# Patient Record
Sex: Female | Born: 1950 | Race: White | Hispanic: No | Marital: Married | Smoking: Former smoker
Health system: Southern US, Community
[De-identification: ages and names within clinical notes are randomized; demographics above are authoritative.]

## PROBLEM LIST (undated history)

## (undated) DIAGNOSIS — D649 Anemia, unspecified: Secondary | ICD-10-CM

## (undated) DIAGNOSIS — M869 Osteomyelitis, unspecified: Secondary | ICD-10-CM

## (undated) DIAGNOSIS — R011 Cardiac murmur, unspecified: Secondary | ICD-10-CM

## (undated) DIAGNOSIS — M1990 Unspecified osteoarthritis, unspecified site: Secondary | ICD-10-CM

## (undated) DIAGNOSIS — Z8489 Family history of other specified conditions: Secondary | ICD-10-CM

## (undated) DIAGNOSIS — I10 Essential (primary) hypertension: Secondary | ICD-10-CM

## (undated) DIAGNOSIS — G43909 Migraine, unspecified, not intractable, without status migrainosus: Secondary | ICD-10-CM

## (undated) DIAGNOSIS — M797 Fibromyalgia: Secondary | ICD-10-CM

## (undated) DIAGNOSIS — M51369 Other intervertebral disc degeneration, lumbar region without mention of lumbar back pain or lower extremity pain: Secondary | ICD-10-CM

## (undated) DIAGNOSIS — R7303 Prediabetes: Secondary | ICD-10-CM

## (undated) DIAGNOSIS — F429 Obsessive-compulsive disorder, unspecified: Secondary | ICD-10-CM

## (undated) DIAGNOSIS — K219 Gastro-esophageal reflux disease without esophagitis: Secondary | ICD-10-CM

## (undated) DIAGNOSIS — M5136 Other intervertebral disc degeneration, lumbar region: Secondary | ICD-10-CM

## (undated) HISTORY — DX: Fibromyalgia: M79.7

## (undated) HISTORY — PX: CATARACT EXTRACTION: SUR2

## (undated) HISTORY — PX: KNEE ARTHROSCOPY W/ MENISCAL REPAIR: SHX1877

## (undated) HISTORY — DX: Anemia, unspecified: D64.9

## (undated) HISTORY — PX: RADIOFREQUENCY ABLATION NERVES: SUR1070

## (undated) HISTORY — DX: Other intervertebral disc degeneration, lumbar region: M51.36

## (undated) HISTORY — DX: Migraine, unspecified, not intractable, without status migrainosus: G43.909

## (undated) HISTORY — DX: Unspecified osteoarthritis, unspecified site: M19.90

## (undated) HISTORY — DX: Other intervertebral disc degeneration, lumbar region without mention of lumbar back pain or lower extremity pain: M51.369

---

## 1972-07-20 HISTORY — PX: TONSILLECTOMY: SUR1361

## 1993-07-20 HISTORY — PX: BREAST BIOPSY: SHX20

## 1998-02-12 ENCOUNTER — Other Ambulatory Visit: Admission: RE | Admit: 1998-02-12 | Discharge: 1998-02-12 | Payer: Self-pay | Admitting: Gynecology

## 1999-02-25 ENCOUNTER — Other Ambulatory Visit: Admission: RE | Admit: 1999-02-25 | Discharge: 1999-02-25 | Payer: Self-pay | Admitting: Gynecology

## 2000-02-26 ENCOUNTER — Other Ambulatory Visit: Admission: RE | Admit: 2000-02-26 | Discharge: 2000-02-26 | Payer: Self-pay | Admitting: Gynecology

## 2001-02-08 ENCOUNTER — Other Ambulatory Visit: Admission: RE | Admit: 2001-02-08 | Discharge: 2001-02-08 | Payer: Self-pay | Admitting: Gynecology

## 2001-06-10 ENCOUNTER — Ambulatory Visit (HOSPITAL_COMMUNITY): Admission: RE | Admit: 2001-06-10 | Discharge: 2001-06-10 | Payer: Self-pay | Admitting: Pulmonary Disease

## 2001-11-25 ENCOUNTER — Emergency Department (HOSPITAL_COMMUNITY): Admission: EM | Admit: 2001-11-25 | Discharge: 2001-11-26 | Payer: Self-pay

## 2002-02-23 ENCOUNTER — Other Ambulatory Visit: Admission: RE | Admit: 2002-02-23 | Discharge: 2002-02-23 | Payer: Self-pay | Admitting: Obstetrics and Gynecology

## 2003-03-01 ENCOUNTER — Other Ambulatory Visit: Admission: RE | Admit: 2003-03-01 | Discharge: 2003-03-01 | Payer: Self-pay | Admitting: Gynecology

## 2004-03-13 ENCOUNTER — Other Ambulatory Visit: Admission: RE | Admit: 2004-03-13 | Discharge: 2004-03-13 | Payer: Self-pay | Admitting: Gynecology

## 2004-04-11 ENCOUNTER — Ambulatory Visit (HOSPITAL_COMMUNITY): Admission: RE | Admit: 2004-04-11 | Discharge: 2004-04-11 | Payer: Self-pay | Admitting: Pulmonary Disease

## 2004-04-21 ENCOUNTER — Ambulatory Visit (HOSPITAL_COMMUNITY): Admission: RE | Admit: 2004-04-21 | Discharge: 2004-04-21 | Payer: Self-pay | Admitting: Pulmonary Disease

## 2004-06-26 ENCOUNTER — Ambulatory Visit (HOSPITAL_COMMUNITY): Admission: RE | Admit: 2004-06-26 | Discharge: 2004-06-26 | Payer: Self-pay | Admitting: General Surgery

## 2004-07-08 ENCOUNTER — Encounter (HOSPITAL_COMMUNITY): Admission: RE | Admit: 2004-07-08 | Discharge: 2004-08-07 | Payer: Self-pay | Admitting: Neurosurgery

## 2008-04-23 ENCOUNTER — Ambulatory Visit (HOSPITAL_COMMUNITY): Admission: RE | Admit: 2008-04-23 | Discharge: 2008-04-23 | Payer: Self-pay | Admitting: Gynecology

## 2010-08-11 ENCOUNTER — Encounter: Payer: Self-pay | Admitting: Gynecology

## 2011-02-09 ENCOUNTER — Other Ambulatory Visit (HOSPITAL_COMMUNITY): Payer: Self-pay | Admitting: Gynecology

## 2011-02-09 DIAGNOSIS — Z139 Encounter for screening, unspecified: Secondary | ICD-10-CM

## 2011-02-16 ENCOUNTER — Ambulatory Visit (HOSPITAL_COMMUNITY)
Admission: RE | Admit: 2011-02-16 | Discharge: 2011-02-16 | Disposition: A | Discharge status: Home / Self Care | Payer: BC Managed Care – PPO | Source: Ambulatory Visit | Attending: Gynecology | Admitting: Gynecology

## 2011-02-16 DIAGNOSIS — Z139 Encounter for screening, unspecified: Secondary | ICD-10-CM

## 2011-02-16 DIAGNOSIS — Z1231 Encounter for screening mammogram for malignant neoplasm of breast: Secondary | ICD-10-CM | POA: Insufficient documentation

## 2015-04-22 ENCOUNTER — Other Ambulatory Visit (HOSPITAL_COMMUNITY): Payer: Self-pay | Admitting: Pulmonary Disease

## 2015-04-22 DIAGNOSIS — Z1231 Encounter for screening mammogram for malignant neoplasm of breast: Secondary | ICD-10-CM

## 2015-05-06 ENCOUNTER — Ambulatory Visit (HOSPITAL_COMMUNITY): Payer: Self-pay

## 2015-05-13 ENCOUNTER — Ambulatory Visit (HOSPITAL_COMMUNITY): Payer: Self-pay

## 2015-05-20 ENCOUNTER — Ambulatory Visit (HOSPITAL_COMMUNITY)
Admission: RE | Admit: 2015-05-20 | Discharge: 2015-05-20 | Disposition: A | Discharge status: Home / Self Care | Payer: BC Managed Care – PPO | Source: Ambulatory Visit | Attending: Pulmonary Disease | Admitting: Pulmonary Disease

## 2015-05-20 DIAGNOSIS — Z1231 Encounter for screening mammogram for malignant neoplasm of breast: Secondary | ICD-10-CM | POA: Diagnosis present

## 2016-07-08 ENCOUNTER — Other Ambulatory Visit (HOSPITAL_COMMUNITY): Payer: Self-pay | Admitting: Pulmonary Disease

## 2016-07-08 DIAGNOSIS — M5440 Lumbago with sciatica, unspecified side: Secondary | ICD-10-CM

## 2016-07-10 ENCOUNTER — Ambulatory Visit (HOSPITAL_COMMUNITY)
Admission: RE | Admit: 2016-07-10 | Discharge: 2016-07-10 | Disposition: A | Discharge status: Home / Self Care | Payer: BC Managed Care – PPO | Source: Ambulatory Visit | Attending: Pulmonary Disease | Admitting: Pulmonary Disease

## 2016-07-10 DIAGNOSIS — M545 Low back pain: Secondary | ICD-10-CM | POA: Diagnosis present

## 2016-07-10 DIAGNOSIS — M7138 Other bursal cyst, other site: Secondary | ICD-10-CM | POA: Insufficient documentation

## 2016-07-10 DIAGNOSIS — M5127 Other intervertebral disc displacement, lumbosacral region: Secondary | ICD-10-CM | POA: Insufficient documentation

## 2016-07-10 DIAGNOSIS — M4317 Spondylolisthesis, lumbosacral region: Secondary | ICD-10-CM | POA: Diagnosis not present

## 2016-07-10 DIAGNOSIS — M5440 Lumbago with sciatica, unspecified side: Secondary | ICD-10-CM

## 2016-07-10 DIAGNOSIS — M4807 Spinal stenosis, lumbosacral region: Secondary | ICD-10-CM | POA: Diagnosis not present

## 2016-07-20 HISTORY — PX: ANKLE FRACTURE SURGERY: SHX122

## 2016-07-31 ENCOUNTER — Other Ambulatory Visit (HOSPITAL_COMMUNITY): Payer: Self-pay | Admitting: Neurosurgery

## 2016-07-31 DIAGNOSIS — M5412 Radiculopathy, cervical region: Secondary | ICD-10-CM

## 2016-07-31 DIAGNOSIS — M4714 Other spondylosis with myelopathy, thoracic region: Secondary | ICD-10-CM

## 2016-08-04 ENCOUNTER — Ambulatory Visit (HOSPITAL_COMMUNITY)
Admission: RE | Admit: 2016-08-04 | Discharge: 2016-08-04 | Disposition: A | Discharge status: Home / Self Care | Payer: BC Managed Care – PPO | Source: Ambulatory Visit | Attending: Neurosurgery | Admitting: Neurosurgery

## 2016-08-04 ENCOUNTER — Ambulatory Visit (HOSPITAL_COMMUNITY): Payer: BC Managed Care – PPO

## 2016-08-04 DIAGNOSIS — M4312 Spondylolisthesis, cervical region: Secondary | ICD-10-CM | POA: Insufficient documentation

## 2016-08-04 DIAGNOSIS — M4802 Spinal stenosis, cervical region: Secondary | ICD-10-CM | POA: Diagnosis not present

## 2016-08-04 DIAGNOSIS — M4714 Other spondylosis with myelopathy, thoracic region: Secondary | ICD-10-CM | POA: Diagnosis not present

## 2016-08-04 DIAGNOSIS — M4804 Spinal stenosis, thoracic region: Secondary | ICD-10-CM | POA: Insufficient documentation

## 2016-08-04 DIAGNOSIS — M5412 Radiculopathy, cervical region: Secondary | ICD-10-CM | POA: Insufficient documentation

## 2016-09-10 ENCOUNTER — Other Ambulatory Visit (HOSPITAL_COMMUNITY): Payer: Self-pay | Admitting: *Deleted

## 2016-09-10 ENCOUNTER — Other Ambulatory Visit (HOSPITAL_COMMUNITY): Payer: Self-pay | Admitting: Neurosurgery

## 2016-09-10 DIAGNOSIS — R29898 Other symptoms and signs involving the musculoskeletal system: Secondary | ICD-10-CM

## 2016-09-15 ENCOUNTER — Ambulatory Visit (HOSPITAL_COMMUNITY)
Admission: RE | Admit: 2016-09-15 | Discharge: 2016-09-15 | Disposition: A | Discharge status: Home / Self Care | Payer: BC Managed Care – PPO | Source: Ambulatory Visit | Attending: Neurosurgery | Admitting: Neurosurgery

## 2016-09-15 DIAGNOSIS — R29898 Other symptoms and signs involving the musculoskeletal system: Secondary | ICD-10-CM

## 2016-09-17 ENCOUNTER — Encounter (HOSPITAL_COMMUNITY): Payer: Self-pay

## 2016-09-17 ENCOUNTER — Emergency Department (HOSPITAL_COMMUNITY): Payer: BC Managed Care – PPO

## 2016-09-17 ENCOUNTER — Emergency Department (HOSPITAL_COMMUNITY)
Admission: EM | Admit: 2016-09-17 | Discharge: 2016-09-17 | Disposition: A | Discharge status: Home / Self Care | Payer: BC Managed Care – PPO | Attending: Emergency Medicine | Admitting: Emergency Medicine

## 2016-09-17 DIAGNOSIS — S82301A Unspecified fracture of lower end of right tibia, initial encounter for closed fracture: Secondary | ICD-10-CM | POA: Diagnosis not present

## 2016-09-17 DIAGNOSIS — Y999 Unspecified external cause status: Secondary | ICD-10-CM | POA: Diagnosis not present

## 2016-09-17 DIAGNOSIS — W19XXXA Unspecified fall, initial encounter: Secondary | ICD-10-CM | POA: Diagnosis not present

## 2016-09-17 DIAGNOSIS — Y939 Activity, unspecified: Secondary | ICD-10-CM | POA: Diagnosis not present

## 2016-09-17 DIAGNOSIS — Y929 Unspecified place or not applicable: Secondary | ICD-10-CM | POA: Diagnosis not present

## 2016-09-17 DIAGNOSIS — M436 Torticollis: Secondary | ICD-10-CM | POA: Insufficient documentation

## 2016-09-17 DIAGNOSIS — I10 Essential (primary) hypertension: Secondary | ICD-10-CM | POA: Insufficient documentation

## 2016-09-17 DIAGNOSIS — S8991XA Unspecified injury of right lower leg, initial encounter: Secondary | ICD-10-CM | POA: Diagnosis present

## 2016-09-17 HISTORY — DX: Essential (primary) hypertension: I10

## 2016-09-17 NOTE — ED Notes (Signed)
Pt informed Ivery QualeHobson Bryant, PA that her oxycodone that is prescribed to her is due at 1200. Ivery QualeHobson Bryant, PA instructed pt to take home dose

## 2016-09-17 NOTE — ED Provider Notes (Signed)
[facility] Provider Note   CSN: [id number] Arrival date & time: 09/17/16  1126     History   Chief Complaint Chief Complaint  Patient presents with  . Knee Pain    HPI Shawna Lewis is a 66 y.o. female.  Patient is a 66 year old female who presents to the emergency department with a complaint of right knee pain, right ankle pain, and lower back pain following a fall.  The patient states that she has had problems with her lower back for several years. She has been diagnosed in the past with degenerative disc disease issues. She has numbness and tingling and difficulty with sensation in her lower extremities. She walks with a walker. She has been evaluated by neurosurgeon since January of this year. The patient reports that she fell on last night and injured her lower back. She had another fall today and injured her knee and her ankle on the right side. She noted swelling and tenderness especially when attempting to apply weight she came to the emergency department for additional evaluation. She states that the numbness and tingling of her lower extremity seems to be worse since the fall. Was no loss of bowel or bladder function during the fall. The patient denies hitting her head. She denies any difficulty with breathing or chest discomfort.   The history is provided by the patient.    Past Medical History:  Diagnosis Date  . Hypertension     There are no active problems to display for this patient.   History reviewed. No pertinent surgical history.  OB History    No data available       Home Medications    Prior to Admission medications   Not on File    Family History No family history on file.  Social History Social History  Substance Use Topics  . Smoking status: Not on file  . Smokeless tobacco: Not on file  . Alcohol use Not on file     Allergies   Patient has no allergy information on record.   Review of Systems Review of Systems    Constitutional: Negative for activity change.       All ROS Neg except as noted in HPI  HENT: Negative for nosebleeds.   Eyes: Negative for photophobia and discharge.  Respiratory: Negative for cough, shortness of breath and wheezing.   Cardiovascular: Negative for chest pain and palpitations.  Gastrointestinal: Negative for abdominal pain and blood in stool.  Genitourinary: Negative for dysuria, frequency and hematuria.  Musculoskeletal: Positive for arthralgias, back pain and neck stiffness. Negative for neck pain.  Skin: Negative.   Neurological: Negative for dizziness, seizures and speech difficulty.  Psychiatric/Behavioral: Negative for confusion and hallucinations.     Physical Exam Updated Vital Signs BP 183/72 (BP Location: Right Arm)   Pulse 72   Temp 98.6 F (37 C) (Oral)   Resp 18   SpO2 97%   Physical Exam  Constitutional: She is oriented to person, place, and time. She appears well-developed and well-nourished.  Non-toxic appearance.  HENT:  Head: Normocephalic.  Right Ear: Tympanic membrane and external ear normal.  Left Ear: Tympanic membrane and external ear normal.  No scalp or 4 head hematoma appreciated. Negative battles sign.  Eyes: EOM and lids are normal. Pupils are equal, round, and reactive to light.  Neck: Normal range of motion. Neck supple. Carotid bruit is not present.  Cardiovascular: Normal rate, regular rhythm, normal heart sounds, intact distal pulses and normal pulses.  Pulmonary/Chest: Breath sounds normal. No respiratory distress.  Symmetrical rise and fall of the chest. Patient speaks in complete sentences without problem.  Abdominal: Soft. Bowel sounds are normal. There is no tenderness. There is no guarding.  Musculoskeletal: Normal range of motion.  There is no palpable step off of the cervical, thoracic, or lumbar spine. There is pain to palpation of the lower lumbar area, there is also tightness and tenseness with some spasm of the  paraspinal muscles in the lumbar region.  There is a minor abrasion of the right knee. There is no effusion noted of the knee. There is some soreness with movement of the knee, the patient states she has soreness frequently with her joints.  There is swelling of the lower tibial area extending into the right ankle. There is pain of the medial greater than lateral malleolus. There is good range of motion of the toes. Dorsalis pedis pulses 2+ bilaterally. There is a bruise to the lateral aspect of the right lower leg.  Lymphadenopathy:       Head (right side): No submandibular adenopathy present.       Head (left side): No submandibular adenopathy present.    She has no cervical adenopathy.  Neurological: She is alert and oriented to person, place, and time. She has normal strength. No cranial nerve deficit or sensory deficit.  Skin: Skin is warm and dry.  Psychiatric: She has a normal mood and affect. Her speech is normal.  Nursing note and vitals reviewed.    ED Treatments / Results  Labs (all labs ordered are listed, but only abnormal results are displayed) Labs Reviewed - No data to display  EKG  EKG Interpretation None       Radiology Dg Lumbar Spine Complete  Result Date: 09/17/2016 CLINICAL DATA:  Pain following fall EXAM: LUMBAR SPINE - COMPLETE 4+ VIEW COMPARISON:  Lumbar MRI July 10, 2016 FINDINGS: Frontal, lateral, spot lumbosacral lateral, and bilateral oblique views were obtained. There are 5 non-rib-bearing lumbar type vertebral bodies. There is thoracolumbar dextroscoliosis. There is no evident fracture. 3 mm of anterolisthesis of L5 on S1 remains. No other spondylolisthesis. There is marked disc space narrowing at T12-L1 and L1-2 with remodeling at T12 and L1 due to the longstanding scoliosis. There are prominent anterior osteophytes at T12, L1, L2, and L3. There is milder disc space narrowing at L3-4. There is facet osteoarthritic change to varying degrees at all  levels, most severe at L5-S1 bilaterally. There are scattered foci of aortic atherosclerotic calcification. IMPRESSION: Scoliosis with remodeling in the lower scope thoracic and upper lumbar regions. No acute fracture. Stable 3 mm of anterolisthesis of L5 on S1. No new spondylolisthesis. Multilevel osteoarthritic change. Aortic atherosclerosis noted. Electronically Signed   By: Bretta Bang III M.D.   On: 09/17/2016 12:45   Dg Ankle Complete Right  Result Date: 09/17/2016 CLINICAL DATA:  Pain following fall EXAM: RIGHT ANKLE - COMPLETE 3+ VIEW COMPARISON:  None. FINDINGS: Frontal, oblique, and lateral views were obtained. There is generalized soft tissue swelling. There is a fracture, obliquely oriented, arising along the anterior, medial aspect of the distal tibia with mild displacement of fracture fragments. This fracture extends into the medial aspect of the ankle joint with disruption of the plafond are medially in this area. No other fractures are evident. Ankle mortise appears grossly intact. There is mild generalized osteoarthritic change in the ankle joint. There is spurring in the dorsal midfoot. There are posterior and inferior calcaneal spurs. IMPRESSION: Fracture  anterior, medial aspect of the distal tibia with fracture fragment extending into the medial distal tibial plafond. There is diffuse soft tissue swelling. No other fracture. Ankle mortise appears grossly intact. There is spurring at multiple sites in the dorsal midfoot. There are calcaneal spurs posteriorly and inferiorly. There is mild ankle mortise osteoarthritic change. Electronically Signed   By: Bretta Bang III M.D.   On: 09/17/2016 12:42   Dg Knee Complete 4 Views Right  Result Date: 09/17/2016 CLINICAL DATA:  Leg numbness. EXAM: RIGHT KNEE - COMPLETE 4+ VIEW COMPARISON:  No recent prior . FINDINGS: Tricompartment degenerative change. No acute bony or joint abnormality identified. No evidence of fracture or dislocation .  IMPRESSION: Tricompartment degenerative change.  No acute abnormality. Electronically Signed   By: Maisie Fus  Register   On: 09/17/2016 12:40    Procedures Procedures (including critical care time) FRACTURE CARE RIGHT ANKLE Patient sustained a fall from a standing position on last night on. She complains of problems with numbness of her legs for quite some time. She noticed bruising and swelling and presented to the emergency department. X-ray reveals a fracture with mild displacement of the distal right tibia.  The fracture was explained to the patient in terms which he understands. I described the mobilization process to her. She gives permission to proceed.  The patient is identified by arm band. The patient is fitted with a cam walker. The patient used one of her own oxycodone tablets for pain. An ice pack is also provided for the patient. The patient tolerated the procedure without problem. Medications Ordered in ED Medications - No data to display   Initial Impression / Assessment and Plan / ED Course  I have reviewed the triage vital signs and the nursing notes.  Pertinent labs & imaging results that were available during my care of the patient were reviewed by me and considered in my medical decision making (see chart for details).     **I have reviewed nursing notes, vital signs, and all appropriate lab and imaging results for this patient.*  Final Clinical Impressions(s) / ED Diagnoses MDM Patient sustained a fall on last evening and injured her back. Patient sustained a fall today and injured her right knee and right ankle. X-ray of the right knee reveals tricompartment degenerative changes present. No acute abnormality noted. X-ray of the right ankle shows a fracture of the anterior medial aspect of the distal tibia. There is a fracture fragment that is extending into the medial distal tibial area. There is spurring at multiple sites of the dorsal midfoot. There is also a calcaneal  spur present. X-ray of the lumbar spine reveals some scoliosis present. There are no acute fractures appreciated. There are multiple areas of osteoarthritis and degenerative disc changes present.  I discussed the findings with the patient. I reviewed the MR is of the back. I reminded the patient of some nerve root issues related to the MR. The patient was fitted with a cam walker. I discussed the importance of elevation. She has a walker that she uses at home, and she has oxycodone that she uses for her ongoing pain. I've asked the patient to see orthopedics as soon as possible for evaluation and management of this issue. Patient and husband acknowledged understanding of these discharge instructions.    Final diagnoses:  Closed fracture of distal end of right tibia, unspecified fracture morphology, initial encounter    New Prescriptions New Prescriptions   No medications on file  Ivery QualeHobson Charm Stenner, PA-C 09/18/16 1138    Cathren LaineKevin Steinl, MD 09/18/16 [phone]

## 2016-09-17 NOTE — ED Triage Notes (Signed)
C/o rt knee and ankle pain after falling today. Pt reports she is seeing a neurologist new onset for le numbness in mid November. Pt reports she fell last night d/t to her leg numbness. C/o lower back as well. Pt recently had brain MRI d/t increase in falling.

## 2016-09-17 NOTE — Discharge Instructions (Signed)
I reviewed your MRI of your lumbar spine. There is multiple areas of degenerative joint disease present. And there is some nerve root irritation at the L5-S1 area. The x-ray today is negative for new fracture or dislocation. The x-ray of your ankle shows a fracture of the tibia. Please apply ice. Please continue your current pain medication. Please use elevation of your right ankle above your waist when sitting, and above your heart when lying down. Please see Dr. Romeo AppleHarrison, or the orthopedist of your choice for with feeding evaluation of your fracture. Use your cam walker and your 4 point walker when getting around. The x-ray of your knee on the right shows advanced degenerative joint disease (arthritis).

## 2016-09-21 ENCOUNTER — Other Ambulatory Visit (HOSPITAL_COMMUNITY): Payer: Self-pay | Admitting: Orthopedic Surgery

## 2016-09-21 DIAGNOSIS — M25571 Pain in right ankle and joints of right foot: Secondary | ICD-10-CM

## 2016-09-30 ENCOUNTER — Ambulatory Visit (HOSPITAL_COMMUNITY)
Admission: RE | Admit: 2016-09-30 | Discharge: 2016-09-30 | Disposition: A | Discharge status: Home / Self Care | Payer: BC Managed Care – PPO | Source: Ambulatory Visit | Attending: Orthopedic Surgery | Admitting: Orthopedic Surgery

## 2016-09-30 DIAGNOSIS — M19071 Primary osteoarthritis, right ankle and foot: Secondary | ICD-10-CM | POA: Diagnosis not present

## 2016-09-30 DIAGNOSIS — S8251XA Displaced fracture of medial malleolus of right tibia, initial encounter for closed fracture: Secondary | ICD-10-CM | POA: Diagnosis present

## 2016-09-30 DIAGNOSIS — S82841A Displaced bimalleolar fracture of right lower leg, initial encounter for closed fracture: Secondary | ICD-10-CM | POA: Diagnosis not present

## 2016-09-30 DIAGNOSIS — X58XXXA Exposure to other specified factors, initial encounter: Secondary | ICD-10-CM | POA: Insufficient documentation

## 2016-09-30 DIAGNOSIS — M25571 Pain in right ankle and joints of right foot: Secondary | ICD-10-CM

## 2016-11-10 ENCOUNTER — Ambulatory Visit: Payer: BC Managed Care – PPO | Admitting: Neurology

## 2016-11-10 ENCOUNTER — Encounter: Payer: Self-pay | Admitting: Neurology

## 2016-11-10 ENCOUNTER — Ambulatory Visit (INDEPENDENT_AMBULATORY_CARE_PROVIDER_SITE_OTHER): Payer: BC Managed Care – PPO | Admitting: Neurology

## 2016-11-10 VITALS — BP 160/82 | HR 69 | Ht 64.0 in | Wt 215.0 lb

## 2016-11-10 DIAGNOSIS — R200 Anesthesia of skin: Secondary | ICD-10-CM

## 2016-11-10 DIAGNOSIS — E519 Thiamine deficiency, unspecified: Secondary | ICD-10-CM | POA: Diagnosis not present

## 2016-11-10 DIAGNOSIS — R202 Paresthesia of skin: Secondary | ICD-10-CM | POA: Diagnosis not present

## 2016-11-10 DIAGNOSIS — E531 Pyridoxine deficiency: Secondary | ICD-10-CM | POA: Diagnosis not present

## 2016-11-10 DIAGNOSIS — R7309 Other abnormal glucose: Secondary | ICD-10-CM

## 2016-11-10 DIAGNOSIS — E538 Deficiency of other specified B group vitamins: Secondary | ICD-10-CM | POA: Diagnosis not present

## 2016-11-10 NOTE — Progress Notes (Addendum)
[facility]    Provider:  Dr Lucia Gaskins Referring Provider: Kari Baars, MD Primary Care Physician:  Fredirick Maudlin, MD  CC:  Leg pain  HPI:  Shawna Lewis is a 66 y.o. female here as a referral from Dr. Juanetta Gosling for leg pain. 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 She has had knee pain for over a decade and degenerative disk disease for just as long. She has swelling in the legs. In Mid November she started having tingling in the feet. She started having numbness in her legs. Started in both feet symemtrically. She had an emg/ncs with Dr. Lovell Sheehan and everything was fine. She had it in mid February. They did both legs with NCS and EMG. Everything was normal per patient (I do not have the emg/ncs, requested). She had blood work but unclear what was completed. Started on the bottom of her feet it was tingling. She felt off balance. There was no gradual increase it was acute and the legs were completely numb in the setting of increased leg swelling. She says her legs are completely numb from the hips below in no specific pattern, just all over. No dermatomal pattern just numb. Worse laying down. Better with getting up. The symptoms never go away. No pain. Having numbness and tinlgin in the fingers are well dig 4-5. Gait difficulty. No other focal neurologic deficits, associated symptoms, inciting events or modifiable factors.  Reviewed notes, labs and imaging from outside physicians, which showed:   Review primary care notes. She has arthritis in multiple joints, restless leg syndrome, chronic severe low back pain from her vertebral disc disease, hypertension, obsessive-compulsive disorder and obesity. She has numbness in both legs. Her legs are somewhat weak. She has pain that goes on her buttocks to the right. She is having a lot more pain in both knees and a lot  more trouble walking. She has not been able to lose any weight. She is doing okay with the restless leg. Pain medications are working but she is worried about the numbness. She can move her neck in a certain direction and her right arm gets numb. She has a long history of severe problems with her back. She was given a prednisone taper. She was also ordered an MRI of her lumbar spine. An MRI of her cervical spine was suggested as well. She was seen at [facility]. She completed her first cervical and thoracic MRI there. I don't have a report just notes from neurosurgery and spine, cervical MRI to Mr. to mild cervical kyphosis with some moderate spinal stenosis at C5-C6 she has diffuse spondylosis and foraminal stenosis. The thoracic MRI is unremarkable. It appears as though he is a ordered bilateral lower extremity EMGs.  Reviewed MRI of the lumbar spine 06/2016. Which showed dominant abnormality at L5-S1 were 3 mm anterolisthesis, advanced posterior element hypertrophy, central protrusion and small right-sided synovial cyst continued to moderate stenosis with bilateral L5 and S1 nerve root impingement.  Reviewed MRI of the brain 08/2016  images 07/2016 which was unremarkable  Reviewed MRI of the cervical spine 07/2016 which did show widespread cervical disease, moderate spinal stenosis and multilevel neural foraminal stenosis.  Review of Systems: Patient complains of symptoms per HPI as well as the following symptoms: no CP, no SOB. Pertinent negatives per HPI. All others negative.   Social History   Social History  . Marital status: Married    Spouse  name: N/A  . Number of children: 1  . Years of education: Master's   Occupational History  . [employer]    Social History Main Topics  . Smoking status: Former Games developer  . Smokeless tobacco: Never Used  . Alcohol use Yes     Comment: 1-2 times per yr  . Drug use: No  . Sexual activity: Not on file   Other  Topics Concern  . Not on file   Social History Narrative   Lives at home w/ her husband   Right-handed   Caffeine: 0-1 cup of tea per day    Family History  Problem Relation Age of Onset  . COPD Mother   . Heart disease Mother   . Lymphoma Mother   . Migraines Mother   . Pancreatic cancer Father   . Neuropathy Neg Hx     Past Medical History:  Diagnosis Date  . Arthritis   . DDD (degenerative disc disease), lumbar   . Fibromyalgia   . Hypertension   . Migraine     Past Surgical History:  Procedure Laterality Date  . ANKLE FRACTURE SURGERY  2018  . BREAST BIOPSY  1995  . CESAREAN SECTION  1986  . KNEE ARTHROSCOPY W/ MENISCAL REPAIR    . TONSILLECTOMY  1974    Current Outpatient Prescriptions  Medication Sig Dispense Refill  . aspirin EC 81 MG tablet Take 81 mg by mouth daily.    . diclofenac (VOLTAREN) 75 MG EC tablet Take 75 mg by mouth daily.     . felodipine (PLENDIL) 2.5 MG 24 hr tablet Take 2.5 mg by mouth daily.     . fluvoxaMINE (LUVOX) 100 MG tablet Take 100 mg by mouth at bedtime.     . FUROSEMIDE PO Take by mouth daily as needed.    Marland Kitchen oxycodone (ROXICODONE) 30 MG immediate release tablet Take 60 mg by mouth daily.     . pramipexole (MIRAPEX) 0.25 MG tablet Take 0.25 mg by mouth daily.     No current facility-administered medications for this visit.     Allergies as of 11/10/2016 - Review Complete 11/10/2016  Allergen Reaction Noted  . Latex  11/10/2016    Vitals: BP (!) 160/82   Pulse 69   Ht  (1.626 m)   Wt 215 lb (97.5 kg)   BMI 36.90 kg/m  Last Weight:  Wt Readings from Last 1 Encounters:  11/10/16 215 lb (97.5 kg)   Last Height:   Ht Readings from Last 1 Encounters:  11/10/16  (1.626 m)     Right cast to the knees Good reflexes  Physical exam: Exam: Gen: NAD, conversant, well nourised, morbidly obese, well groomed                     CV: RRR, no MRG. No Carotid Bruits. + edema lower extremities, warm,  nontender Eyes: Conjunctivae clear without exudates or hemorrhage  Neuro: Detailed Neurologic Exam  Speech:    Speech is normal; fluent and spontaneous with normal comprehension.  Cognition:    The patient is oriented to person, place, and time;     recent and remote memory intact;     language fluent;     normal attention, concentration,     fund of knowledge Cranial Nerves:    The pupils are equal, round, and reactive to light. Funduscopic exam could not visualize due to small pupils. Visual fields are full to finger confrontation. Extraocular movements  are intact. Trigeminal sensation is intact and the muscles of mastication are normal. The face is symmetric. The palate elevates in the midline. Hearing intact. Voice is normal. Shoulder shrug is normal. The tongue has normal motion without fasciculations.   Coordination:    No dysmetria  Gait:    In a wheelchair due to ankle cast. Can bear weight independently.  Motor Observation:    No asymmetry, no atrophy, and no involuntary movements noted. Tone:    Normal muscle tone.    Posture:    Posture is normal. normal erect    Strength:    Strength is V/V in the upper and lower limbs.      Sensation: intact pin prick distally, decreased temperature distally, intact vibration, dec prioprioception left great toe (right great toe intact)     Reflex Exam:  DTR's:    Deep tendon reflexes in the upper and lower extremities are normal bilaterally.  Cannot erform AJ in the right ankle due to cast.   Toes:    The left toes are downgoingcannot perform on the right foot due to cast.   Clonus:    Clonus is absent.      Assessment/Plan:  This is a lovely 66 year old female with a past medical history of degenerative lumbosacral spine disease, osteoarthritis. Patient is here for subacute onset of bilateral leg numbness in the setting of increased lower extremity edema. Reportedly EMG nerve conduction study was normal but I don't have  those records and am requesting that; emg/ncs was completed in February so we will repeat for any progression. She does have moderate lumbar stenosis with bilateral L5 and S1 nerve root impingement and chronic low back pain along with the worsened edema and morbid obesity could be contributing to her symptoms. Also a possibility is cervical spinal stenosis at C5-C6. We'll also perform an extensive serum neuropathy screen.  Addendum: See scanned previous emg/ncs report, reviewed and unremarkable  Cc: Kari Baars, MD  Naomie Dean, MD  [facility] [address] [place], [place] [address]  Phone [phone] Fax [phone]

## 2016-11-10 NOTE — Patient Instructions (Addendum)
Remember to drink plenty of fluid, eat healthy meals and do not skip any meals. Try to eat protein with a every meal and eat a healthy snack such as fruit or nuts in between meals. Try to keep a regular sleep-wake schedule and try to exercise daily, particularly in the form of walking, 20-30 minutes a day, if you can.   As far as your medications are concerned, I would like to suggest: alpha-lipoic acide 400-600mg /day  As far as diagnostic testing: emg/ncs, labs and then maybe biopsy  I would like to see you back for emg/ncs, sooner if we need to. Please call us with any interim questions, concerns, problems, updates or refill requests.   Our phone number is [phone]. We also have an after hours call service for urgent matters and there is a physician on-call for urgent questions. For any emergencies you know to call 911 or go to the nearest emergency room

## 2016-11-12 ENCOUNTER — Encounter: Payer: Self-pay | Admitting: Neurology

## 2016-11-16 ENCOUNTER — Telehealth: Payer: Self-pay

## 2016-11-16 NOTE — Telephone Encounter (Signed)
Received faxed notes from [place] and results of cervical and thoracic MRI performed 08/04/16 @ [facility] which "demonstrated a mild cervical kyphosis with some moderate spinal stenosis at C5-C6, she has diffuse spondylosis and foraminal stenosis as well as of NCV/EMG done 09/03/16 from Dr. 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 It did not reveal any evidence of a polyneuropathy." Given to Dr. Lucia Gaskins for review.

## 2016-12-02 ENCOUNTER — Ambulatory Visit (INDEPENDENT_AMBULATORY_CARE_PROVIDER_SITE_OTHER): Payer: Self-pay | Admitting: Neurology

## 2016-12-02 ENCOUNTER — Ambulatory Visit (INDEPENDENT_AMBULATORY_CARE_PROVIDER_SITE_OTHER): Payer: BC Managed Care – PPO | Admitting: Neurology

## 2016-12-02 DIAGNOSIS — E538 Deficiency of other specified B group vitamins: Secondary | ICD-10-CM

## 2016-12-02 DIAGNOSIS — R202 Paresthesia of skin: Secondary | ICD-10-CM

## 2016-12-02 DIAGNOSIS — E531 Pyridoxine deficiency: Secondary | ICD-10-CM

## 2016-12-02 DIAGNOSIS — R7309 Other abnormal glucose: Secondary | ICD-10-CM

## 2016-12-02 DIAGNOSIS — G629 Polyneuropathy, unspecified: Secondary | ICD-10-CM

## 2016-12-02 DIAGNOSIS — R200 Anesthesia of skin: Secondary | ICD-10-CM

## 2016-12-02 DIAGNOSIS — E519 Thiamine deficiency, unspecified: Secondary | ICD-10-CM

## 2016-12-02 DIAGNOSIS — Z0289 Encounter for other administrative examinations: Secondary | ICD-10-CM

## 2016-12-02 NOTE — Progress Notes (Signed)
Full Name: Shawna Lewis Gender: Female MRN #: [id number] Date of Birth: 1951-06-19    Visit Date: 12/02/2016 10:41 Age: 66 Years 11 Months Old Examining Physician: Naomie Dean, MD  Referring Physician: Juanetta Gosling, MD  History: This is a lovely 66 year old female with a past medical history of degenerative lumbosacral spine disease, osteoarthritis. Patient is here for subacute onset of bilateral leg numbness in the setting of increased lower extremity edema.   Summary: This was a technically difficult exam due to morbid obesity and significant edema. The right peroneal motor nerve showed decreased amplitude (0.5 mV, N>2). The left peroneal motor nerve showed decreased amplitude (0.4 mV, N>2) and no response at the fibular head. Bilateral sural sensory nerves showed decreased amplitude (4 V, N> 6) and the bilateral superficial peroneal sensory nerves were absent. The left tibial F-wave showed no response.   The following muscles were normal on EMG needle study: Left vastus medialis, left anterior tibialis, left medial gastrocnemius, left extensor hallucis and left abductor hallucis.  Conclusion: This was a technically difficult exam due to morbid obesity and significant edema. There may be evidence of sensorimotor axonal neuropathy however decreased sensory and motor nerve conduction amplitudes may be due to technical difficulties as stated above. No evidence for demyelinating neuropathy.    Naomie Dean, M.D.  [facility] [address] [place], [place] [address] Tel: [phone] Fax: [phone]        [facility]    Nerve / Sites Muscle Latency Ref. Amplitude Ref. Rel Amp Segments Distance Velocity Ref. Area    ms ms mV mV %  cm m/s m/s mVms  R Peroneal - EDB     Ankle EDB 5.6 ?6.5 0.5 ?2.0 100 Ankle - EDB 9   1.8     Fib head EDB 13.4  0.4  68.2 Fib head - Ankle 28 36 ?44 1.5     Pop fossa EDB      Pop fossa - Fib head   ?44          Pop fossa - Ankle      L  Peroneal - EDB     Ankle EDB 5.1 ?6.5 0.4 ?2.0 100 Ankle - EDB 9   1.6     Fib head EDB NR  NR  NR Fib head - Ankle   ?44 NR         Pop fossa - Ankle      R Tibial - AH     Ankle AH 3.9 ?5.8 4.8 ?4.0 100 Ankle - AH 9   9.9     Pop fossa AH 12.4  2.3  47.4 Pop fossa - Ankle 36 42 ?41 7.4  L Tibial - AH     Ankle AH 5.3 ?5.8 4.9 ?4.0 100 Ankle - AH 9   9.4     Pop fossa AH 14.2  3.9  80.3 Pop fossa - Ankle 36 41 ?41 10.9             SNC    Nerve / Sites Rec. Site Peak Lat Ref.  Amp Ref. Segments Distance    ms ms V V  cm  R Sural - Ankle (Calf)     Calf Ankle 3.6 ?4.4 4 ?6 Calf - Ankle 14  L Sural - Ankle (Calf)     Calf Ankle 3.2 ?4.4 4 ?6 Calf - Ankle 14  R Superficial peroneal - Ankle     Lat leg Ankle NR ?4.4  NR ?6 Lat leg - Ankle 14  L Superficial peroneal - Ankle     Lat leg Ankle NR ?4.4 NR ?6 Lat leg - Ankle 14             F  Wave    Nerve F Lat Ref.   ms ms  R Tibial - AH 52.4 ?56.0  L Tibial - AH NR ?56.0

## 2016-12-02 NOTE — Progress Notes (Signed)
See procedure note.

## 2016-12-07 LAB — HEMOGLOBIN A1C
Est. average glucose Bld gHb Est-mCnc: 120 mg/dL
HEMOGLOBIN A1C: 5.8 % — AB (ref 4.8–5.6)

## 2016-12-07 LAB — HEAVY METALS, BLOOD
ARSENIC: 8 ug/L (ref 2–23)
LEAD, BLOOD: NOT DETECTED ug/dL (ref 0–19)
MERCURY: NOT DETECTED ug/L (ref 0.0–14.9)

## 2016-12-07 LAB — B12 AND FOLATE PANEL
FOLATE: 12.3 ng/mL (ref >3.0)
VITAMIN B 12: 878 pg/mL (ref 232–1245)

## 2016-12-07 LAB — MULTIPLE MYELOMA PANEL, SERUM
ALBUMIN SERPL ELPH-MCNC: 4.1 g/dL (ref 2.9–4.4)
ALPHA 1: 0.2 g/dL (ref 0.0–0.4)
Albumin/Glob SerPl: 1.4 (ref 0.7–1.7)
Alpha2 Glob SerPl Elph-Mcnc: 0.7 g/dL (ref 0.4–1.0)
B-Globulin SerPl Elph-Mcnc: 1.3 g/dL (ref 0.7–1.3)
GLOBULIN, TOTAL: 3.1 g/dL (ref 2.2–3.9)
Gamma Glob SerPl Elph-Mcnc: 1 g/dL (ref 0.4–1.8)
IGA/IMMUNOGLOBULIN A, SERUM: 280 mg/dL (ref 87–352)
IGM (IMMUNOGLOBULIN M), SRM: 116 mg/dL (ref 26–217)
IgG (Immunoglobin G), Serum: 935 mg/dL (ref 700–1600)
TOTAL PROTEIN: 7.2 g/dL (ref 6.0–8.5)

## 2016-12-07 LAB — SJOGREN'S SYNDROME ANTIBODS(SSA + SSB): ENA SSA (RO) Ab: 0.2 AI (ref 0.0–0.9)

## 2016-12-07 LAB — VITAMIN B6: VITAMIN B6: 11.7 ug/L (ref 2.0–32.8)

## 2016-12-07 LAB — B. BURGDORFI ANTIBODIES

## 2016-12-07 LAB — METHYLMALONIC ACID, SERUM: Methylmalonic Acid: 103 nmol/L (ref 0–378)

## 2016-12-07 LAB — VITAMIN B1: Thiamine: 116.6 nmol/L (ref 66.5–200.0)

## 2016-12-07 LAB — TSH: TSH: 1.36 u[IU]/mL (ref 0.450–4.500)

## 2016-12-07 LAB — ANGIOTENSIN CONVERTING ENZYME: Angio Convert Enzyme: 34 U/L (ref 14–82)

## 2016-12-08 ENCOUNTER — Telehealth: Payer: Self-pay | Admitting: *Deleted

## 2016-12-08 NOTE — Telephone Encounter (Signed)
Follow up with NP in 6 months to monitor thanks!

## 2016-12-08 NOTE — Telephone Encounter (Signed)
Spoke with patient and informed her that her labs are normal with the exception of Hgb A1C which is mildly elevated. Advised she have her PCP follow. Patient inquired what was next. This RN discussed that she has had her labs and EMG/NCS study done, no other information in Dr Trevor MaceAhern's office note. Patient stated she would like to know if she should come in to discuss and review results or monitor symptoms. She stated her neuropathy is somewhat  improving. This RN stated she would route her question to Dr Lucia GaskinsAhern, and she will get a call back. Patient verbalized understanding, appreciation.

## 2016-12-09 NOTE — Procedures (Signed)
Full Name: Shawna Lewis Gender: Female MRN #: [id number] Date of Birth: 1951-07-17    Visit Date: 12/02/2016 10:41 Age: 66 Years 11 Months Old Examining Physician: Naomie Dean, MD  Referring Physician: Juanetta Gosling, MD  History: This is a lovely 65 year old female with a past medical history of degenerative lumbosacral spine disease, osteoarthritis. Patient is here for subacute onset of bilateral symmetric leg numbness in the setting of increased lower extremity edema.   Summary: This was a technically difficult exam due to morbid obesity and significant edema. The right peroneal motor nerve showed decreased amplitude (0.5 mV, N>2). The left peroneal motor nerve showed decreased amplitude (0.4 mV, N>2) and no response at the fibular head. Bilateral sural sensory nerves showed decreased amplitude (4 V, N> 6) and the bilateral superficial peroneal sensory nerves were absent. The left tibial F-wave showed no response.   The following muscles were normal on EMG needle study: Left vastus medialis, left anterior tibialis, left medial gastrocnemius, left extensor hallucis and left abductor hallucis.  Conclusion: This was a technically difficult exam due to morbid obesity and significant edema. There may be evidence of sensorimotor axonal neuropathy however decreased sensory and motor nerve conduction amplitudes may be due to technical difficulties as stated above. No evidence for demyelinating neuropathy.    Naomie Dean, M.D.  [facility] [address] [place], [place] [address] Tel: [phone] Fax: [phone]        [facility]    Nerve / Sites Muscle Latency Ref. Amplitude Ref. Rel Amp Segments Distance Velocity Ref. Area    ms ms mV mV %  cm m/s m/s mVms  R Peroneal - EDB     Ankle EDB 5.6 ?6.5 0.5 ?2.0 100 Ankle - EDB 9   1.8     Fib head EDB 13.4  0.4  68.2 Fib head - Ankle 28 36 ?44 1.5     Pop fossa EDB      Pop fossa - Fib head   ?44          Pop fossa - Ankle        L Peroneal - EDB     Ankle EDB 5.1 ?6.5 0.4 ?2.0 100 Ankle - EDB 9   1.6     Fib head EDB NR  NR  NR Fib head - Ankle   ?44 NR         Pop fossa - Ankle      R Tibial - AH     Ankle AH 3.9 ?5.8 4.8 ?4.0 100 Ankle - AH 9   9.9     Pop fossa AH 12.4  2.3  47.4 Pop fossa - Ankle 36 42 ?41 7.4  L Tibial - AH     Ankle AH 5.3 ?5.8 4.9 ?4.0 100 Ankle - AH 9   9.4     Pop fossa AH 14.2  3.9  80.3 Pop fossa - Ankle 36 41 ?41 10.9             SNC    Nerve / Sites Rec. Site Peak Lat Ref.  Amp Ref. Segments Distance    ms ms V V  cm  R Sural - Ankle (Calf)     Calf Ankle 3.6 ?4.4 4 ?6 Calf - Ankle 14  L Sural - Ankle (Calf)     Calf Ankle 3.2 ?4.4 4 ?6 Calf - Ankle 14  R Superficial peroneal - Ankle     Lat leg Ankle  NR ?4.4 NR ?6 Lat leg - Ankle 14  L Superficial peroneal - Ankle     Lat leg Ankle NR ?4.4 NR ?6 Lat leg - Ankle 14             F  Wave    Nerve F Lat Ref.   ms ms  R Tibial - AH 52.4 ?56.0  L Tibial - AH NR ?56.0

## 2016-12-10 NOTE — Telephone Encounter (Signed)
Called pt to offer 6 month follow-up appt w/ Eber Jonesarolyn NP. Pt says that she'll call back to schedule. 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 Agreed to call w/ any new or worsening symptoms. Voice appreciation for call.

## 2016-12-10 NOTE — Telephone Encounter (Signed)
yes

## 2016-12-10 NOTE — Telephone Encounter (Signed)
When patient calls back, she can be scheduled with Eber Jonesarolyn NP for 6 mo f/u of neuropathy at the end of October or November.

## 2018-07-26 ENCOUNTER — Other Ambulatory Visit (HOSPITAL_COMMUNITY): Payer: Self-pay | Admitting: Pulmonary Disease

## 2018-07-26 ENCOUNTER — Other Ambulatory Visit: Payer: Self-pay | Admitting: Pulmonary Disease

## 2018-07-26 DIAGNOSIS — M545 Low back pain, unspecified: Secondary | ICD-10-CM

## 2018-08-02 ENCOUNTER — Ambulatory Visit (HOSPITAL_COMMUNITY)
Admission: RE | Admit: 2018-08-02 | Discharge: 2018-08-02 | Disposition: A | Discharge status: Home / Self Care | Payer: Medicare Other | Source: Ambulatory Visit | Attending: Pulmonary Disease | Admitting: Pulmonary Disease

## 2018-08-02 DIAGNOSIS — M545 Low back pain, unspecified: Secondary | ICD-10-CM

## 2019-06-08 ENCOUNTER — Other Ambulatory Visit (HOSPITAL_COMMUNITY): Payer: Self-pay | Admitting: Orthopaedic Surgery

## 2019-06-08 ENCOUNTER — Other Ambulatory Visit: Payer: Self-pay | Admitting: Orthopaedic Surgery

## 2019-06-08 DIAGNOSIS — M47896 Other spondylosis, lumbar region: Secondary | ICD-10-CM

## 2019-06-19 ENCOUNTER — Inpatient Hospital Stay (HOSPITAL_COMMUNITY)
Admission: RE | Admit: 2019-06-19 | Discharge: 2019-06-19 | Disposition: A | Discharge status: Home / Self Care | Payer: Medicare Other | Source: Ambulatory Visit | Attending: Orthopaedic Surgery | Admitting: Orthopaedic Surgery

## 2019-06-19 ENCOUNTER — Other Ambulatory Visit: Payer: Self-pay

## 2019-06-19 DIAGNOSIS — M47896 Other spondylosis, lumbar region: Secondary | ICD-10-CM | POA: Insufficient documentation

## 2019-06-19 LAB — POCT I-STAT CREATININE: Creatinine, Ser: 0.7 mg/dL (ref 0.44–1.00)

## 2019-06-19 MED ORDER — GADOBUTROL 1 MMOL/ML IV SOLN
7.0000 mL | Freq: Once | INTRAVENOUS | Status: AC | PRN
  Administered 2019-06-19: 7 mL via INTRAVENOUS

## 2019-06-20 ENCOUNTER — Inpatient Hospital Stay (HOSPITAL_COMMUNITY)
Admission: EM | Admit: 2019-06-20 | Discharge: 2019-06-25 | DRG: 552 | Disposition: A | Discharge status: Home Health | Payer: Medicare Other | Attending: Internal Medicine | Admitting: Internal Medicine

## 2019-06-20 ENCOUNTER — Other Ambulatory Visit: Payer: Self-pay

## 2019-06-20 ENCOUNTER — Encounter (HOSPITAL_COMMUNITY): Payer: Self-pay

## 2019-06-20 DIAGNOSIS — Z95828 Presence of other vascular implants and grafts: Secondary | ICD-10-CM | POA: Diagnosis not present

## 2019-06-20 DIAGNOSIS — M4640 Discitis, unspecified, site unspecified: Secondary | ICD-10-CM | POA: Diagnosis present

## 2019-06-20 DIAGNOSIS — M545 Low back pain, unspecified: Secondary | ICD-10-CM | POA: Diagnosis present

## 2019-06-20 DIAGNOSIS — M797 Fibromyalgia: Secondary | ICD-10-CM | POA: Diagnosis present

## 2019-06-20 DIAGNOSIS — Z91040 Latex allergy status: Secondary | ICD-10-CM | POA: Diagnosis not present

## 2019-06-20 DIAGNOSIS — F419 Anxiety disorder, unspecified: Secondary | ICD-10-CM | POA: Diagnosis present

## 2019-06-20 DIAGNOSIS — M4800 Spinal stenosis, site unspecified: Secondary | ICD-10-CM | POA: Diagnosis present

## 2019-06-20 DIAGNOSIS — Z9181 History of falling: Secondary | ICD-10-CM | POA: Diagnosis not present

## 2019-06-20 DIAGNOSIS — Z7982 Long term (current) use of aspirin: Secondary | ICD-10-CM | POA: Diagnosis not present

## 2019-06-20 DIAGNOSIS — G8929 Other chronic pain: Secondary | ICD-10-CM | POA: Diagnosis not present

## 2019-06-20 DIAGNOSIS — Z807 Family history of other malignant neoplasms of lymphoid, hematopoietic and related tissues: Secondary | ICD-10-CM | POA: Diagnosis not present

## 2019-06-20 DIAGNOSIS — G061 Intraspinal abscess and granuloma: Secondary | ICD-10-CM | POA: Diagnosis not present

## 2019-06-20 DIAGNOSIS — I10 Essential (primary) hypertension: Secondary | ICD-10-CM | POA: Diagnosis present

## 2019-06-20 DIAGNOSIS — R011 Cardiac murmur, unspecified: Secondary | ICD-10-CM | POA: Diagnosis not present

## 2019-06-20 DIAGNOSIS — M5136 Other intervertebral disc degeneration, lumbar region: Secondary | ICD-10-CM | POA: Diagnosis present

## 2019-06-20 DIAGNOSIS — Z87891 Personal history of nicotine dependence: Secondary | ICD-10-CM

## 2019-06-20 DIAGNOSIS — E669 Obesity, unspecified: Secondary | ICD-10-CM | POA: Diagnosis present

## 2019-06-20 DIAGNOSIS — M48061 Spinal stenosis, lumbar region without neurogenic claudication: Secondary | ICD-10-CM | POA: Diagnosis present

## 2019-06-20 DIAGNOSIS — E876 Hypokalemia: Secondary | ICD-10-CM | POA: Diagnosis present

## 2019-06-20 DIAGNOSIS — M4626 Osteomyelitis of vertebra, lumbar region: Secondary | ICD-10-CM | POA: Diagnosis present

## 2019-06-20 DIAGNOSIS — M869 Osteomyelitis, unspecified: Secondary | ICD-10-CM | POA: Diagnosis not present

## 2019-06-20 DIAGNOSIS — G8921 Chronic pain due to trauma: Secondary | ICD-10-CM | POA: Diagnosis not present

## 2019-06-20 DIAGNOSIS — M4646 Discitis, unspecified, lumbar region: Principal | ICD-10-CM | POA: Diagnosis present

## 2019-06-20 DIAGNOSIS — M549 Dorsalgia, unspecified: Secondary | ICD-10-CM

## 2019-06-20 DIAGNOSIS — Z8249 Family history of ischemic heart disease and other diseases of the circulatory system: Secondary | ICD-10-CM | POA: Diagnosis not present

## 2019-06-20 DIAGNOSIS — Z79899 Other long term (current) drug therapy: Secondary | ICD-10-CM | POA: Diagnosis not present

## 2019-06-20 DIAGNOSIS — D649 Anemia, unspecified: Secondary | ICD-10-CM | POA: Diagnosis present

## 2019-06-20 DIAGNOSIS — G43809 Other migraine, not intractable, without status migrainosus: Secondary | ICD-10-CM

## 2019-06-20 DIAGNOSIS — G43909 Migraine, unspecified, not intractable, without status migrainosus: Secondary | ICD-10-CM | POA: Diagnosis present

## 2019-06-20 DIAGNOSIS — Z6837 Body mass index (BMI) 37.0-37.9, adult: Secondary | ICD-10-CM

## 2019-06-20 DIAGNOSIS — M5416 Radiculopathy, lumbar region: Secondary | ICD-10-CM | POA: Diagnosis not present

## 2019-06-20 DIAGNOSIS — M5442 Lumbago with sciatica, left side: Secondary | ICD-10-CM | POA: Diagnosis not present

## 2019-06-20 DIAGNOSIS — R7881 Bacteremia: Secondary | ICD-10-CM | POA: Diagnosis not present

## 2019-06-20 DIAGNOSIS — M51369 Other intervertebral disc degeneration, lumbar region without mention of lumbar back pain or lower extremity pain: Secondary | ICD-10-CM | POA: Diagnosis present

## 2019-06-20 DIAGNOSIS — F329 Major depressive disorder, single episode, unspecified: Secondary | ICD-10-CM | POA: Diagnosis present

## 2019-06-20 DIAGNOSIS — M1990 Unspecified osteoarthritis, unspecified site: Secondary | ICD-10-CM | POA: Diagnosis not present

## 2019-06-20 DIAGNOSIS — R739 Hyperglycemia, unspecified: Secondary | ICD-10-CM | POA: Diagnosis not present

## 2019-06-20 DIAGNOSIS — Z20828 Contact with and (suspected) exposure to other viral communicable diseases: Secondary | ICD-10-CM | POA: Diagnosis present

## 2019-06-20 DIAGNOSIS — R7303 Prediabetes: Secondary | ICD-10-CM | POA: Diagnosis present

## 2019-06-20 DIAGNOSIS — I509 Heart failure, unspecified: Secondary | ICD-10-CM

## 2019-06-20 DIAGNOSIS — Z825 Family history of asthma and other chronic lower respiratory diseases: Secondary | ICD-10-CM

## 2019-06-20 DIAGNOSIS — Z800 Family history of malignant neoplasm of digestive organs: Secondary | ICD-10-CM | POA: Diagnosis not present

## 2019-06-20 DIAGNOSIS — M5116 Intervertebral disc disorders with radiculopathy, lumbar region: Secondary | ICD-10-CM | POA: Diagnosis not present

## 2019-06-20 HISTORY — DX: Cardiac murmur, unspecified: R01.1

## 2019-06-20 LAB — LACTIC ACID, PLASMA: Lactic Acid, Venous: 1 mmol/L (ref 0.5–1.9)

## 2019-06-20 LAB — CBC WITH DIFFERENTIAL/PLATELET
Abs Immature Granulocytes: 0.01 10*3/uL (ref 0.00–0.07)
Basophils Absolute: 0.1 10*3/uL (ref 0.0–0.1)
Basophils Relative: 1 %
Eosinophils Absolute: 0.3 10*3/uL (ref 0.0–0.5)
Eosinophils Relative: 5 %
HCT: 39.7 % (ref 36.0–46.0)
Hemoglobin: 12.1 g/dL (ref 12.0–15.0)
Immature Granulocytes: 0 %
Lymphocytes Relative: 30 %
Lymphs Abs: 1.9 10*3/uL (ref 0.7–4.0)
MCH: 27.8 pg (ref 26.0–34.0)
MCHC: 30.5 g/dL (ref 30.0–36.0)
MCV: 91.1 fL (ref 80.0–100.0)
Monocytes Absolute: 0.4 10*3/uL (ref 0.1–1.0)
Monocytes Relative: 6 %
Neutro Abs: 3.6 10*3/uL (ref 1.7–7.7)
Neutrophils Relative %: 58 %
Platelets: 209 10*3/uL (ref 150–400)
RBC: 4.36 MIL/uL (ref 3.87–5.11)
RDW: 14.5 % (ref 11.5–15.5)
WBC: 6.3 10*3/uL (ref 4.0–10.5)
nRBC: 0 % (ref 0.0–0.2)

## 2019-06-20 LAB — COMPREHENSIVE METABOLIC PANEL
ALT: 27 U/L (ref 0–44)
AST: 29 U/L (ref 15–41)
Albumin: 4.5 g/dL (ref 3.5–5.0)
Alkaline Phosphatase: 71 U/L (ref 38–126)
Anion gap: 9 (ref 5–15)
BUN: 16 mg/dL (ref 8–23)
CO2: 26 mmol/L (ref 22–32)
Calcium: 9.3 mg/dL (ref 8.9–10.3)
Chloride: 105 mmol/L (ref 98–111)
Creatinine, Ser: 0.49 mg/dL (ref 0.44–1.00)
GFR calc Af Amer: >60 mL/min (ref >60)
GFR calc non Af Amer: >60 mL/min (ref >60)
Glucose, Bld: 109 mg/dL — ABNORMAL HIGH (ref 70–99)
Potassium: 4.2 mmol/L (ref 3.5–5.1)
Sodium: 140 mmol/L (ref 135–145)
Total Bilirubin: 0.3 mg/dL (ref 0.3–1.2)
Total Protein: 8 g/dL (ref 6.5–8.1)

## 2019-06-20 LAB — URINALYSIS, ROUTINE W REFLEX MICROSCOPIC
Bilirubin Urine: NEGATIVE
Glucose, UA: NEGATIVE mg/dL
Hgb urine dipstick: NEGATIVE
Ketones, ur: NEGATIVE mg/dL
Leukocytes,Ua: NEGATIVE
Nitrite: NEGATIVE
Protein, ur: NEGATIVE mg/dL
Specific Gravity, Urine: 1.01 (ref 1.005–1.030)
pH: 6 (ref 5.0–8.0)

## 2019-06-20 LAB — SEDIMENTATION RATE: Sed Rate: 22 mm/hr (ref 0–22)

## 2019-06-20 LAB — PROTIME-INR
INR: 1 (ref 0.8–1.2)
Prothrombin Time: 13.2 seconds (ref 11.4–15.2)

## 2019-06-20 LAB — C-REACTIVE PROTEIN: CRP: <0.8 mg/dL (ref <1.0)

## 2019-06-20 MED ORDER — SODIUM CHLORIDE 0.9 % IV SOLN
2.0000 g | Freq: Once | INTRAVENOUS | Status: DC
  Filled 2019-06-20: qty 20

## 2019-06-20 MED ORDER — ACETAMINOPHEN 325 MG PO TABS
650.0000 mg | ORAL_TABLET | Freq: Four times a day (QID) | ORAL | Status: DC | PRN
  Administered 2019-06-23: 650 mg via ORAL
  Filled 2019-06-20: qty 2

## 2019-06-20 MED ORDER — FENTANYL CITRATE (PF) 100 MCG/2ML IJ SOLN: 25.0000 ug | INTRAMUSCULAR | Status: DC | PRN

## 2019-06-20 MED ORDER — VANCOMYCIN HCL IN DEXTROSE 1-5 GM/200ML-% IV SOLN: 1000.0000 mg | INTRAVENOUS | Status: DC

## 2019-06-20 MED ORDER — ACETAMINOPHEN 650 MG RE SUPP: 650.0000 mg | Freq: Four times a day (QID) | RECTAL | Status: DC | PRN

## 2019-06-20 MED ORDER — FLUVOXAMINE MALEATE 100 MG PO TABS
100.0000 mg | ORAL_TABLET | Freq: Every day | ORAL | Status: DC
  Administered 2019-06-21 – 2019-06-24 (×4): 100 mg via ORAL
  Filled 2019-06-20 (×5): qty 1

## 2019-06-20 MED ORDER — SODIUM CHLORIDE 0.9% FLUSH
3.0000 mL | Freq: Two times a day (BID) | INTRAVENOUS | Status: DC
  Administered 2019-06-21 – 2019-06-25 (×9): 3 mL via INTRAVENOUS

## 2019-06-20 NOTE — ED Provider Notes (Signed)
[facility] EMERGENCY DEPARTMENT Provider Note   CSN: [id number] Arrival date & time: 06/20/19  1334     History   Chief Complaint Chief Complaint  Patient presents with  . Back Pain    HPI DEVONA HOLMES is a 68 y.o. female.     HPI   68 year old female with a history of arthritis, DDD, primary myalgia, hypertension, migraine who presents to the emergency department today for evaluation of back pain.  States that she fell about a year ago and since then has had pain in her lower back that radiates down the LLE. States that her pain has actually improved recently. She has been following with neuro and neurosurg and had xray in office and noticed abnormalities 05/2019 so proceeded with MRI which was done today and showed discitis/osteomyelitis therefore she was advised to come to the ED.   Pt reports persistent numbness to the BLE that started 3 years ago. This improved about 1.5 years ago and now has recurred and sxs "feels like a tight sock". She has had full w/u for this. Denies saddle anesthesia. Denies loss of control of bowels or bladder. No urinary retention. No fevers. Denies a h/o IVDU. Denies a h/o CA. Denies fevers, chills, sweats.  Neurosurgeon: Dr. Riley Nearing   ble numbess x3 days, "feels like a tight sock" eval by  Neurosurgeon max cohen Neuro dr Lucia Gaskins  Past Medical History:  Diagnosis Date  . Arthritis   . DDD (degenerative disc disease), lumbar   . Fibromyalgia   . Hypertension   . Migraine     Patient Active Problem List   Diagnosis Date Noted  . Diskitis 06/20/2019    Past Surgical History:  Procedure Laterality Date  . ANKLE FRACTURE SURGERY  2018  . BREAST BIOPSY  1995  . CATARACT EXTRACTION    . CESAREAN SECTION  1986  . KNEE ARTHROSCOPY W/ MENISCAL REPAIR    . TONSILLECTOMY  1974     OB History   No obstetric history on file.      Home Medications    Prior to Admission medications   Medication Sig Start Date End Date  Taking? Authorizing Provider  baclofen (LIORESAL) 20 MG tablet Take 20 mg by mouth 3 (three) times daily. 06/10/19  Yes [provider]  diclofenac (VOLTAREN) 75 MG EC tablet Take 75 mg by mouth daily.  11/02/16  Yes [provider]  felodipine (PLENDIL) 2.5 MG 24 hr tablet Take 2.5 mg by mouth daily.  11/02/16  Yes [provider]  fluvoxaMINE (LUVOX) 100 MG tablet Take 100 mg by mouth at bedtime.  10/05/16  Yes [provider]  FUROSEMIDE PO Take by mouth daily as needed.   Yes [provider]  Oxycodone HCl 10 MG TABS Take 10 mg by mouth every 4 (four) hours as needed.  10/12/16  Yes [provider]  PHENobarbital (LUMINAL) 64.8 MG tablet Take 64.8 mg by mouth daily as needed. 1.5 tabs at onset of migraine as needed   Yes [provider]  pramipexole (MIRAPEX) 0.25 MG tablet Take 0.25 mg by mouth daily.   Yes [provider]  aspirin EC 81 MG tablet Take 81 mg by mouth daily.    [provider]    Family History Family History  Problem Relation Age of Onset  . COPD Mother   . Heart disease Mother   . Lymphoma Mother   . Migraines Mother   . Pancreatic cancer Father   .  Neuropathy Neg Hx     Social History Social History   Tobacco Use  . Smoking status: Former Games developermoker  . Smokeless tobacco: Never Used  Substance Use Topics  . Alcohol use: Yes    Comment: 1-2 times per yr  . Drug use: No     Allergies   Latex   Review of Systems Review of Systems  Constitutional: Negative for fever.  HENT: Negative for ear pain and sore throat.   Eyes: Negative for visual disturbance.  Respiratory: Negative for cough and shortness of breath.   Cardiovascular: Negative for chest pain.  Gastrointestinal: Negative for abdominal pain, constipation, diarrhea, nausea and vomiting.  Genitourinary: Negative for dysuria and hematuria.  Musculoskeletal: Positive for back pain.  Skin: Negative for rash.  Neurological:  Positive for weakness and numbness. Negative for headaches.  All other systems reviewed and are negative.    Physical Exam Updated Vital Signs BP (!) 175/75   Pulse 68   Temp 99.5 F (37.5 C) (Oral)   Resp (!) 9   Ht 5\' 3"  (1.6 m)   Wt 88.5 kg   SpO2 95%   BMI 34.54 kg/m   Physical Exam Vitals signs and nursing note reviewed.  Constitutional:      General: She is not in acute distress.    Appearance: She is well-developed.  HENT:     Head: Normocephalic and atraumatic.  Eyes:     Conjunctiva/sclera: Conjunctivae normal.  Neck:     Musculoskeletal: Neck supple.  Cardiovascular:     Rate and Rhythm: Normal rate and regular rhythm.     Pulses: Normal pulses.     Heart sounds: Normal heart sounds. No murmur.  Pulmonary:     Effort: Pulmonary effort is normal. No respiratory distress.     Breath sounds: Normal breath sounds. No wheezing, rhonchi or rales.  Abdominal:     Palpations: Abdomen is soft.     Tenderness: There is no abdominal tenderness.  Musculoskeletal:     Comments: Mild lumbar TTP and left upper buttock TTP. ROM and strength are intact and symmetric to the BLE. Normal sensation to distal ble.   Skin:    General: Skin is warm and dry.  Neurological:     Mental Status: She is alert.      ED Treatments / Results  Labs (all labs ordered are listed, but only abnormal results are displayed) Labs Reviewed  COMPREHENSIVE METABOLIC PANEL - Abnormal; Notable for the following components:      Result Value   Glucose, Bld 109 (*)    All other components within normal limits  URINALYSIS, ROUTINE W REFLEX MICROSCOPIC - Abnormal; Notable for the following components:   Color, Urine STRAW (*)    All other components within normal limits  CULTURE, BLOOD (ROUTINE X 2)  CULTURE, BLOOD (ROUTINE X 2)  SARS CORONAVIRUS 2 (TAT 6-24 HRS)  LACTIC ACID, PLASMA  CBC WITH DIFFERENTIAL/PLATELET  SEDIMENTATION RATE  C-REACTIVE PROTEIN  HIV ANTIBODY (ROUTINE TESTING W  REFLEX)  BASIC METABOLIC PANEL  MAGNESIUM  CBC  PROTIME-INR    EKG EKG Interpretation  Date/Time:  Tuesday June 20 2019 15:33:06 EST Ventricular Rate:  68 PR Interval:    QRS Duration: 99 QT Interval:  408 QTC Calculation: 434 R Axis:   11 Text Interpretation: Sinus rhythm Baseline wander in lead(s) V2 No old tracing to compare Confirmed by Mancel BaleWentz, Elliott [phone]([id number]) on 06/20/2019 3:42:59 PM   Radiology Mr Lumbar Spine W Wo Contrast  Result  Date: 06/20/2019 CLINICAL DATA:  Chronic low back pain and left leg numbness. EXAM: MRI LUMBAR SPINE WITHOUT AND WITH CONTRAST TECHNIQUE: Multiplanar and multiecho pulse sequences of the lumbar spine were obtained without and with intravenous contrast. CONTRAST:  7 mL GADAVIST IV SOLN COMPARISON:  MRI lumbar spine 08/02/2018. FINDINGS: Segmentation:  Standard. Alignment: Convex right scoliosis is noted. There is trace retrolisthesis L3 on L4 and L4 on L5. Trace anterolisthesis L5 on S1 is noted. Vertebrae: Extensive endplate destructive change is present at the L2-3 level. There is fluid in the disc interspace and intense edema and enhancement throughout both vertebral bodies consistent with discitis and osteomyelitis. Paraspinous inflammatory change is seen but no rim enhancing fluid collection is identified. There is epidural phlegmon and disc at L2-3 causing moderate to moderately severe central canal stenosis and left worse than right subarticular recess narrowing. Severe bilateral foraminal narrowing is also present. Conus medullaris and cauda equina: Conus extends to the L2 level. Conus and cauda equina appear normal. Paraspinal and other soft tissues: As described above. Otherwise unremarkable. Disc levels: T10-11 is imaged in the sagittal plane only. There is loss of disc space height and a minimal bulge without stenosis. T11-12: Minimal disc bulge without stenosis. T12-L1: Loss of disc space height and a shallow bulge without stenosis. L1-2: Shallow  disc bulge and endplate spur without stenosis. L2-3: As described above. The appearance is worse than on the prior MRI. Central canal and foraminal stenosis is worse than on the prior MRI. L3-4: Mild disc bulge and mild-to-moderate facet arthropathy. Mild central canal and bilateral foraminal narrowing. No change. L4-5: Moderate to advanced bilateral facet degenerative disease and a shallow disc bulge. The central canal is open. Mild to moderate foraminal narrowing is worse on the right. No change. L5-S1: Advanced bilateral facet degenerative disease. The disc is uncovered with a shallow bulge. There is mild central canal and right foraminal narrowing. The left foramen is open. 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 Epidural phlegmon and disc cause moderate to moderately severe central canal stenosis and marked bilateral foraminal narrowing. The appearance is much worse than on the prior MRI. These results will be called to the ordering clinician or representative by the Radiologist Assistant, and communication documented in the PACS or zVision Dashboard. Electronically Signed   By: Drusilla Kanner M.D.   On: 06/20/2019 08:14    Procedures Procedures (including critical care time)  Medications Ordered in ED Medications  sodium chloride flush (NS) 0.9 % injection 3 mL (has no administration in time range)  acetaminophen (TYLENOL) tablet 650 mg (has no administration in time range)    Or  acetaminophen (TYLENOL) suppository 650 mg (has no administration in time range)     Initial Impression / Assessment and Plan / ED Course  I have reviewed the triage vital signs and the nursing notes.  Pertinent labs & imaging results that were available during my care of the patient were reviewed by me and considered in my medical decision making (see chart for details).   Final Clinical Impressions(s) / ED Diagnoses   Final diagnoses:  Discitis,  unspecified spinal region  Osteomyelitis, unspecified site, unspecified type [facility])   68 y/o female with h/o chronic back pain presenting for eval after she had MRI completed yesterday that showed discitis, osteomyelitis at L2-3  Reviewed records, MRI showed discitis and osteomyelitis at L2-3 with associated extensive endplate destructive change. Epidural phlegmon and disc cause moderate to moderately severe central  canal stenosis and marked bilateral foraminal narrowing. The appearance is much worse than on the prior MRI.   CBC w/o leukocytosis or anemia CMP WNL  Sed rate/crp pending on admssion Lactic acid negative, doubt sepsis.  Blood cultures obtained.  UA w/o evidence of UTI.   3:45 PM Discussed case with Versie Starks, PA-C from Dr. Towanda Malkin neurosurgery office. They advised pt to go to [facility] however, she felt that [facility] was closer to her and she wanted to come here. He does not have privileges at [facility] and therefore they recommended we call [facility] neurosurgery.   Discussed with patient that her current neurosurgeon, Dr. Patrice Paradise will not be able to participate in her care at [facility]. She would prefer to stay within the [facility] health system.   4:50 PM CONSULT With Sharon Seller, NP with [place] [facility] who states that neurosurgery will consult on the patient. She recommends admitting the patient to the hospitalist service and consulting infectious disease to determine the need for IR drainage.   5:05 PM CONSULT with Dr. Velia Meyer with hospitalist service who accepts patient for admission.   5:51 CONSULT with Dr. Baxter Flattery with infectious disease who recommended holding on antibiotic until aspiration can be completed by IR. Then likely start on vanc and cefepime once cultures are obtained.   6:16 PM CONSULT to Dr. Laurence Ferrari, with IR who will consult on the patient and complete aspiration. States this is nonemergent and pt can be transferred tonight or tomorrow AM for procedure.  Recommended no blood thinners tonight and pt will need to be NPO at midnight.   ED Discharge Orders    None       Rodney Booze, [place] 06/20/19 1821    Daleen Bo, MD 06/20/19 2332

## 2019-06-20 NOTE — Progress Notes (Signed)
Brief note regarding plan, with full H&P to follow:  Shawna Lewis is a 68 y.o. female with medical history significant for chronic low back pain, degenerative disc disease of the lumbar spine, hypertension, who is admitted to [facility] on 06/20/2019 with osteomyelitis/discitis of L2-L3 after presenting from home to [facility] emergency department at the direction of her neurosurgeon following result of abnormal outpatient MRI of the lumbar spine.   After discussing patient's case and imaging with neurosurgery, infectious disease, and interventional radiology patient to be admitted to [facility] for further evaluation and management of osteomyelitis/discitis of L2-L3.  Interventional radiology to perform lumbar aspiration with culture on the morning of 06/21/19.  N.p.o. after midnight in anticipation of this procedure.  Infectious disease recommends refraining from initiation of antibiotics until cultures are obtained via IR, as above.  No evidence of red flag symptoms associated with this presentation at the present time.    Babs Bertin, DO Hospitalist

## 2019-06-20 NOTE — ED Notes (Signed)
EKG given to Dr. Wentz 

## 2019-06-20 NOTE — ED Provider Notes (Signed)
  Face-to-face evaluation   History: She presents for evaluation of abnormal MRI.  This was discovered by her neurosurgeon, Dr. Patrice Paradise who ordered a lumbar MRI, because of chronic low back pain and left thigh pain.  The MRI has been done and shows discitis with osteomyelitis.  Physical exam: Obese, alert cooperative.  No dysarthria or aphasia.  No respiratory distress.  She is lucid.  Nontoxic appearance.  Medical screening examination/treatment/procedure(s) were conducted as a shared visit with non-physician practitioner(s) and myself.  I personally evaluated the patient during the encounter   Daleen Bo, MD 06/20/19 2332

## 2019-06-20 NOTE — ED Triage Notes (Signed)
Pt reports she fell approx 1 year ago and has had pain in lower back radiating down left leg.  Reports had an MRI yesterday and the doctor told her today that she has discitis and instructed her to come to the ED.  Pt says she took an oxycodone pta and rates pain 3 at this time.

## 2019-06-20 NOTE — ED Notes (Signed)
Lab in room drawing blood work  

## 2019-06-20 NOTE — H&P (Signed)
History and Physical    PLEASE NOTE THAT DRAGON DICTATION SOFTWARE WAS USED IN THE CONSTRUCTION OF THIS NOTE.   Shawna Lewis:[id number] DOB: 1951-03-08 DOA: 06/20/2019  PCP: Sinda Du, MD Patient coming from: home  I have personally briefly reviewed patient's old medical records in [place] [address]  Chief Complaint: abnormal MRI lumbar spine result  HPI: Shawna Lewis is a 68 y.o. female with medical history significant for chronic low back pain, degenerative disc disease of the lumbar spine, hypertension, who is admitted to [facility] on 06/20/2019 with osteomyelitis/discitis of L2-L3 after presenting from home to [facility] emergency department at the direction of her neurosurgeon following result of abnormal outpatient MRI of the lumbar spine.   The patient reports a history of chronic low back pain over the last 2 to 3 years in the setting of degenerative disc disease involving the lumbar spine as well as central canal stenosis.  In this context, the patient reports that she follows with Dr. Jaynee Eagles of neurology as well as Dr. Patrice Paradise, both of which are associated with [facility] medical.  In the setting of chronic, nonescalating, low back pain, the patient reports that she underwent routine follow-up MRI of the lumbar spine as an outpatient earlier today (06/20/19), as ordered by her outpatient neurosurgeon, Dr. Patrice Paradise.  This MRI, which was performed with and without contrast demonstrated osteomyelitis/discitis at L2-L3, epidural phlegmon, moderate central canal stenosis, and bilateral foraminal narrowing.  The patient reports that she was called with these results by Dr. Patrice Paradise, who recommended presentation to the emergency department for further evaluation.   The patient reports no worsening of the intensity of her low back pain over the course of the last year.  She reports chronic numbness over the dorsal and plantar aspects of the bilateral feet, which is unchanged  over the last 2 to 3 years.  Denies any associated saddle anesthesia.  She also denies any recent weakness involving the lower extremities.  Additionally, she denies any recent fecal incontinence or urinary retention.  Denies any trauma over the last 1 year.  She denies any recent subjective fever, chills, rigors, or generalized myalgias.  She also denies any headache, neck stiffness, sore throat, cough, nausea, vomiting, abdominal pain, diarrhea, or dysuria.  Denies any recent traveling or known positive COVID-19 exposures.  She denies any recent CNS procedures, and denies any history of intravenous drug use.  Additionally, the patient reports that she is on no blood thinning agents at home, including no aspirin, in spite of her home medication list including this anti-platelet med.    ED Course: Vital signs in the emergency department were notable for the following: Temperature max 99.5; heart rate 70-77; blood pressure 197/84; respiratory rate 15-18, and oxygen saturation 97 to 99% on room air.  Labs in the ED were notable for the following: CMP notable for bicarbonate 26, creatinine 0.49, glucose 109.  CBC notable for white blood cell count of 6300 with 58% neutrophils.  Lactic acid 1.0.  CRP 0.8.  ESR 22.  Urinalysis showed no white blood cells, and was leukocyte esterase/nitrite negative.  Nasopharyngeal COVID-19 swab was performed in the ED today, with result currently pending.  Blood cultures x2 are also collected in the ED today prior to the initiation of any antibiotics.   The patient's case and imaging were discussed with the APP for Dr. Kathyrn Sheriff of neurosurgery, who recommended admission to the hospital service with consultation of infectious disease as well  as interventional radiology, with plan for neurosurgery to formally consult on the patient in the morning.  The patient's case was also discussed with the on-call infectious disease physician, Dr. Baxter Flattery, who recommended lumbar aspiration  and cultures to be performed by interventional radiology. Dr. Baxter Flattery recommended that antibiotics be held until these cultures are obtained, following which he recommended the initiation of vancomycin as well as cefepime.  Subsequently, case was discussed with Dr. Laurence Ferrari of interventional radiology, who recommended IR-guided lumbar aspiration at [facility] on the morning of 06/21/2019. Dr. Laurence Ferrari asked that the patient be n.p.o. after midnight and and that patient no receive any blood thinning agents leading up to this procedure.   Subsequently, the patient was admitted to [facility] for further evaluation and management of presenting osteomyelitis/discitis at L2-L3, including formal neurosurgery consultation, formal infectious disease consultation, and plan for IR-guided lumbar aspiration with associated cultures.  While in [facility] ED, no IV fluids or medications were administered.   Review of Systems: As per HPI otherwise 10 point review of systems negative.   Past Medical History:  Diagnosis Date   Arthritis    DDD (degenerative disc disease), lumbar    Fibromyalgia    Hypertension    Migraine     Past Surgical History:  Procedure Laterality Date   ANKLE FRACTURE SURGERY  2018   BREAST BIOPSY  1995   CATARACT EXTRACTION     CESAREAN SECTION  1986   KNEE ARTHROSCOPY W/ MENISCAL REPAIR     TONSILLECTOMY  1974    Social History:  reports that she has quit smoking. She has never used smokeless tobacco. She reports current alcohol use. She reports that she does not use drugs.   Allergies  Allergen Reactions   Latex     Family History  Problem Relation Age of Onset   COPD Mother    Heart disease Mother    Lymphoma Mother    Migraines Mother    Pancreatic cancer Father    Neuropathy Neg Hx     Prior to Admission medications   Medication Sig Start Date End Date Taking? Authorizing Provider  baclofen (LIORESAL) 20 MG tablet Take 20 mg by  mouth 3 (three) times daily. 06/10/19  Yes [provider]  diclofenac (VOLTAREN) 75 MG EC tablet Take 75 mg by mouth daily.  11/02/16  Yes [provider]  felodipine (PLENDIL) 2.5 MG 24 hr tablet Take 2.5 mg by mouth daily.  11/02/16  Yes [provider]  fluvoxaMINE (LUVOX) 100 MG tablet Take 100 mg by mouth at bedtime.  10/05/16  Yes [provider]  FUROSEMIDE PO Take by mouth daily as needed.   Yes [provider]  Oxycodone HCl 10 MG TABS Take 10 mg by mouth every 4 (four) hours as needed.  10/12/16  Yes [provider]  PHENobarbital (LUMINAL) 64.8 MG tablet Take 64.8 mg by mouth daily as needed. 1.5 tabs at onset of migraine as needed   Yes [provider]  pramipexole (MIRAPEX) 0.25 MG tablet Take 0.25 mg by mouth daily.   Yes [provider]  aspirin EC 81 MG tablet Take 81 mg by mouth daily.    [provider]     Objective    Physical Exam: Vitals:   06/20/19 1346 06/20/19 1347 06/20/19 1533  BP: (!) 197/84  (!) 197/84  Pulse: 77  70  Resp: 18  15  Temp: 99.5 F (37.5 C)    TempSrc: Oral  SpO2: 97%  99%  Weight:  88.5 kg 88.5 kg  Height:  5' 3"  (1.6 m) 5' 3"  (1.6 m)    General: appears to be stated age; alert, oriented Skin: warm, dry;  Head:  AT/[place] Eyes:  PEARL b/l, EOMI Mouth:  Oral mucosa membranes appear moist, normal dentition Neck: supple; trachea midline Heart:  RRR; did not appreciate any M/R/G Lungs: CTAB, did not appreciate any wheezes, rales, or rhonchi Abdomen: + BS; soft, ND, NT Extremities: no peripheral edema, no muscle wasting Neuro: strength intact in upper and lower extremities b/l; diminished sensation to light touch over dorsal/plantar aspects of b/l feet. Otherwise, sensation intact in bilateral lower extremities.   Labs on Admission: I have personally reviewed following labs and imaging studies  CBC: Recent Labs  Lab 06/20/19 1551  WBC 6.3  NEUTROABS 3.6    HGB 12.1  HCT 39.7  MCV 91.1  PLT 546   Basic Metabolic Panel: Recent Labs  Lab 06/19/19 1627 06/20/19 1551  NA  --  140  K  --  4.2  CL  --  105  CO2  --  26  GLUCOSE  --  109*  BUN  --  16  CREATININE 0.70 0.49  CALCIUM  --  9.3   GFR: Estimated Creatinine Clearance: 72.9 mL/min (by C-G formula based on SCr of 0.49 mg/dL). Liver Function Tests: Recent Labs  Lab 06/20/19 1551  AST 29  ALT 27  ALKPHOS 71  BILITOT 0.3  PROT 8.0  ALBUMIN 4.5   No results for input(s): LIPASE, AMYLASE in the last 168 hours. No results for input(s): AMMONIA in the last 168 hours. Coagulation Profile: No results for input(s): INR, PROTIME in the last 168 hours. Cardiac Enzymes: No results for input(s): CKTOTAL, CKMB, CKMBINDEX, TROPONINI in the last 168 hours. BNP (last 3 results) No results for input(s): PROBNP in the last 8760 hours. HbA1C: No results for input(s): HGBA1C in the last 72 hours. CBG: No results for input(s): GLUCAP in the last 168 hours. Lipid Profile: No results for input(s): CHOL, HDL, LDLCALC, TRIG, CHOLHDL, LDLDIRECT in the last 72 hours. Thyroid Function Tests: No results for input(s): TSH, T4TOTAL, FREET4, T3FREE, THYROIDAB in the last 72 hours. Anemia Panel: No results for input(s): VITAMINB12, FOLATE, FERRITIN, TIBC, IRON, RETICCTPCT in the last 72 hours. Urine analysis:    Component Value Date/Time   COLORURINE STRAW (A) 06/20/2019 1511   APPEARANCEUR CLEAR 06/20/2019 1511   LABSPEC 1.010 06/20/2019 1511   PHURINE 6.0 06/20/2019 1511   GLUCOSEU NEGATIVE 06/20/2019 1511   HGBUR NEGATIVE 06/20/2019 1511   BILIRUBINUR NEGATIVE 06/20/2019 1511   KETONESUR NEGATIVE 06/20/2019 1511   PROTEINUR NEGATIVE 06/20/2019 1511   NITRITE NEGATIVE 06/20/2019 1511   LEUKOCYTESUR NEGATIVE 06/20/2019 1511    Radiological Exams on Admission: Mr Lumbar Spine W Wo Contrast  Result Date: 06/20/2019 CLINICAL DATA:  Chronic low back pain and left leg numbness. EXAM:  MRI LUMBAR SPINE WITHOUT AND WITH CONTRAST TECHNIQUE: Multiplanar and multiecho pulse sequences of the lumbar spine were obtained without and with intravenous contrast. CONTRAST:  7 mL GADAVIST IV SOLN COMPARISON:  MRI lumbar spine 08/02/2018. FINDINGS: Segmentation:  Standard. Alignment: Convex right scoliosis is noted. There is trace retrolisthesis L3 on L4 and L4 on L5. Trace anterolisthesis L5 on S1 is noted. Vertebrae: Extensive endplate destructive change is present at the L2-3 level. There is fluid in the disc interspace and intense edema and enhancement throughout both vertebral bodies consistent with discitis and  osteomyelitis. Paraspinous inflammatory change is seen but no rim enhancing fluid collection is identified. There is epidural phlegmon and disc at L2-3 causing moderate to moderately severe central canal stenosis and left worse than right subarticular recess narrowing. Severe bilateral foraminal narrowing is also present. Conus medullaris and cauda equina: Conus extends to the L2 level. Conus and cauda equina appear normal. Paraspinal and other soft tissues: As described above. Otherwise unremarkable. Disc levels: T10-11 is imaged in the sagittal plane only. There is loss of disc space height and a minimal bulge without stenosis. T11-12: Minimal disc bulge without stenosis. T12-L1: Loss of disc space height and a shallow bulge without stenosis. L1-2: Shallow disc bulge and endplate spur without stenosis. L2-3: As described above. The appearance is worse than on the prior MRI. Central canal and foraminal stenosis is worse than on the prior MRI. L3-4: Mild disc bulge and mild-to-moderate facet arthropathy. Mild central canal and bilateral foraminal narrowing. No change. L4-5: Moderate to advanced bilateral facet degenerative disease and a shallow disc bulge. The central canal is open. Mild to moderate foraminal narrowing is worse on the right. No change. L5-S1: Advanced bilateral facet degenerative  disease. The disc is uncovered with a shallow bulge. There is mild central canal and right foraminal narrowing. The left foramen is open. 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 Epidural phlegmon and disc cause moderate to moderately severe central canal stenosis and marked bilateral foraminal narrowing. The appearance is much worse than on the prior MRI. These results will be called to the ordering clinician or representative by the Radiologist Assistant, and communication documented in the PACS or zVision Dashboard. Electronically Signed   By: Inge Rise M.D.   On: 06/20/2019 08:14    Assessment/Plan   Shawna Lewis is a 68 y.o. female with medical history significant for chronic low back pain, degenerative disc disease of the lumbar spine, hypertension, who is admitted to [facility] on 06/20/2019 with osteomyelitis/discitis of L2-L3 after presenting from home to [facility] emergency department at the direction of her neurosurgeon following result of abnormal outpatient MRI of the lumbar spine.    Principal Problem:   Diskitis Active Problems:   Osteomyelitis (HCC)   HTN (hypertension)   DDD (degenerative disc disease), lumbar   Migraine   Chronic low back pain   Central stenosis of spinal canal   #) Osteomyelitis/discitis at L2/L3: In the setting of a history of chronic low back pain associated with degenerative disc disease of the lumbar spine, associated central canal stenosis and bilateral foraminal narrowing, outpatient MRI of the lumbar spine performed earlier today incidentally noted osteomyelitis/discitis at L2-L3.  This is in the absence of any associated red flag symptoms, including no evidence of saddle anesthesia or change in bowel/bladder function.  The patient reports chronic, unchanged numbness associated with the bilateral feet, and denies any recent weakness involving the lower extremities.  No  evidence of systemic infection at this time, and no evidence of sepsis in the absence of any SIRS criteria. Of note, the patient denies any history of intravenous drug use.  Of note, CRP and ESR found to be nonelevated per labs performed this evening.  The patient's case and imaging were discussed with the APP for Dr. Kathyrn Sheriff of neurosurgery, who recommended admission to the hospital service with consultation of infectious disease as well as interventional radiology, with plan for neurosurgery to formally consult on the patient in the morning.  The patient's  case was also discussed with the on-call infectious disease physician, Dr. Baxter Flattery, who recommended lumbar aspiration and cultures to be performed by interventional radiology. Dr. Baxter Flattery recommended that antibiotics be held until these cultures are obtained, following which he recommended the initiation of vancomycin as well as cefepime.  Subsequently, case was discussed with Dr. Laurence Ferrari of interventional radiology, who recommended IR-guided lumbar aspiration at [facility] on the morning of 06/21/2019. Dr. Laurence Ferrari asked that the patient be n.p.o. after midnight and and that patient no receive any blood thinning agents leading up to this procedure.    Plan: Admission to [facility] with plan for IR-guided lumbar aspiration with cultures to occur on the morning of 06/21/2019 per discussions with on-call interventional radiologist, Dr. Laurence Ferrari, as above.  In preparation for this, the patient will be n.p.o. after midnight.  We will also refrain from blood thinning agents, including pharmacologic DVT prophylaxis leading up to this procedure.  Per discussions with on-call infectious disease physician, will refrain from initiation of antibiotics until cultures obtained via interventional radiology in the morning, as above.  Will follow for results blood cultures x2 collected in the ED this evening.  Neurosurgery consultation in the morning, as  above.  As needed IV fentanyl.  Check INR.  Check urinary drug screen.  Repeat CBC with differential in the morning.    #) History of essential hypertension: On felodipine as her sole outpatient antihypertensive agent.  Presenting blood pressure slightly elevated, although a portion of this is suspected to be on the basis of low back discomfort.   Plan: We will plan to resume outpatient calcium channel blocker after IR guided procedure, as above.  Close monitoring of ensuing blood pressure via routine vital signs.     #) Chronic low back pain: In the setting of a history of degenerative disc disease of the lumbar spine associated with central canal stenosis and bilateral foraminal narrowing, the patient reports chronic low back pain over the last 2 to 3 years, without any recent significant worsening thereof.  In addition it is not associated with any acute focal neurologic deficits as red flag symptoms, as described above.  Plan: In the setting of n.p.o. after midnight, as needed IV fentanyl has been ordered.     #) History of migraines: The patient reports no recent migraines, and reports that she is prescribed prn phenobarbital for migraine abortion.   Plan: will hold prn phenobarbital during this hospitalization.     DVT prophylaxis: scd's  Code Status: Full code Family Communication: The patient's case was discussed with her husband, who was present at bedside. Disposition Plan:  Per Rounding Team Consults called:  the APP for Dr. Kathyrn Sheriff of neurosurgery, Dr. Laurence Ferrari of interventional radiology, and Dr. Baxter Flattery of infectious disease. Admission status: Inpatient admission to [facility]; med-tele.    PLEASE NOTE THAT DRAGON DICTATION SOFTWARE WAS USED IN THE CONSTRUCTION OF THIS NOTE.   [facility] Pager [phone] From 3PM- 11PM.   Otherwise, please contact night-coverage  [email or web address] Password [email or web address]  06/20/2019, 5:09 PM

## 2019-06-21 ENCOUNTER — Other Ambulatory Visit: Payer: Self-pay | Admitting: Neurosurgery

## 2019-06-21 ENCOUNTER — Encounter (HOSPITAL_COMMUNITY): Payer: Self-pay | Admitting: *Deleted

## 2019-06-21 DIAGNOSIS — G061 Intraspinal abscess and granuloma: Secondary | ICD-10-CM

## 2019-06-21 DIAGNOSIS — G8921 Chronic pain due to trauma: Secondary | ICD-10-CM

## 2019-06-21 DIAGNOSIS — Z87891 Personal history of nicotine dependence: Secondary | ICD-10-CM

## 2019-06-21 DIAGNOSIS — G43909 Migraine, unspecified, not intractable, without status migrainosus: Secondary | ICD-10-CM

## 2019-06-21 DIAGNOSIS — M4626 Osteomyelitis of vertebra, lumbar region: Secondary | ICD-10-CM

## 2019-06-21 DIAGNOSIS — M5442 Lumbago with sciatica, left side: Secondary | ICD-10-CM

## 2019-06-21 DIAGNOSIS — M1990 Unspecified osteoarthritis, unspecified site: Secondary | ICD-10-CM

## 2019-06-21 DIAGNOSIS — Z91040 Latex allergy status: Secondary | ICD-10-CM

## 2019-06-21 DIAGNOSIS — R011 Cardiac murmur, unspecified: Secondary | ICD-10-CM

## 2019-06-21 DIAGNOSIS — M5116 Intervertebral disc disorders with radiculopathy, lumbar region: Secondary | ICD-10-CM

## 2019-06-21 DIAGNOSIS — M797 Fibromyalgia: Secondary | ICD-10-CM

## 2019-06-21 DIAGNOSIS — M4800 Spinal stenosis, site unspecified: Secondary | ICD-10-CM

## 2019-06-21 DIAGNOSIS — M5416 Radiculopathy, lumbar region: Secondary | ICD-10-CM | POA: Diagnosis present

## 2019-06-21 DIAGNOSIS — I10 Essential (primary) hypertension: Secondary | ICD-10-CM

## 2019-06-21 DIAGNOSIS — M4646 Discitis, unspecified, lumbar region: Principal | ICD-10-CM

## 2019-06-21 LAB — RAPID URINE DRUG SCREEN, HOSP PERFORMED
Amphetamines: NOT DETECTED
Barbiturates: NOT DETECTED
Benzodiazepines: NOT DETECTED
Cocaine: NOT DETECTED
Opiates: POSITIVE — AB
Tetrahydrocannabinol: NOT DETECTED

## 2019-06-21 LAB — BASIC METABOLIC PANEL
Anion gap: 10 (ref 5–15)
BUN: 13 mg/dL (ref 8–23)
CO2: 27 mmol/L (ref 22–32)
Calcium: 9 mg/dL (ref 8.9–10.3)
Chloride: 103 mmol/L (ref 98–111)
Creatinine, Ser: 0.47 mg/dL (ref 0.44–1.00)
GFR calc Af Amer: >60 mL/min (ref >60)
GFR calc non Af Amer: >60 mL/min (ref >60)
Glucose, Bld: 109 mg/dL — ABNORMAL HIGH (ref 70–99)
Potassium: 3.9 mmol/L (ref 3.5–5.1)
Sodium: 140 mmol/L (ref 135–145)

## 2019-06-21 LAB — CBC
HCT: 37.6 % (ref 36.0–46.0)
Hemoglobin: 11.5 g/dL — ABNORMAL LOW (ref 12.0–15.0)
MCH: 27.8 pg (ref 26.0–34.0)
MCHC: 30.6 g/dL (ref 30.0–36.0)
MCV: 91 fL (ref 80.0–100.0)
Platelets: 210 10*3/uL (ref 150–400)
RBC: 4.13 MIL/uL (ref 3.87–5.11)
RDW: 14.4 % (ref 11.5–15.5)
WBC: 6.1 10*3/uL (ref 4.0–10.5)
nRBC: 0 % (ref 0.0–0.2)

## 2019-06-21 LAB — MAGNESIUM: Magnesium: 2.1 mg/dL (ref 1.7–2.4)

## 2019-06-21 LAB — HIV ANTIBODY (ROUTINE TESTING W REFLEX): HIV Screen 4th Generation wRfx: NONREACTIVE

## 2019-06-21 LAB — SARS CORONAVIRUS 2 (TAT 6-24 HRS): SARS Coronavirus 2: NEGATIVE

## 2019-06-21 MED ORDER — OXYMETAZOLINE HCL 0.05 % NA SOLN
1.0000 | Freq: Two times a day (BID) | NASAL | Status: DC
  Administered 2019-06-21 – 2019-06-25 (×8): 1 via NASAL
  Filled 2019-06-21: qty 30

## 2019-06-21 MED ORDER — OXYCODONE HCL 5 MG PO TABS
5.0000 mg | ORAL_TABLET | ORAL | Status: DC | PRN
  Administered 2019-06-21 – 2019-06-22 (×3): 5 mg via ORAL
  Filled 2019-06-21 (×3): qty 1

## 2019-06-21 MED ORDER — LABETALOL HCL 5 MG/ML IV SOLN
10.0000 mg | INTRAVENOUS | Status: DC | PRN
  Administered 2019-06-21 – 2019-06-24 (×2): 10 mg via INTRAVENOUS
  Filled 2019-06-21 (×2): qty 4

## 2019-06-21 NOTE — Consult Note (Signed)
[facility]    Date of Admission:  06/20/2019     Total days of antibiotics                Reason for Consult: Vertebral discitis / osteomyelitis     Referring Provider: Laural BenesJohnson Primary Care Provider: Kari BaarsHawkins, Edward, MD    Assessment: Ulyses SouthwardJoyce W Creedon is a 68 y.o. female with chronic back pain overlying lumbar spine with neuropathy of both feet with incidental findings of L2-3 discitis/osteomyelitis with epidural phlegmon that has been what sounds like a subacute process following blunt trauma 11 months ago. Will follow micro data following disc aspiration and arrange for PICC to be placed if initial blood cultures are without growth at 48 hours in attempt to plan for discharge back home as she is hopeful this can be as short a stay as possible. Following her aspiration once that is done will plan to start IV Vancomycin + Ceftriaxone empirically while we follow cultures.   She is on chronic PO pain regimen and requesting to have this continue vs using short acting IV medications - will defer to admitting team.     Plan: 1. Hold antibiotics until disc aspiration completed 2. Then start Vancomycin per pharmacy + Ceftriaxone 2 gm IV Q24  3. Place PICC once initial set of blood cultures are no growth x 48 hours 4. Will need home health arranged with CM team 5. Will place order for her nasal spray to continue use per her request.    Principal Problem:   Diskitis Active Problems:   Osteomyelitis (HCC)   HTN (hypertension)   DDD (degenerative disc disease), lumbar   Migraine   Chronic low back pain   Central stenosis of spinal canal   Chronic left lumbar radiculopathy     fluvoxaMINE  100 mg Oral QHS   sodium chloride flush  3 mL Intravenous Q12H    HPI: Ulyses SouthwardJoyce W Pha is a 68 y.o. female with pmhx including arthritis, fibromyalgia, hypertension, migraine, chronic lower back pain r/t DDD of L-spine and heart murmur  She was transferred  to [facility] for further management of vertebral infection after presenting from home after MRI ordered by her neurosurgeon was found to be abnormal. She has had degenerative spine and central canal stenosis problems that have contributed to some degree of back pain for approximately 15 years now. She was folloing with Dr. Noel Geroldohen with neurology and underwent an MRI of the L-spine earlier yesterday 12/01 which demonstrated epidural phelgmon and osteomyelitis and  discitis of L2-L3.   She describes that she fell in the bathroom hard onto her buttocks/lower back 11 months ago that immediately caused severe pain that confided her to a bed essentially x 3 months. She notes that she developed a "pressure sore" overlying her sacrum due to repositioning in bed and friction in bed. During this time she was treated with Prednisone dose packs (4 rounds) with improvement in pain following each of them. Slowly since her fall she has found herself improving back to her baseline level of discomfort. She does experience numbness to the feet bilaterally that has been unchanged and stable from 3 years ago. She has been walking at home without any reported weakness and no acute change in strength/motor ability. Denies any urinary or bowel incontinence. Denies any fevers/chills or night sweats.   Her case was d/w [facility]GY at [facility] and Dr. Drue SecondSnider overnight with plan  to transfer here for L-spine aspiration today. She has had blood cultures drawn and so far no early growth. She is to be seen formally by nsgy team. She has not had any antibiotics for any other reasons over the last 3 months.    Review of Systems: Review of Systems  Constitutional: Positive for unexpected weight change. Negative for activity change, chills, fatigue and fever.  HENT: Positive for congestion. Negative for dental problem and sore throat.   Respiratory: Negative for chest tightness and shortness of breath.   Cardiovascular: Negative  for chest pain, palpitations and leg swelling.  Gastrointestinal: Negative for abdominal pain, diarrhea and nausea.  Genitourinary: Negative for difficulty urinating and flank pain.  Musculoskeletal: Positive for back pain. Negative for gait problem, myalgias and neck pain.  Skin: Negative for color change and rash.  Neurological: Positive for numbness. Negative for dizziness, weakness and light-headedness.  Hematological: Negative for adenopathy.  Psychiatric/Behavioral: The patient is not nervous/anxious.     Past Medical History:  Diagnosis Date   Arthritis    DDD (degenerative disc disease), lumbar    Fibromyalgia    Heart murmur    Hypertension    Migraine     Social History   Tobacco Use   Smoking status: Former Smoker   Smokeless tobacco: Never Used  Substance Use Topics   Alcohol use: Yes    Comment: 1-2 times per yr   Drug use: No    Family History  Problem Relation Age of Onset   COPD Mother    Heart disease Mother    Lymphoma Mother    Migraines Mother    Pancreatic cancer Father    Neuropathy Neg Hx    Allergies  Allergen Reactions   Latex     OBJECTIVE: Blood pressure (!) 151/64, pulse 66, temperature 98.9 F (37.2 C), temperature source Oral, resp. rate 16, height 5\' 3"  (1.6 m), weight 88.5 kg, SpO2 98 %.  Physical Exam Constitutional:      General: She is not in acute distress.    Appearance: Normal appearance. She is not ill-appearing.     Comments: Seated comfortably in bed.   HENT:     Mouth/Throat:     Mouth: Mucous membranes are moist.     Pharynx: No oropharyngeal exudate.  Eyes:     General: No scleral icterus. Neck:     Musculoskeletal: Normal range of motion. No muscular tenderness.  Cardiovascular:     Rate and Rhythm: Normal rate and regular rhythm.     Heart sounds: No murmur.  Pulmonary:     Effort: Pulmonary effort is normal. No respiratory distress.     Breath sounds: Normal breath sounds.  Chest:      Chest wall: No tenderness.  Abdominal:     General: Bowel sounds are normal. There is no distension.     Tenderness: There is no abdominal tenderness.  Musculoskeletal:     Right lower leg: No edema.     Left lower leg: No edema.     Comments: Bilateral stable numbness to feet below ankles. Otherwise sensation equal bilaterally shins to hips.   Skin:    General: Skin is warm and dry.     Capillary Refill: Capillary refill takes less than 2 seconds.     Findings: No rash.  Neurological:     Mental Status: She is alert and oriented to person, place, and time.  Psychiatric:        Mood and Affect: Mood normal.  Behavior: Behavior normal.        Judgment: Judgment normal.     Lab Results Lab Results  Component Value Date   WBC 6.1 06/21/2019   HGB 11.5 (L) 06/21/2019   HCT 37.6 06/21/2019   MCV 91.0 06/21/2019   PLT 210 06/21/2019    Lab Results  Component Value Date   CREATININE 0.47 06/21/2019   BUN 13 06/21/2019   NA 140 06/21/2019   K 3.9 06/21/2019   CL 103 06/21/2019   CO2 27 06/21/2019    Lab Results  Component Value Date   ALT 27 06/20/2019   AST 29 06/20/2019   ALKPHOS 71 06/20/2019   BILITOT 0.3 06/20/2019     Microbiology: Recent Results (from the past 240 hour(s))  Blood Culture (routine x 2)     Status: None (Preliminary result)   Collection Time: 06/20/19  3:40 PM   Specimen: BLOOD LEFT FOREARM  Result Value Ref Range Status   Specimen Description BLOOD LEFT FOREARM  Final   Special Requests   Final    BOTTLES DRAWN AEROBIC AND ANAEROBIC Blood Culture adequate volume   Culture   Final    NO GROWTH < 24 HOURS Performed at [facility], [address] [place], [place] [address]    Report Status PENDING  Incomplete  Blood Culture (routine x 2)     Status: None (Preliminary result)   Collection Time: 06/20/19  3:51 PM   Specimen: BLOOD RIGHT ARM  Result Value Ref Range Status   Specimen Description BLOOD RIGHT ARM  Final   Special Requests    Final    BOTTLES DRAWN AEROBIC AND ANAEROBIC Blood Culture adequate volume   Culture   Final    NO GROWTH < 24 HOURS Performed at [facility], [address], [place], [place] [address]    Report Status PENDING  Incomplete  SARS CORONAVIRUS 2 (TAT 6-24 HRS) Nasopharyngeal Nasopharyngeal Swab     Status: None   Collection Time: 06/20/19  5:40 PM   Specimen: Nasopharyngeal Swab  Result Value Ref Range Status   SARS Coronavirus 2 NEGATIVE NEGATIVE Final    Comment: (NOTE) SARS-CoV-2 target nucleic acids are NOT DETECTED. The SARS-CoV-2 RNA is generally detectable in upper and lower respiratory specimens during the acute phase of infection. Negative results do not preclude SARS-CoV-2 infection, do not rule out co-infections with other pathogens, and should not be used as the sole basis for treatment or other patient management decisions. Negative results must be combined with clinical observations, patient history, and epidemiological information. The expected result is Negative. Fact Sheet for Patients: HairSlick.no Fact Sheet for Healthcare Providers: quierodirigir.com This test is not yet approved or cleared by the [place] FDA and  has been authorized for detection and/or diagnosis of SARS-CoV-2 by FDA under an Emergency Use Authorization (EUA). This EUA will remain  in effect (meaning this test can be used) for the duration of the COVID-19 declaration under Section 56 4(b)(1) of the Act, 21 U.S.C. section 360bbb-3(b)(1), unless the authorization is terminated or revoked sooner. Performed at [facility], [address] [place], [place] [address]      Rexene Alberts, MSN, NP-C [facility]  [email or web address] Pager: [phone] Office: [phone] [facility] Main Line: [phone]

## 2019-06-21 NOTE — Consult Note (Signed)
Chief Complaint   Chief Complaint  Patient presents with  . Back Pain    HPI   Consult requested by: Dr Arlean Hopping, [facility] Reason for consult: Lumbar discitis osteomyelitis  HPI: Shawna Lewis is a 68 y.o. female With longstanding history of chronic lower back pain, currently being managed by Dr. Riley Nearing, who underwent an MRI of the lumbar spine on 06/19/2019 for further workup of said chronic back pain.  MRI revealed discitis and osteomyelitis at L2-3.  Dr. Noel Gerold reached out to the patient regarding the findings and advised her to go to the emergency room.  Patient elected to go to a [facility] emergency room instead of [facility] where Dr. Noel Gerold rec, since it was closer to home. Dr. Noel Gerold was notified of patient's arrival to [facility] ED, however because he does not have privileges here with [facility], he recommended our neurosurgical office be called.   Patient was transferred to [facility] this morning.  I came by to evaluate her.  She complains of fairly mild chronic lower back pain. No worsening. She has chronic bilateral leg numbness/tingling x several years unchanged. No radiating pains. No bowel/bladder dysfunction.Takes oxy 5mg  q 3 hours for chronic back pain.  Patient Active Problem List   Diagnosis Date Noted  . Chronic left lumbar radiculopathy 06/21/2019  . Diskitis 06/20/2019  . Osteomyelitis (HCC) 06/20/2019  . HTN (hypertension) 06/20/2019  . DDD (degenerative disc disease), lumbar   . Migraine   . Chronic low back pain   . Central stenosis of spinal canal     PMH: Past Medical History:  Diagnosis Date  . Arthritis   . DDD (degenerative disc disease), lumbar   . Fibromyalgia   . Heart murmur   . Hypertension   . Migraine     PSH: Past Surgical History:  Procedure Laterality Date  . ANKLE FRACTURE SURGERY  2018  . BREAST BIOPSY  1995  . CATARACT EXTRACTION    . CESAREAN SECTION  1986  . KNEE ARTHROSCOPY W/ MENISCAL REPAIR    . TONSILLECTOMY  1974     Medications Prior to Admission  Medication Sig Dispense Refill Last Dose  . baclofen (LIORESAL) 20 MG tablet Take 20 mg by mouth 3 (three) times daily.   06/20/2019 at Unknown time  . diclofenac (VOLTAREN) 75 MG EC tablet Take 75 mg by mouth daily.    06/20/2019 at Unknown time  . felodipine (PLENDIL) 2.5 MG 24 hr tablet Take 2.5 mg by mouth daily.    06/19/2019 at Unknown time  . fluvoxaMINE (LUVOX) 100 MG tablet Take 100 mg by mouth at bedtime.    06/20/2019 at Unknown time  . FUROSEMIDE PO Take by mouth daily as needed.     . Oxycodone HCl 10 MG TABS Take 10 mg by mouth every 4 (four) hours as needed.    06/20/2019 at Unknown time  . PHENobarbital (LUMINAL) 64.8 MG tablet Take 64.8 mg by mouth daily as needed. 1.5 tabs at onset of migraine as needed   Past Month at Unknown time  . pramipexole (MIRAPEX) 0.25 MG tablet Take 0.25 mg by mouth daily.     14/07/2018 aspirin EC 81 MG tablet Take 81 mg by mouth daily.   Not Taking at Unknown time    SH: Social History   Tobacco Use  . Smoking status: Former [employer]  . Smokeless tobacco: Never Used  Substance Use Topics  . Alcohol use: Yes    Comment: 1-2 times  per yr  . Drug use: No    MEDS: Prior to Admission medications   Medication Sig Start Date End Date Taking? Authorizing Provider  baclofen (LIORESAL) 20 MG tablet Take 20 mg by mouth 3 (three) times daily. 06/10/19  Yes [provider]  diclofenac (VOLTAREN) 75 MG EC tablet Take 75 mg by mouth daily.  11/02/16  Yes [provider]  felodipine (PLENDIL) 2.5 MG 24 hr tablet Take 2.5 mg by mouth daily.  11/02/16  Yes [provider]  fluvoxaMINE (LUVOX) 100 MG tablet Take 100 mg by mouth at bedtime.  10/05/16  Yes [provider]  FUROSEMIDE PO Take by mouth daily as needed.   Yes [provider]  Oxycodone HCl 10 MG TABS Take 10 mg by mouth every 4 (four) hours as needed.  10/12/16  Yes [provider]  PHENobarbital (LUMINAL) 64.8 MG tablet Take  64.8 mg by mouth daily as needed. 1.5 tabs at onset of migraine as needed   Yes [provider]  pramipexole (MIRAPEX) 0.25 MG tablet Take 0.25 mg by mouth daily.   Yes [provider]  aspirin EC 81 MG tablet Take 81 mg by mouth daily.    [provider]    ALLERGY: Allergies  Allergen Reactions  . Latex     Social History   Tobacco Use  . Smoking status: Former Games developer  . Smokeless tobacco: Never Used  Substance Use Topics  . Alcohol use: Yes    Comment: 1-2 times per yr     Family History  Problem Relation Age of Onset  . COPD Mother   . Heart disease Mother   . Lymphoma Mother   . Migraines Mother   . Pancreatic cancer Father   . Neuropathy Neg Hx      ROS   Review of Systems  Constitutional: Negative.   HENT: Negative.   Eyes: Negative.   Respiratory: Negative.   Cardiovascular: Negative.   Gastrointestinal: Negative for nausea and vomiting.  Musculoskeletal: Positive for back pain and myalgias.  Skin: Negative.   Neurological: Positive for tingling. Negative for dizziness, tremors, sensory change, speech change, focal weakness, seizures, loss of consciousness, weakness and headaches.    Exam   Vitals:   06/21/19 0915 06/21/19 1038  BP: (!) 142/64 (!) 151/64  Pulse:  66  Resp:  16  Temp:  98.9 F (37.2 C)  SpO2:  98%   General appearance: WDWN, NAD Eyes: No scleral injection Cardiovascular: Regular rate and rhythm without murmurs, rubs, gallops. No edema or variciosities. Distal pulses normal. Pulmonary: Effort normal, non-labored breathing Musculoskeletal:     Muscle tone upper extremities: Normal    Muscle tone lower extremities: Normal    Motor exam: Upper Extremities Deltoid Bicep Tricep Grip  Right 5/5 5/5 5/5 5/5  Left 5/5 5/5 5/5 5/5   Lower Extremity IP Quad PF DF EHL  Right 5/5 5/5 5/5 5/5 5/5  Left 5/5 5/5 5/5 5/5 5/5   Neurological Mental Status:    - Patient is awake, alert, oriented to person, place,  month, year, and situation    - Patient is able to give a clear and coherent history.    - No signs of aphasia or neglect Cranial Nerves    - II: Visual Fields are full. PERRL    - III/IV/VI: EOMI without ptosis or diploplia.     - V: Facial sensation is grossly normal    - VII: Facial movement is symmetric.     -  VIII: hearing is intact to voice    - X: Uvula elevates symmetrically    - XI: Shoulder shrug is symmetric.    - XII: tongue is midline without atrophy or fasciculations.  Sensory: Sensation grossly intact to LT Deep Tendon Reflexes    - 2+ and symmetric in the biceps and patellae. Plantars   - Toes are downgoing bilaterally.   Results - Imaging/Labs   Results for orders placed or performed during the hospital encounter of 06/20/19 (from the past 48 hour(s))  Urinalysis, Routine w reflex microscopic     Status: Abnormal   Collection Time: 06/20/19  3:11 PM  Result Value Ref Range   Color, Urine STRAW (A) YELLOW   APPearance CLEAR CLEAR   Specific Gravity, Urine 1.010 1.005 - 1.030   pH 6.0 5.0 - 8.0   Glucose, UA NEGATIVE NEGATIVE mg/dL   Hgb urine dipstick NEGATIVE NEGATIVE   Bilirubin Urine NEGATIVE NEGATIVE   Ketones, ur NEGATIVE NEGATIVE mg/dL   Protein, ur NEGATIVE NEGATIVE mg/dL   Nitrite NEGATIVE NEGATIVE   Leukocytes,Ua NEGATIVE NEGATIVE    Comment: Performed at [facility], [address], [place], [place] [address]  Blood Culture (routine x 2)     Status: None (Preliminary result)   Collection Time: 06/20/19  3:40 PM   Specimen: BLOOD LEFT FOREARM  Result Value Ref Range   Specimen Description BLOOD LEFT FOREARM    Special Requests      BOTTLES DRAWN AEROBIC AND ANAEROBIC Blood Culture adequate volume   Culture      NO GROWTH < 24 HOURS Performed at [facility], [address], [place], [place] [address]    Report Status PENDING   Lactic acid, plasma     Status: None   Collection Time: 06/20/19  3:50 PM  Result Value Ref Range   Lactic Acid,  Venous 1.0 0.5 - 1.9 mmol/L    Comment: Performed at [facility], [address], [place], [place] [address]  Comprehensive metabolic panel     Status: Abnormal   Collection Time: 06/20/19  3:51 PM  Result Value Ref Range   Sodium 140 135 - 145 mmol/L   Potassium 4.2 3.5 - 5.1 mmol/L   Chloride 105 98 - 111 mmol/L   CO2 26 22 - 32 mmol/L   Glucose, Bld 109 (H) 70 - 99 mg/dL   BUN 16 8 - 23 mg/dL   Creatinine, Ser 1.30 0.44 - 1.00 mg/dL   Calcium 9.3 8.9 - 86.5 mg/dL   Total Protein 8.0 6.5 - 8.1 g/dL   Albumin 4.5 3.5 - 5.0 g/dL   AST 29 15 - 41 U/L   ALT 27 0 - 44 U/L   Alkaline Phosphatase 71 38 - 126 U/L   Total Bilirubin 0.3 0.3 - 1.2 mg/dL   GFR calc non Af Amer >60 >60 mL/min   GFR calc Af Amer >60 >60 mL/min   Anion gap 9 5 - 15    Comment: Performed at [facility], [address], [place], [place] [address]  CBC WITH DIFFERENTIAL     Status: None   Collection Time: 06/20/19  3:51 PM  Result Value Ref Range   WBC 6.3 4.0 - 10.5 K/uL   RBC 4.36 3.87 - 5.11 MIL/uL   Hemoglobin 12.1 12.0 - 15.0 g/dL   HCT 62.9 52.8 - 41.3 %   MCV 91.1 80.0 - 100.0 fL   MCH 27.8 26.0 - 34.0 pg   MCHC 30.5 30.0 -  36.0 g/dL   RDW 14.5 11.5 - 15.5 %   Platelets 209 150 - 400 K/uL   nRBC 0.0 0.0 - 0.2 %   Neutrophils Relative % 58 %   Neutro Abs 3.6 1.7 - 7.7 K/uL   Lymphocytes Relative 30 %   Lymphs Abs 1.9 0.7 - 4.0 K/uL   Monocytes Relative 6 %   Monocytes Absolute 0.4 0.1 - 1.0 K/uL   Eosinophils Relative 5 %   Eosinophils Absolute 0.3 0.0 - 0.5 K/uL   Basophils Relative 1 %   Basophils Absolute 0.1 0.0 - 0.1 K/uL   Immature Granulocytes 0 %   Abs Immature Granulocytes 0.01 0.00 - 0.07 K/uL    Comment: Performed at [facility], [address], [place], [place] [address]  Blood Culture (routine x 2)     Status: None (Preliminary result)   Collection Time: 06/20/19  3:51 PM   Specimen: BLOOD RIGHT ARM  Result Value Ref Range   Specimen Description BLOOD RIGHT ARM    Special  Requests      BOTTLES DRAWN AEROBIC AND ANAEROBIC Blood Culture adequate volume   Culture      NO GROWTH < 24 HOURS Performed at [facility], [address] [place], [place] [address]    Report Status PENDING   Sedimentation rate     Status: None   Collection Time: 06/20/19  3:51 PM  Result Value Ref Range   Sed Rate 22 0 - 22 mm/hr    Comment: Performed at [facility], [address], [place], [place] [address]  C-reactive protein     Status: None   Collection Time: 06/20/19  3:51 PM  Result Value Ref Range   CRP <0.8 <1.0 mg/dL    Comment: Performed at [facility], [address], [place], [place] [address]  Protime-INR     Status: None   Collection Time: 06/20/19  3:51 PM  Result Value Ref Range   Prothrombin Time 13.2 11.4 - 15.2 seconds   INR 1.0 0.8 - 1.2    Comment: (NOTE) INR goal varies based on device and disease states. Performed at [facility], [address] [place], [place] [address]   SARS CORONAVIRUS 2 (TAT 6-24 HRS) Nasopharyngeal Nasopharyngeal Swab     Status: None   Collection Time: 06/20/19  5:40 PM   Specimen: Nasopharyngeal Swab  Result Value Ref Range   SARS Coronavirus 2 NEGATIVE NEGATIVE    Comment: (NOTE) SARS-CoV-2 target nucleic acids are NOT DETECTED. The SARS-CoV-2 RNA is generally detectable in upper and lower respiratory specimens during the acute phase of infection. Negative results do not preclude SARS-CoV-2 infection, do not rule out co-infections with other pathogens, and should not be used as the sole basis for treatment or other patient management decisions. Negative results must be combined with clinical observations, patient history, and epidemiological information. The expected result is Negative. Fact Sheet for Patients: SugarRoll.be Fact Sheet for Healthcare Providers: https://www.woods-mathews.com/ This test is not yet approved or cleared by the [place] FDA and  has been  authorized for detection and/or diagnosis of SARS-CoV-2 by FDA under an Emergency Use Authorization (EUA). This EUA will remain  in effect (meaning this test can be used) for the duration of the COVID-19 declaration under Section 56 4(b)(1) of the Act, 21 U.S.C. section 360bbb-3(b)(1), unless the authorization is terminated or revoked sooner. Performed at [facility], [place] [address] [place], [place] [address]   Urine rapid drug screen (hosp performed)  Status: Abnormal   Collection Time: 06/20/19  6:26 PM  Result Value Ref Range   Opiates POSITIVE (A) NONE DETECTED   Cocaine NONE DETECTED NONE DETECTED   Benzodiazepines NONE DETECTED NONE DETECTED   Amphetamines NONE DETECTED NONE DETECTED   Tetrahydrocannabinol NONE DETECTED NONE DETECTED   Barbiturates NONE DETECTED NONE DETECTED    Comment: (NOTE) DRUG SCREEN FOR MEDICAL PURPOSES ONLY.  IF CONFIRMATION IS NEEDED FOR ANY PURPOSE, NOTIFY LAB WITHIN 5 DAYS. LOWEST DETECTABLE LIMITS FOR URINE DRUG SCREEN Drug Class                     Cutoff (ng/mL) Amphetamine and metabolites    1000 Barbiturate and metabolites    200 Benzodiazepine                 200 Tricyclics and metabolites     300 Opiates and metabolites        300 Cocaine and metabolites        300 THC                            50 Performed at [facility], [address] [place], [place] [address]   Basic metabolic panel     Status: Abnormal   Collection Time: 06/21/19  5:25 AM  Result Value Ref Range   Sodium 140 135 - 145 mmol/L   Potassium 3.9 3.5 - 5.1 mmol/L   Chloride 103 98 - 111 mmol/L   CO2 27 22 - 32 mmol/L   Glucose, Bld 109 (H) 70 - 99 mg/dL   BUN 13 8 - 23 mg/dL   Creatinine, Ser 6.040.47 0.44 - 1.00 mg/dL   Calcium 9.0 8.9 - 54.010.3 mg/dL   GFR calc non Af Amer >60 >60 mL/min   GFR calc Af Amer >60 >60 mL/min   Anion gap 10 5 - 15    Comment: Performed at [facility], [address] [place], [place] [address]  Magnesium     Status: None    Collection Time: 06/21/19  5:25 AM  Result Value Ref Range   Magnesium 2.1 1.7 - 2.4 mg/dL    Comment: Performed at [facility], [address] [place], [place] [address]  CBC     Status: Abnormal   Collection Time: 06/21/19  5:25 AM  Result Value Ref Range   WBC 6.1 4.0 - 10.5 K/uL   RBC 4.13 3.87 - 5.11 MIL/uL   Hemoglobin 11.5 (L) 12.0 - 15.0 g/dL   HCT 95.637.6 21.336.0 - 08.646.0 %   MCV 91.0 80.0 - 100.0 fL   MCH 27.8 26.0 - 34.0 pg   MCHC 30.6 30.0 - 36.0 g/dL   RDW 57.814.4 46.911.5 - 62.915.5 %   Platelets 210 150 - 400 K/uL   nRBC 0.0 0.0 - 0.2 %    Comment: Performed at [facility], [address] [place], [place] [address]    Mr Lumbar Spine W Wo Contrast  Result Date: 06/20/2019 CLINICAL DATA:  Chronic low back pain and left leg numbness. EXAM: MRI LUMBAR SPINE WITHOUT AND WITH CONTRAST TECHNIQUE: Multiplanar and multiecho pulse sequences of the lumbar spine were obtained without and with intravenous contrast. CONTRAST:  7 mL GADAVIST IV SOLN COMPARISON:  MRI lumbar spine 08/02/2018. FINDINGS: Segmentation:  Standard. Alignment: Convex right scoliosis is noted. There is trace retrolisthesis L3 on L4 and L4 on L5. Trace anterolisthesis L5 on S1 is noted. Vertebrae:  Extensive endplate destructive change is present at the L2-3 level. There is fluid in the disc interspace and intense edema and enhancement throughout both vertebral bodies consistent with discitis and osteomyelitis. Paraspinous inflammatory change is seen but no rim enhancing fluid collection is identified. There is epidural phlegmon and disc at L2-3 causing moderate to moderately severe central canal stenosis and left worse than right subarticular recess narrowing. Severe bilateral foraminal narrowing is also present. Conus medullaris and cauda equina: Conus extends to the L2 level. Conus and cauda equina appear normal. Paraspinal and other soft tissues: As described above. Otherwise unremarkable. Disc levels: T10-11 is imaged in the  sagittal plane only. There is loss of disc space height and a minimal bulge without stenosis. T11-12: Minimal disc bulge without stenosis. T12-L1: Loss of disc space height and a shallow bulge without stenosis. L1-2: Shallow disc bulge and endplate spur without stenosis. L2-3: As described above. The appearance is worse than on the prior MRI. Central canal and foraminal stenosis is worse than on the prior MRI. L3-4: Mild disc bulge and mild-to-moderate facet arthropathy. Mild central canal and bilateral foraminal narrowing. No change. L4-5: Moderate to advanced bilateral facet degenerative disease and a shallow disc bulge. The central canal is open. Mild to moderate foraminal narrowing is worse on the right. No change. L5-S1: Advanced bilateral facet degenerative disease. The disc is uncovered with a shallow bulge. There is mild central canal and right foraminal narrowing. The left foramen is open. 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 Epidural phlegmon and disc cause moderate to moderately severe central canal stenosis and marked bilateral foraminal narrowing. The appearance is much worse than on the prior MRI. These results will be called to the ordering clinician or representative by the Radiologist Assistant, and communication documented in the PACS or zVision Dashboard. Electronically Signed   By: Drusilla Kanner M.D.   On: 06/20/2019 08:14   Impression/Plan   68 y.o. female with chronic back pain now noted to have L2-3 diskitis osteomyelitis.  There is resultant moderate to moderate least severe central canal stenosis and bilateral foraminal narrowing due to a combination of degenerative disc bulge an epidural phlegmon.  She is neurologically intact. No increase in chronic back pain that is currently managed with  oxy q 3 hours.  Patient does have not have any need for emergent neurosurgical intervention.  Agree with ID consult for  antibiotics, IR consult for aspiration.  Because she is already established with a spine surgeon, I recommend outpatient follow-up with Dr. Noel Gerold at discharge who can continue to manage her spinal issue.  We will sign off.  Please call for any concerns.  Cindra Presume, PA-C [facility]

## 2019-06-21 NOTE — Plan of Care (Signed)
Patient able to verbalize why she was transferred to [facility] from [facility]. All questions answered.

## 2019-06-21 NOTE — Consult Note (Signed)
Chief Complaint: Patient was seen in consultation today for L2-L3 discitis/aspiration.  Referring Physician(s): Marguerita Merles Latif  Supervising Physician: Julieanne Cotton  Patient Status: [facility]  History of Present Illness: Shawna Lewis is a 68 y.o. female with a past medical history of hypertension, migraines, DDD, fibromyalgia, and arthritis. She presented to [facility] ED 06/20/2019 with complaint of back pain. Of note, patient states she fell about 1 year ago and has since had low back pain. States she has been followed by neurology/neurosurgery. She had a follow-up MR lumbar spine done as OP 06/19/2019 which revealed discitis/osteomyelitis. She was advised by her neurologist/neurosurgeon to head to the ED for further evaluation. In ED, neurosurgery was consulted who recommended transfer and admission to [facility] for further evaluation. Once patient arrived to [facility], she was seen by neurosurgery who states patient is neurologically intact, therefore no need for emergent neurosurgical intervention- recommended IR consultation for possible aspiration. ID was also consulted, who also recommends IR consultation for possible aspiration.  MR lumbar spine 06/19/2019: 1. Discitis and osteomyelitis at L2-3 with associated extensive endplate destructive change. Epidural phlegmon and disc cause moderate to moderately severe central canal stenosis and marked bilateral foraminal narrowing. The appearance is much worse than on the prior MRI.  IR consulted by Dr. Marland Mcalpine for possible image-guided L2-L3 disc aspiration. Patient awake and alert walking around her room. Accompanied by husband. Complains of low back pain, rated 3/10 at this time. States pain radiates down left thigh. Denies numbness/tingling down bilateral legs. Denies fever, chills, chest pain, dyspnea, abdominal pain, or headache.   Past Medical History:  Diagnosis Date   Arthritis    DDD (degenerative disc disease), lumbar     Fibromyalgia    Heart murmur    Hypertension    Migraine     Past Surgical History:  Procedure Laterality Date   ANKLE FRACTURE SURGERY  2018   BREAST BIOPSY  1995   CATARACT EXTRACTION     CESAREAN SECTION  1986   KNEE ARTHROSCOPY W/ MENISCAL REPAIR     TONSILLECTOMY  1974    Allergies: Latex  Medications: Prior to Admission medications   Medication Sig Start Date End Date Taking? Authorizing Provider  baclofen (LIORESAL) 20 MG tablet Take 20 mg by mouth 3 (three) times daily. 06/10/19  Yes [provider]  diclofenac (VOLTAREN) 75 MG EC tablet Take 75 mg by mouth daily.  11/02/16  Yes [provider]  felodipine (PLENDIL) 2.5 MG 24 hr tablet Take 2.5 mg by mouth daily.  11/02/16  Yes [provider]  fluvoxaMINE (LUVOX) 100 MG tablet Take 100 mg by mouth at bedtime.  10/05/16  Yes [provider]  FUROSEMIDE PO Take by mouth daily as needed.   Yes [provider]  Oxycodone HCl 10 MG TABS Take 10 mg by mouth every 4 (four) hours as needed.  10/12/16  Yes [provider]  PHENobarbital (LUMINAL) 64.8 MG tablet Take 64.8 mg by mouth daily as needed. 1.5 tabs at onset of migraine as needed   Yes [provider]  pramipexole (MIRAPEX) 0.25 MG tablet Take 0.25 mg by mouth daily.   Yes [provider]  aspirin EC 81 MG tablet Take 81 mg by mouth daily.    [provider]     Family History  Problem Relation Age of Onset   COPD Mother    Heart disease Mother    Lymphoma Mother    Migraines Mother  Pancreatic cancer Father    Neuropathy Neg Hx     Social History   Socioeconomic History   Marital status: Married    Spouse name: Not on file   Number of children: 1   Years of education: Master's   Highest education level: Not on file  Occupational History   Occupation: Presenter, [employer]  Social Needs   Financial resource strain: Not on file   Food insecurity     Worry: Not on file    Inability: Not on file   Transportation needs    Medical: Not on file    Non-medical: Not on file  Tobacco Use   Smoking status: Former Smoker   Smokeless tobacco: Never Used  Substance and Sexual Activity   Alcohol use: Yes    Comment: 1-2 times per yr   Drug use: No   Sexual activity: Not on file  Lifestyle   Physical activity    Days per week: Not on file    Minutes per session: Not on file   Stress: Not on file  Relationships   Social connections    Talks on phone: Not on file    Gets together: Not on file    Attends religious service: Not on file    Active member of club or organization: Not on file    Attends meetings of clubs or organizations: Not on file    Relationship status: Not on file  Other Topics Concern   Not on file  Social History Narrative   Lives at home w/ her husband   Right-handed   Caffeine: 0-1 cup of tea per day     Review of Systems: A 12 point ROS discussed and pertinent positives are indicated in the HPI above.  All other systems are negative.  Review of Systems  Constitutional: Negative for chills and fever.  Respiratory: Negative for shortness of breath and wheezing.   Cardiovascular: Negative for chest pain and palpitations.  Gastrointestinal: Negative for abdominal pain.  Musculoskeletal: Positive for back pain.  Neurological: Negative for numbness.  Psychiatric/Behavioral: Negative for behavioral problems and confusion.    Vital Signs: BP (!) 151/64 (BP Location: Left Arm)    Pulse 66    Temp 98.9 F (37.2 C) (Oral)    Resp 16    Ht 5\' 3"  (1.6 m)    Wt 195 lb (88.5 kg)    SpO2 98%    BMI 34.54 kg/m   Physical Exam Vitals signs and nursing note reviewed.  Constitutional:      General: She is not in acute distress.    Appearance: Normal appearance.  Cardiovascular:     Rate and Rhythm: Normal rate and regular rhythm.     Heart sounds: Normal heart sounds. No murmur.  Pulmonary:     Effort:  Pulmonary effort is normal. No respiratory distress.     Breath sounds: Normal breath sounds. No wheezing.  Musculoskeletal:     Comments: Mild back pain at midline lower spine.  Skin:    General: Skin is warm and dry.  Neurological:     Mental Status: She is alert and oriented to person, place, and time.      MD Evaluation Airway: WNL Heart: WNL Abdomen: WNL Chest/ Lungs: WNL ASA  Classification: 2 Mallampati/Airway Score: One   Imaging: Mr Lumbar Spine W Wo Contrast  Result Date: 06/20/2019 CLINICAL DATA:  Chronic low back pain and left leg numbness. EXAM: MRI LUMBAR SPINE WITHOUT AND WITH CONTRAST TECHNIQUE:  Multiplanar and multiecho pulse sequences of the lumbar spine were obtained without and with intravenous contrast. CONTRAST:  7 mL GADAVIST IV SOLN COMPARISON:  MRI lumbar spine 08/02/2018. FINDINGS: Segmentation:  Standard. Alignment: Convex right scoliosis is noted. There is trace retrolisthesis L3 on L4 and L4 on L5. Trace anterolisthesis L5 on S1 is noted. Vertebrae: Extensive endplate destructive change is present at the L2-3 level. There is fluid in the disc interspace and intense edema and enhancement throughout both vertebral bodies consistent with discitis and osteomyelitis. Paraspinous inflammatory change is seen but no rim enhancing fluid collection is identified. There is epidural phlegmon and disc at L2-3 causing moderate to moderately severe central canal stenosis and left worse than right subarticular recess narrowing. Severe bilateral foraminal narrowing is also present. Conus medullaris and cauda equina: Conus extends to the L2 level. Conus and cauda equina appear normal. Paraspinal and other soft tissues: As described above. Otherwise unremarkable. Disc levels: T10-11 is imaged in the sagittal plane only. There is loss of disc space height and a minimal bulge without stenosis. T11-12: Minimal disc bulge without stenosis. T12-L1: Loss of disc space height and a shallow  bulge without stenosis. L1-2: Shallow disc bulge and endplate spur without stenosis. L2-3: As described above. The appearance is worse than on the prior MRI. Central canal and foraminal stenosis is worse than on the prior MRI. L3-4: Mild disc bulge and mild-to-moderate facet arthropathy. Mild central canal and bilateral foraminal narrowing. No change. L4-5: Moderate to advanced bilateral facet degenerative disease and a shallow disc bulge. The central canal is open. Mild to moderate foraminal narrowing is worse on the right. No change. L5-S1: Advanced bilateral facet degenerative disease. The disc is uncovered with a shallow bulge. There is mild central canal and right foraminal narrowing. The left foramen is open. 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 Epidural phlegmon and disc cause moderate to moderately severe central canal stenosis and marked bilateral foraminal narrowing. The appearance is much worse than on the prior MRI. These results will be called to the ordering clinician or representative by the Radiologist Assistant, and communication documented in the PACS or zVision Dashboard. Electronically Signed   By: Inge Rise M.D.   On: 06/20/2019 08:14    Labs:  CBC: Recent Labs    06/20/19 1551 06/21/19 0525  WBC 6.3 6.1  HGB 12.1 11.5*  HCT 39.7 37.6  PLT 209 210    COAGS: Recent Labs    06/20/19 1551  INR 1.0    BMP: Recent Labs    06/19/19 1627 06/20/19 1551 06/21/19 0525  NA  --  140 140  K  --  4.2 3.9  CL  --  105 103  CO2  --  26 27  GLUCOSE  --  109* 109*  BUN  --  16 13  CALCIUM  --  9.3 9.0  CREATININE 0.70 0.49 0.47  GFRNONAA  --  >60 >60  GFRAA  --  >60 >60    LIVER FUNCTION TESTS: Recent Labs    06/20/19 1551  BILITOT 0.3  AST 29  ALT 27  ALKPHOS 71  PROT 8.0  ALBUMIN 4.5     Assessment and Plan:  L2-L3 discitis. Plan for image-guided L2-L3 disc aspiration in IR  tentatively for tomorrow 06/22/2019 with Dr. Estanislado Pandy. Patient will be NPO at midnight. Afebrile and WBCs WNL. She does not take blood thinners. INR 1.0 06/20/2019.  Risks and benefits discussed with the patient  including, but not limited to bleeding, infection, damage to adjacent structures or low yield requiring additional tests. All of the patient's questions were answered, patient is agreeable to proceed. Consent signed and in chart.   Thank you for this interesting consult.  I greatly enjoyed meeting Shawna Lewis and look forward to participating in their care.  A copy of this report was sent to the requesting provider on this date.  Electronically Signed: Elwin Mocha, PA-C 06/21/2019, 3:28 PM   I spent a total of 40 Minutes in face to face in clinical consultation, greater than 50% of which was counseling/coordinating care for L2-L3 discitis/aspiration.

## 2019-06-21 NOTE — ED Notes (Signed)
[facility] at bedside 

## 2019-06-21 NOTE — Progress Notes (Signed)
PROGRESS NOTE [facility]   Shawna Lewis  WUJ:[id number]  DOB: [birth year]  DOA: 06/20/2019 PCP: Kari BaarsHawkins, Edward, MD   Brief Admission Hx: 68 year old female who fell approximately 1 year ago has been having difficulty with chronic lower back pain radiating down to the left leg was seen recently by neurosurgery and had an MRI with findings positive for discitis/osteomyelitis at L2/L3 and she was advised to come to the ED.  After collaboration with her care team including ID neurosurgery and internal medicine she will be admitted to [facility] to have an IR biopsy of the area prior to starting antibiotics, subsequent neurosurgery and ID consultations.   MDM/Assessment & Plan:   1. Discitis/osteomyelitis of L2/L3 with associated extensive endplate destructive changes. After collaboration with ID, neurosurgery decision was made to obtain IR guided lumbar aspiration prior to initiation of antibiotics.  After the biopsy Dr. Drue SecondSnider recommended initiation of vancomycin and cefepime.  The patient is currently n.p.o. awaiting transfer to [facility] for procedure.  Neurosurgery will consult on the patient at [facility].  IV fentanyl ordered as needed for pain.  Follow CBC and sed rate and CRP. 2. Essential hypertension-currently uncontrolled-her oral blood pressure medications were temporarily held while NPO but planning to resume later today after IR procedure.  IV labetalol ordered as needed for elevated BP readings. 3. Chronic low back pain with radiculopathy -patient has radiological findings of degenerative disc disease of the lumbar spine associated with central canal stenosis and bilateral foraminal narrowing and chronic back pain has persisted for the last 2 to 3 years.  She has IV fentanyl ordered for pain.  Her pain is well-managed at this time with ambulation.  She has no acute focal neurological deficits or red flag symptoms. 4. Migraine headaches-chronic and stable.  DVT prophylaxis:  Ambulation, SCD, holding blood thinners in anticipation of biopsy Code Status: Full Family Communication: Patient fully updated at bedside and verbalized understanding Disposition Plan: Inpatient medical, transfer to [facility] as noted above   Consultants:  Neurosurgery  ID telephone consult  Procedures:  Pending IR lumbar biopsy  Antimicrobials:  N/A  Subjective: Patient reports that she is not having any pain as she has been ambulating around in the room and that seems to help her symptoms.  She has had no loss of bowel or bladder control.  She agrees to transfer to [facility] for the procedures and consults.  Objective: Vitals:   06/20/19 1533 06/20/19 1700 06/20/19 1730 06/21/19 0750  BP: (!) 197/84 (!) 192/77 (!) 175/75 (!) 175/93  Pulse: 70 69 68 73  Resp: 15 (!) 8 (!) 9 18  Temp:      TempSrc:      SpO2: 99% 97% 95% 96%  Weight: 88.5 kg     Height: 5\' 3"  (1.6 m)      No intake or output data in the 24 hours ending 06/21/19 0824 Filed Weights   06/20/19 1347 06/20/19 1533  Weight: 88.5 kg 88.5 kg    REVIEW OF SYSTEMS  As per history otherwise all reviewed and reported negative  Exam:  General exam: Well-developed well-nourished female awake alert in no apparent distress cooperative and pleasant.  Walking around in the room. Respiratory system: Clear. No increased work of breathing. Cardiovascular system: S1 & S2 heard. No JVD, murmurs, gallops, clicks or pedal edema. Gastrointestinal system: Abdomen is nondistended, soft and nontender. Normal bowel sounds heard. Central nervous system: Alert and oriented. No gross focal neurological deficits. Extremities: no  CCE.  Data Reviewed: Basic Metabolic Panel: Recent Labs  Lab 06/19/19 1627 06/20/19 1551 06/21/19 0525  NA  --  140 140  K  --  4.2 3.9  CL  --  105 103  CO2  --  26 27  GLUCOSE  --  109* 109*  BUN  --  16 13  CREATININE 0.70 0.49 0.47  CALCIUM  --  9.3 9.0  MG  --   --  2.1   Liver  Function Tests: Recent Labs  Lab 06/20/19 1551  AST 29  ALT 27  ALKPHOS 71  BILITOT 0.3  PROT 8.0  ALBUMIN 4.5   No results for input(s): LIPASE, AMYLASE in the last 168 hours. No results for input(s): AMMONIA in the last 168 hours. CBC: Recent Labs  Lab 06/20/19 1551 06/21/19 0525  WBC 6.3 6.1  NEUTROABS 3.6  --   HGB 12.1 11.5*  HCT 39.7 37.6  MCV 91.1 91.0  PLT 209 210   Cardiac Enzymes: No results for input(s): CKTOTAL, CKMB, CKMBINDEX, TROPONINI in the last 168 hours. CBG (last 3)  No results for input(s): GLUCAP in the last 72 hours. Recent Results (from the past 240 hour(s))  Blood Culture (routine x 2)     Status: None (Preliminary result)   Collection Time: 06/20/19  3:40 PM   Specimen: BLOOD  Result Value Ref Range Status   Specimen Description BLOOD  Final   Special Requests NONE  Final   Culture   Final    NO GROWTH < 24 HOURS Performed at [facility], [address] [place], [place] [address]    Report Status PENDING  Incomplete  Blood Culture (routine x 2)     Status: None (Preliminary result)   Collection Time: 06/20/19  3:51 PM   Specimen: BLOOD  Result Value Ref Range Status   Specimen Description BLOOD  Final   Special Requests NONE  Final   Culture   Final    NO GROWTH < 24 HOURS Performed at [facility], [address] [place], [place] [address]    Report Status PENDING  Incomplete  SARS CORONAVIRUS 2 (TAT 6-24 HRS) Nasopharyngeal Nasopharyngeal Swab     Status: None   Collection Time: 06/20/19  5:40 PM   Specimen: Nasopharyngeal Swab  Result Value Ref Range Status   SARS Coronavirus 2 NEGATIVE NEGATIVE Final    Comment: (NOTE) SARS-CoV-2 target nucleic acids are NOT DETECTED. The SARS-CoV-2 RNA is generally detectable in upper and lower respiratory specimens during the acute phase of infection. Negative results do not preclude SARS-CoV-2 infection, do not rule out co-infections with other pathogens, and should not be used as the  sole basis for treatment or other patient management decisions. Negative results must be combined with clinical observations, patient history, and epidemiological information. The expected result is Negative. Fact Sheet for Patients: HairSlick.no Fact Sheet for Healthcare Providers: quierodirigir.com This test is not yet approved or cleared by the [place] FDA and  has been authorized for detection and/or diagnosis of SARS-CoV-2 by FDA under an Emergency Use Authorization (EUA). This EUA will remain  in effect (meaning this test can be used) for the duration of the COVID-19 declaration under Section 56 4(b)(1) of the Act, 21 U.S.C. section 360bbb-3(b)(1), unless the authorization is terminated or revoked sooner. Performed at [facility], [address] [place], [place] [address]      Studies: Mr Lumbar Spine W Wo Contrast  Result Date: 06/20/2019 CLINICAL DATA:  Chronic low back pain and left leg numbness. EXAM: MRI LUMBAR SPINE WITHOUT AND WITH CONTRAST TECHNIQUE: Multiplanar and multiecho pulse sequences of the lumbar spine were obtained without and with intravenous contrast. CONTRAST:  7 mL GADAVIST IV SOLN COMPARISON:  MRI lumbar spine 08/02/2018. FINDINGS: Segmentation:  Standard. Alignment: Convex right scoliosis is noted. There is trace retrolisthesis L3 on L4 and L4 on L5. Trace anterolisthesis L5 on S1 is noted. Vertebrae: Extensive endplate destructive change is present at the L2-3 level. There is fluid in the disc interspace and intense edema and enhancement throughout both vertebral bodies consistent with discitis and osteomyelitis. Paraspinous inflammatory change is seen but no rim enhancing fluid collection is identified. There is epidural phlegmon and disc at L2-3 causing moderate to moderately severe central canal stenosis and left worse than right subarticular recess narrowing. Severe bilateral foraminal  narrowing is also present. Conus medullaris and cauda equina: Conus extends to the L2 level. Conus and cauda equina appear normal. Paraspinal and other soft tissues: As described above. Otherwise unremarkable. Disc levels: T10-11 is imaged in the sagittal plane only. There is loss of disc space height and a minimal bulge without stenosis. T11-12: Minimal disc bulge without stenosis. T12-L1: Loss of disc space height and a shallow bulge without stenosis. L1-2: Shallow disc bulge and endplate spur without stenosis. L2-3: As described above. The appearance is worse than on the prior MRI. Central canal and foraminal stenosis is worse than on the prior MRI. L3-4: Mild disc bulge and mild-to-moderate facet arthropathy. Mild central canal and bilateral foraminal narrowing. No change. L4-5: Moderate to advanced bilateral facet degenerative disease and a shallow disc bulge. The central canal is open. Mild to moderate foraminal narrowing is worse on the right. No change. L5-S1: Advanced bilateral facet degenerative disease. The disc is uncovered with a shallow bulge. There is mild central canal and right foraminal narrowing. The left foramen is open. 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 Epidural phlegmon and disc cause moderate to moderately severe central canal stenosis and marked bilateral foraminal narrowing. The appearance is much worse than on the prior MRI. These results will be called to the ordering clinician or representative by the Radiologist Assistant, and communication documented in the PACS or zVision Dashboard. Electronically Signed   By: Inge Rise M.D.   On: 06/20/2019 08:14    Scheduled Meds: . fluvoxaMINE  100 mg Oral QHS  . sodium chloride flush  3 mL Intravenous Q12H   Continuous Infusions:  Principal Problem:   Diskitis Active Problems:   Osteomyelitis (HCC)   HTN (hypertension)   DDD (degenerative disc disease), lumbar    Migraine   Chronic low back pain   Central stenosis of spinal canal  Time spent:   Irwin Brakeman, MD [facility] 06/21/2019, 8:24 AM    LOS: 1 day  How to contact the [facility] Attending or Consulting provider [place] or covering provider during after hours [place], for this patient?  1. Check the care team in [facility] and look for a) attending/consulting [facility] provider listed and b) the [facility] team listed 2. Log into [email or web address] and use [facility] universal password to access. If you do not have the password, please contact the hospital operator. 3. Locate the [facility] provider you are looking for under [facility] and page to a number that you can be directly reached. 4. If you still have difficulty reaching the provider, please page the [facility] (Director on Call) for  the Hospitalists listed on amion for assistance.

## 2019-06-21 NOTE — Progress Notes (Signed)
Patient was seen earlier this morning by my partner and colleague Dr. Irwin Brakeman and I am in current agreement with his assessment and plan.  Additional changes to the plan of care will be made accordingly.  The patient is a 68 year old Caucasian female who fell about a year ago started having difficulty with lower back pain and was recently seen by neurosurgery and had an MRI with findings positive for discitis/osteomyelitis at L2-L3.  She was advised to come to the ED and she presented to [facility] emergency room and she will be transferred to [facility] to have an IR biopsy and be evaluated by neurosurgery.  Currently antibiotics have been held prior to her IR biopsy.  Dr. Wynetta Emery has discussed with neurosurgery as well as talked with infectious diseases.  Once she has her IR biopsy we will start the patient on empiric antibiotics with IV vancomycin and IV cefepime ID will be formally consulted.  Currently awaiting arrival from [facility] to have her IR biopsy done and be evaluated by neurosurgery.

## 2019-06-22 ENCOUNTER — Inpatient Hospital Stay: Payer: Self-pay

## 2019-06-22 ENCOUNTER — Inpatient Hospital Stay (HOSPITAL_COMMUNITY): Payer: Medicare Other

## 2019-06-22 DIAGNOSIS — E876 Hypokalemia: Secondary | ICD-10-CM

## 2019-06-22 DIAGNOSIS — R739 Hyperglycemia, unspecified: Secondary | ICD-10-CM

## 2019-06-22 DIAGNOSIS — D649 Anemia, unspecified: Secondary | ICD-10-CM

## 2019-06-22 HISTORY — PX: IR LUMBAR DISC ASPIRATION W/IMG GUIDE: IMG5306

## 2019-06-22 LAB — COMPREHENSIVE METABOLIC PANEL
ALT: 25 U/L (ref 0–44)
AST: 23 U/L (ref 15–41)
Albumin: 3.8 g/dL (ref 3.5–5.0)
Alkaline Phosphatase: 62 U/L (ref 38–126)
Anion gap: 12 (ref 5–15)
BUN: 11 mg/dL (ref 8–23)
CO2: 28 mmol/L (ref 22–32)
Calcium: 9.1 mg/dL (ref 8.9–10.3)
Chloride: 102 mmol/L (ref 98–111)
Creatinine, Ser: 0.52 mg/dL (ref 0.44–1.00)
GFR calc Af Amer: >60 mL/min (ref >60)
GFR calc non Af Amer: >60 mL/min (ref >60)
Glucose, Bld: 101 mg/dL — ABNORMAL HIGH (ref 70–99)
Potassium: 3.4 mmol/L — ABNORMAL LOW (ref 3.5–5.1)
Sodium: 142 mmol/L (ref 135–145)
Total Bilirubin: 0.7 mg/dL (ref 0.3–1.2)
Total Protein: 6.9 g/dL (ref 6.5–8.1)

## 2019-06-22 LAB — CBC WITH DIFFERENTIAL/PLATELET
Abs Immature Granulocytes: 0.01 10*3/uL (ref 0.00–0.07)
Basophils Absolute: 0.1 10*3/uL (ref 0.0–0.1)
Basophils Relative: 1 %
Eosinophils Absolute: 0.3 10*3/uL (ref 0.0–0.5)
Eosinophils Relative: 5 %
HCT: 35.6 % — ABNORMAL LOW (ref 36.0–46.0)
Hemoglobin: 11.3 g/dL — ABNORMAL LOW (ref 12.0–15.0)
Immature Granulocytes: 0 %
Lymphocytes Relative: 51 %
Lymphs Abs: 3.4 10*3/uL (ref 0.7–4.0)
MCH: 27.8 pg (ref 26.0–34.0)
MCHC: 31.7 g/dL (ref 30.0–36.0)
MCV: 87.7 fL (ref 80.0–100.0)
Monocytes Absolute: 0.6 10*3/uL (ref 0.1–1.0)
Monocytes Relative: 9 %
Neutro Abs: 2.2 10*3/uL (ref 1.7–7.7)
Neutrophils Relative %: 34 %
Platelets: 204 10*3/uL (ref 150–400)
RBC: 4.06 MIL/uL (ref 3.87–5.11)
RDW: 14.3 % (ref 11.5–15.5)
WBC: 6.6 10*3/uL (ref 4.0–10.5)
nRBC: 0 % (ref 0.0–0.2)

## 2019-06-22 LAB — SEDIMENTATION RATE: Sed Rate: 20 mm/hr (ref 0–22)

## 2019-06-22 LAB — MAGNESIUM: Magnesium: 2.1 mg/dL (ref 1.7–2.4)

## 2019-06-22 LAB — PHOSPHORUS: Phosphorus: 4.2 mg/dL (ref 2.5–4.6)

## 2019-06-22 MED ORDER — MIDAZOLAM HCL 2 MG/2ML IJ SOLN
INTRAMUSCULAR | Status: AC
  Filled 2019-06-22: qty 2

## 2019-06-22 MED ORDER — MIDAZOLAM HCL 2 MG/2ML IJ SOLN
INTRAMUSCULAR | Status: AC | PRN
  Administered 2019-06-22: 1 mg via INTRAVENOUS

## 2019-06-22 MED ORDER — FENTANYL CITRATE (PF) 100 MCG/2ML IJ SOLN
INTRAMUSCULAR | Status: AC
  Filled 2019-06-22: qty 2

## 2019-06-22 MED ORDER — VANCOMYCIN HCL 10 G IV SOLR
1500.0000 mg | Freq: Once | INTRAVENOUS | Status: AC
  Administered 2019-06-22: 1500 mg via INTRAVENOUS
  Filled 2019-06-22: qty 1500

## 2019-06-22 MED ORDER — VANCOMYCIN HCL 10 G IV SOLR
1250.0000 mg | Freq: Two times a day (BID) | INTRAVENOUS | Status: DC
  Administered 2019-06-22 – 2019-06-25 (×6): 1250 mg via INTRAVENOUS
  Filled 2019-06-22 (×8): qty 1250

## 2019-06-22 MED ORDER — BUPIVACAINE HCL (PF) 0.5 % IJ SOLN
INTRAMUSCULAR | Status: AC
  Filled 2019-06-22: qty 30

## 2019-06-22 MED ORDER — BUPIVACAINE HCL 0.25 % IJ SOLN
INTRAMUSCULAR | Status: AC | PRN
  Administered 2019-06-22: 10 mL

## 2019-06-22 MED ORDER — FENTANYL CITRATE (PF) 100 MCG/2ML IJ SOLN
INTRAMUSCULAR | Status: AC | PRN
  Administered 2019-06-22: 25 ug via INTRAVENOUS

## 2019-06-22 MED ORDER — SODIUM CHLORIDE 0.9 % IV SOLN
2.0000 g | INTRAVENOUS | Status: DC
  Administered 2019-06-22 – 2019-06-25 (×4): 2 g via INTRAVENOUS
  Filled 2019-06-22 (×3): qty 2
  Filled 2019-06-22: qty 20
  Filled 2019-06-22: qty 2

## 2019-06-22 MED ORDER — OXYCODONE HCL 5 MG PO TABS
10.0000 mg | ORAL_TABLET | ORAL | Status: DC | PRN
  Administered 2019-06-22 – 2019-06-25 (×16): 10 mg via ORAL
  Filled 2019-06-22 (×16): qty 2

## 2019-06-22 MED ORDER — SODIUM CHLORIDE 0.9 % IV SOLN
INTRAVENOUS | Status: AC
  Administered 2019-06-22: 18:00:00 via INTRAVENOUS

## 2019-06-22 NOTE — Plan of Care (Signed)
Patient ask appropriate questions about hospital admission. Patient verbalize understanding plan of care.

## 2019-06-22 NOTE — Progress Notes (Signed)
PROGRESS NOTE    Shawna Lewis  WER:[id number] DOB: Mar 18, 1951 DOA: 06/20/2019 PCP: Sinda Du, MD   Brief Narrative:  HPI per Dr. Babs Bertin on 06/20/2019 Shawna Lewis is a 68 y.o. female with medical history significant for chronic low back pain, degenerative disc disease of the lumbar spine, hypertension, who is admitted to [facility] on 06/20/2019 with osteomyelitis/discitis of L2-L3 after presenting from home to [facility] emergency department at the direction of her neurosurgeon following result of abnormal outpatient MRI of the lumbar spine.   The patient reports a history of chronic low back pain over the last 2 to 3 years in the setting of degenerative disc disease involving the lumbar spine as well as central canal stenosis.  In this context, the patient reports that she follows with Dr. Jaynee Eagles of neurology as well as Dr. Patrice Paradise, both of which are associated with [facility] medical.  In the setting of chronic, nonescalating, low back pain, the patient reports that she underwent routine follow-up MRI of the lumbar spine as an outpatient earlier today (06/20/19), as ordered by her outpatient neurosurgeon, Dr. Patrice Paradise.  This MRI, which was performed with and without contrast demonstrated osteomyelitis/discitis at L2-L3, epidural phlegmon, moderate central canal stenosis, and bilateral foraminal narrowing.  The patient reports that she was called with these results by Dr. Patrice Paradise, who recommended presentation to the emergency department for further evaluation.   The patient reports no worsening of the intensity of her low back pain over the course of the last year.  She reports chronic numbness over the dorsal and plantar aspects of the bilateral feet, which is unchanged over the last 2 to 3 years.  Denies any associated saddle anesthesia.  She also denies any recent weakness involving the lower extremities.  Additionally, she denies any recent fecal incontinence or urinary  retention.  Denies any trauma over the last 1 year.  She denies any recent subjective fever, chills, rigors, or generalized myalgias.  She also denies any headache, neck stiffness, sore throat, cough, nausea, vomiting, abdominal pain, diarrhea, or dysuria.  Denies any recent traveling or known positive COVID-19 exposures.  She denies any recent CNS procedures, and denies any history of intravenous drug use.  Additionally, the patient reports that she is on no blood thinning agents at home, including no aspirin, in spite of her home medication list including this anti-platelet med.    ED Course: Vital signs in the emergency department were notable for the following: Temperature max 99.5; heart rate 70-77; blood pressure 197/84; respiratory rate 15-18, and oxygen saturation 97 to 99% on room air.  Labs in the ED were notable for the following: CMP notable for bicarbonate 26, creatinine 0.49, glucose 109.  CBC notable for white blood cell count of 6300 with 58% neutrophils.  Lactic acid 1.0.  CRP 0.8.  ESR 22.  Urinalysis showed no white blood cells, and was leukocyte esterase/nitrite negative.  Nasopharyngeal COVID-19 swab was performed in the ED today, with result currently pending.  Blood cultures x2 are also collected in the ED today prior to the initiation of any antibiotics.   The patient's case and imaging were discussed with the APP for Dr. Kathyrn Sheriff of neurosurgery, who recommended admission to the hospital service with consultation of infectious disease as well as interventional radiology, with plan for neurosurgery to formally consult on the patient in the morning.  The patient's case was also discussed with the on-call infectious disease physician, Dr. Baxter Flattery, who recommended  lumbar aspiration and cultures to be performed by interventional radiology. Dr. Baxter Flattery recommended that antibiotics be held until these cultures are obtained, following which he recommended the initiation of vancomycin as  well as cefepime.  Subsequently, case was discussed with Dr. Laurence Ferrari of interventional radiology, who recommended IR-guided lumbar aspiration at [facility] on the morning of 06/21/2019. Dr. Laurence Ferrari asked that the patient be n.p.o. after midnight and and that patient no receive any blood thinning agents leading up to this procedure.   Subsequently, the patient was admitted to [facility] for further evaluation and management of presenting osteomyelitis/discitis at L2-L3, including formal neurosurgery consultation, formal infectious disease consultation, and plan for IR-guided lumbar aspiration with associated cultures.  While in [facility] ED, no IV fluids or medications were administered  **Interim History Patient is to still undergo a IR guided biopsy.  Neurosurgery infectious diseases and IR all involved patient's care.  Further management with antibiotics to be started once biopsy has been done  Assessment & Plan:   Principal Problem:   Diskitis Active Problems:   Osteomyelitis (HCC)   HTN (hypertension)   DDD (degenerative disc disease), lumbar   Migraine   Chronic low back pain   Central stenosis of spinal canal   Chronic left lumbar radiculopathy  Discitis/Osteomyelitis of L2/L3 with associated extensive endplate destructive changes. -After collaboration with ID, neurosurgery decision was made to obtain IR guided lumbar aspiration prior to initiation of antibiotics.   -After the biopsy Dr. Baxter Flattery recommended initiation of vancomycin and cefepime but now ID recommending IV Vancomycin/IV Ceftriaxone following her lumbar spine aspiration.  -ID recommending repeating Blood Cx x2 for the next 3 days and this has been ordered -Currently still awaiting her lumbar spine aspiration and biopsy -The patient was transferred to [facility] for procedure and unfortunately not able to be done yesterday but will be done today at some time.   -Neurosurgery consulted on the patient at [facility] and Dr Kathyrn Sheriff felt that because she remains neurologically intact, he recommends medical management because there is no frank abscess that could be addressed surgically and recommends an IR biopsy and follow-up with Dr. Patrice Paradise in the outpatient setting.   -IV fentanyl ordered as needed for pain.  Follow CBC and sed rate and CRP. -Resumed her home dosing of oxycodone as well -Follow-up biopsy results and start antibiotics once biopsy is done  Essential Hypertension -Currently uncontrolled -Her oral blood pressure medications were temporarily held while NPO but planning to resume later today after IR procedure.  -Pressure this morning was 157/68 -IV labetalol ordered as needed for elevated BP readings.  Chronic Low Back Pain with Radiculopathy/Lumbar DDD -Patient has radiological findings of degenerative disc disease of the lumbar spine associated with central canal stenosis and bilateral foraminal narrowing and chronic back pain has persisted for the last 2 to 3 years.   -She has IV fentanyl ordered for pain with 25 mcg every 2 hours as needed for moderate pain.  Also resumed her oxycodone IR 10 mg every 3 hours as needed for breakthrough pain   -Her pain is well-managed at this time with ambulation.  She has no acute focal neurological deficits or red flag symptoms.  Depression and Anxiety/Fibromyalgia -Continue with Fluvoxamine 100 g p.o. nightly  History of migraine headaches -Chronic and stable  Normocytic Anemia -Patient's Hgb/Hct went from 12.1/39.7 -> 11.5/37.6 -> 11.3/35.6 -Check Anemia Panel in the AM -Continue to Monitor for S/Sx of Bleeding; Currently no overt bleeding noted -Repeat  CBC in AM   Hyperglycemia -Patient's Blood sugars have been elevated since admission on Daily BMP/CMP -Ranging from 101-109 -Check HbA1c -Continue to Monitor Blood Sugars Carefully and if necessary will place on Sensitive Novolog SSI AC  Hypokalemia -Patient's K+ this AM was 3.4  -Replete with IV KCl 40 mEQ gvien that she is NPO of IR Bx -Continue to Monitor and Replete as Necessary -Repeat CMP in AM   Obesity -Estimated body mass index is 34.54 kg/m as calculated from the following:   Height as of this encounter: 5' 3"  (1.6 m).   Weight as of this encounter: 88.5 kg. -Weight Loss and Dietary Counseling given -Will consult Nutrition for further evaluation and Recc's  DVT prophylaxis: SCDs Code Status: FULL CODE  Family Communication: No family present at bedside  Disposition Plan: Ending further work-up and biopsy results.  Patient may likely need a PICC line for long-term antibiotic usage  Consultants:   Interventional Radiology  Neurosurgery  Infectious Diseases    Procedures: IR  Antimicrobials: Anti-infectives (From admission, onward)   Start     Dose/Rate Route Frequency Ordered Stop   06/20/19 1600  vancomycin (VANCOCIN) IVPB 1000 mg/200 mL premix  Status:  Discontinued     1,000 mg 200 mL/hr over 60 Minutes Intravenous Every 1 hr x 2 06/20/19 1522 06/20/19 1526   06/20/19 1515  cefTRIAXone (ROCEPHIN) 2 g in sodium chloride 0.9 % 100 mL IVPB  Status:  Discontinued     2 g 200 mL/hr over 30 Minutes Intravenous  Once 06/20/19 1514 06/20/19 1526     Subjective: Seen and examined at bedside and was complaining of some back pain.  No nausea or vomiting.  Still waiting for her IR biopsy of her lumbar spine.  No nausea or vomiting.  Denies any lightheadedness or dizziness.  No other concerns or complaints at this time.  Objective: Vitals:   06/21/19 1038 06/21/19 1616 06/21/19 1955 06/21/19 2320  BP: (!) 151/64 (!) 154/69 (!) 155/62 (!) 145/64  Pulse: 66 70 69 67  Resp: 16 20 17 17   Temp: 98.9 F (37.2 C) 99 F (37.2 C) 98.5 F (36.9 C) 98.2 F (36.8 C)  TempSrc: Oral Oral Oral Oral  SpO2: 98% 97% 95% 96%  Weight:      Height:        Intake/Output Summary (Last 24 hours) at 06/22/2019 0804 Last data filed at 06/21/2019 2100 Gross per  24 hour  Intake 360 ml  Output -  Net 360 ml   Filed Weights   06/20/19 1347 06/20/19 1533  Weight: 88.5 kg 88.5 kg   Examination: Physical Exam:  Constitutional: WN/WD obese Caucasian female in NAD and appears calm  Eyes: Lids and conjunctivae normal, sclerae anicteric  ENMT: External Ears, Nose appear normal. Grossly normal hearing. Mucous membranes are moist.  Neck: Appears normal, supple, no cervical masses, normal ROM, no appreciable thyromegaly; no JVD Respiratory: Diminished to auscultation bilaterally, no wheezing, rales, rhonchi or crackles. Normal respiratory effort and patient is not tachypenic. No accessory muscle use.  Cardiovascular: RRR, no murmurs / rubs / gallops. S1 and S2 auscultated. Non-pitting extremity edema. Abdomen: Soft, non-tender, Distended 2/2 Body habitus. Bowel sounds positive x4.  GU: Deferred. Musculoskeletal: No clubbing / cyanosis of digits/nails. No joint deformity upper and lower extremities. Skin: No rashes, lesions, ulcers on a limited skin evaluation. No induration; Warm and dry.  Neurologic: CN 2-12 grossly intact with no focal deficits.  Romberg sign and cerebellar reflexes not assessed.  Psychiatric: Normal judgment and insight. Alert and oriented x 3. Normal mood and appropriate affect.   Data Reviewed: I have personally reviewed following labs and imaging studies  CBC: Recent Labs  Lab 06/20/19 1551 06/21/19 0525 06/22/19 0406  WBC 6.3 6.1 6.6  NEUTROABS 3.6  --  2.2  HGB 12.1 11.5* 11.3*  HCT 39.7 37.6 35.6*  MCV 91.1 91.0 87.7  PLT 209 210 627   Basic Metabolic Panel: Recent Labs  Lab 06/19/19 1627 06/20/19 1551 06/21/19 0525 06/22/19 0406  NA  --  140 140 142  K  --  4.2 3.9 3.4*  CL  --  105 103 102  CO2  --  26 27 28   GLUCOSE  --  109* 109* 101*  BUN  --  16 13 11   CREATININE 0.70 0.49 0.47 0.52  CALCIUM  --  9.3 9.0 9.1  MG  --   --  2.1 2.1  PHOS  --   --   --  4.2   GFR: Estimated Creatinine Clearance:  72.9 mL/min (by C-G formula based on SCr of 0.52 mg/dL). Liver Function Tests: Recent Labs  Lab 06/20/19 1551 06/22/19 0406  AST 29 23  ALT 27 25  ALKPHOS 71 62  BILITOT 0.3 0.7  PROT 8.0 6.9  ALBUMIN 4.5 3.8   No results for input(s): LIPASE, AMYLASE in the last 168 hours. No results for input(s): AMMONIA in the last 168 hours. Coagulation Profile: Recent Labs  Lab 06/20/19 1551  INR 1.0   Cardiac Enzymes: No results for input(s): CKTOTAL, CKMB, CKMBINDEX, TROPONINI in the last 168 hours. BNP (last 3 results) No results for input(s): PROBNP in the last 8760 hours. HbA1C: No results for input(s): HGBA1C in the last 72 hours. CBG: No results for input(s): GLUCAP in the last 168 hours. Lipid Profile: No results for input(s): CHOL, HDL, LDLCALC, TRIG, CHOLHDL, LDLDIRECT in the last 72 hours. Thyroid Function Tests: No results for input(s): TSH, T4TOTAL, FREET4, T3FREE, THYROIDAB in the last 72 hours. Anemia Panel: No results for input(s): VITAMINB12, FOLATE, FERRITIN, TIBC, IRON, RETICCTPCT in the last 72 hours. Sepsis Labs: Recent Labs  Lab 06/20/19 1550  LATICACIDVEN 1.0    Recent Results (from the past 240 hour(s))  Blood Culture (routine x 2)     Status: None (Preliminary result)   Collection Time: 06/20/19  3:40 PM   Specimen: BLOOD LEFT FOREARM  Result Value Ref Range Status   Specimen Description BLOOD LEFT FOREARM  Final   Special Requests   Final    BOTTLES DRAWN AEROBIC AND ANAEROBIC Blood Culture adequate volume   Culture   Final    NO GROWTH 2 DAYS Performed at [facility], [address] [place], [place] [address]    Report Status PENDING  Incomplete  Blood Culture (routine x 2)     Status: None (Preliminary result)   Collection Time: 06/20/19  3:51 PM   Specimen: BLOOD RIGHT ARM  Result Value Ref Range Status   Specimen Description BLOOD RIGHT ARM  Final   Special Requests   Final    BOTTLES DRAWN AEROBIC AND ANAEROBIC Blood Culture adequate  volume   Culture   Final    NO GROWTH 2 DAYS Performed at [facility], [address] [place], [place] [address]    Report Status PENDING  Incomplete  SARS CORONAVIRUS 2 (TAT 6-24 HRS) Nasopharyngeal Nasopharyngeal Swab     Status: None   Collection Time: 06/20/19  5:40 PM  Specimen: Nasopharyngeal Swab  Result Value Ref Range Status   SARS Coronavirus 2 NEGATIVE NEGATIVE Final    Comment: (NOTE) SARS-CoV-2 target nucleic acids are NOT DETECTED. The SARS-CoV-2 RNA is generally detectable in upper and lower respiratory specimens during the acute phase of infection. Negative results do not preclude SARS-CoV-2 infection, do not rule out co-infections with other pathogens, and should not be used as the sole basis for treatment or other patient management decisions. Negative results must be combined with clinical observations, patient history, and epidemiological information. The expected result is Negative. Fact Sheet for Patients: SugarRoll.be Fact Sheet for Healthcare Providers: https://www.woods-mathews.com/ This test is not yet approved or cleared by the [place] FDA and  has been authorized for detection and/or diagnosis of SARS-CoV-2 by FDA under an Emergency Use Authorization (EUA). This EUA will remain  in effect (meaning this test can be used) for the duration of the COVID-19 declaration under Section 56 4(b)(1) of the Act, 21 U.S.C. section 360bbb-3(b)(1), unless the authorization is terminated or revoked sooner. Performed at [facility], [place] [address] [place], [place] [address]     Radiology Studies: No results found.  Scheduled Meds: . fluvoxaMINE  100 mg Oral QHS  . oxymetazoline  1 spray Each Nare BID  . sodium chloride flush  3 mL Intravenous Q12H   Continuous Infusions:   LOS: 2 days   Kerney Elbe, DO [facility] PAGER is on AMION  If 7PM-7AM, please contact night-coverage  [email or web address]

## 2019-06-22 NOTE — Progress Notes (Signed)
Pharmacy Antibiotic Note  Shawna Lewis is a 68 y.o. female admitted on 06/20/2019 with L2-3 discitis and osteomyelitis. She had a disc aspiration performed this afternoon, Pharmacy has been consulted for vancomycin dosing. WBC WNL and patient afebrile. Scr- 0.52 and CrCl ~ 73 ml/min.   Plan: Vancomycin 1500 mg X 1 Then 1250 mg every 12 hours Estimated AUC 463 with SCr-0.8  Monitor renal function and cultures   Height: 5\' 3"  (160 cm) Weight: 195 lb (88.5 kg) IBW/kg (Calculated) : 52.4  Temp (24hrs), Avg:98.6 F (37 C), Min:98.2 F (36.8 C), Max:98.8 F (37.1 C)  Recent Labs  Lab 06/19/19 1627 06/20/19 1550 06/20/19 1551 06/21/19 0525 06/22/19 0406  WBC  --   --  6.3 6.1 6.6  CREATININE 0.70  --  0.49 0.47 0.52  LATICACIDVEN  --  1.0  --   --   --     Estimated Creatinine Clearance: 72.9 mL/min (by C-G formula based on SCr of 0.52 mg/dL).    Allergies  Allergen Reactions  . Latex     Antimicrobials this admission: 12/3 Vanc>> 12/3 Ceftriaxone>>    Thank you for allowing pharmacy to be a part of this patient's care.  Jimmy Footman, PharmD, BCPS, BCIDP Infectious Diseases Clinical Pharmacist Phone: [phone] 06/22/2019 4:32 PM

## 2019-06-22 NOTE — Procedures (Signed)
S/P L2/L3 disc aspiration . Approx 3 ml of aspirate obtained. S.Cicley Ganesh MD

## 2019-06-22 NOTE — Progress Notes (Signed)
         [place] [place] [facility] for Infectious Disease  Date of Admission:  06/20/2019      Total days of antibiotics 1  Day 1 Vancomycin + Ceftriaxone            ASSESSMENT: Shawna Lewis is a 68 y.o. female with L2-L3 discitis, osteomyelitis and epidural phlegmon now s/p IR guided aspiration with micro pending. Will go ahead and start vancomycin + ceftriaxone and follow cultures.   Will get panorex as well since it has been 5 years since formal dental exam to ensure no potential infectious foci that would have lead to this. Would also like to check surface echo to ensure no endocarditis as her vertebral infection could be a sequela if this is the primary problem.   Will see her in AM 12/4    PLAN: 1. Start IV Vancomycin + ceftriaxone 2. Follow micro cultures 3. TTE 4. Place PICC 12/04. Would be OK to arrange discharge soon and can adjust antibiotic therapy    Principal Problem:   Diskitis Active Problems:   Osteomyelitis ([place])   HTN (hypertension)   DDD (degenerative disc disease), lumbar   Migraine   Chronic low back pain   Central stenosis of spinal canal   Chronic left lumbar radiculopathy    No Charge   Janene Madeira, MSN, NP-C [place] for Infectious Disease [email or web address] Pager: [phone] Office: [phone] [place]: [phone]

## 2019-06-23 ENCOUNTER — Inpatient Hospital Stay (HOSPITAL_COMMUNITY): Payer: Medicare Other

## 2019-06-23 DIAGNOSIS — G8929 Other chronic pain: Secondary | ICD-10-CM

## 2019-06-23 DIAGNOSIS — R7881 Bacteremia: Secondary | ICD-10-CM

## 2019-06-23 DIAGNOSIS — M549 Dorsalgia, unspecified: Secondary | ICD-10-CM

## 2019-06-23 DIAGNOSIS — Z95828 Presence of other vascular implants and grafts: Secondary | ICD-10-CM

## 2019-06-23 LAB — CBC WITH DIFFERENTIAL/PLATELET
Abs Immature Granulocytes: 0.02 10*3/uL (ref 0.00–0.07)
Basophils Absolute: 0.1 10*3/uL (ref 0.0–0.1)
Basophils Relative: 1 %
Eosinophils Absolute: 0.3 10*3/uL (ref 0.0–0.5)
Eosinophils Relative: 3 %
HCT: 35.9 % — ABNORMAL LOW (ref 36.0–46.0)
Hemoglobin: 11.2 g/dL — ABNORMAL LOW (ref 12.0–15.0)
Immature Granulocytes: 0 %
Lymphocytes Relative: 38 %
Lymphs Abs: 3.1 10*3/uL (ref 0.7–4.0)
MCH: 27.9 pg (ref 26.0–34.0)
MCHC: 31.2 g/dL (ref 30.0–36.0)
MCV: 89.5 fL (ref 80.0–100.0)
Monocytes Absolute: 0.8 10*3/uL (ref 0.1–1.0)
Monocytes Relative: 10 %
Neutro Abs: 3.9 10*3/uL (ref 1.7–7.7)
Neutrophils Relative %: 48 %
Platelets: 195 10*3/uL (ref 150–400)
RBC: 4.01 MIL/uL (ref 3.87–5.11)
RDW: 14.2 % (ref 11.5–15.5)
WBC: 8.1 10*3/uL (ref 4.0–10.5)
nRBC: 0 % (ref 0.0–0.2)

## 2019-06-23 LAB — COMPREHENSIVE METABOLIC PANEL
ALT: 24 U/L (ref 0–44)
AST: 25 U/L (ref 15–41)
Albumin: 3.6 g/dL (ref 3.5–5.0)
Alkaline Phosphatase: 53 U/L (ref 38–126)
Anion gap: 12 (ref 5–15)
BUN: 11 mg/dL (ref 8–23)
CO2: 24 mmol/L (ref 22–32)
Calcium: 8.9 mg/dL (ref 8.9–10.3)
Chloride: 104 mmol/L (ref 98–111)
Creatinine, Ser: 0.45 mg/dL (ref 0.44–1.00)
GFR calc Af Amer: >60 mL/min (ref >60)
GFR calc non Af Amer: >60 mL/min (ref >60)
Glucose, Bld: 88 mg/dL (ref 70–99)
Potassium: 3.2 mmol/L — ABNORMAL LOW (ref 3.5–5.1)
Sodium: 140 mmol/L (ref 135–145)
Total Bilirubin: 0.7 mg/dL (ref 0.3–1.2)
Total Protein: 6.5 g/dL (ref 6.5–8.1)

## 2019-06-23 LAB — ECHOCARDIOGRAM COMPLETE
Height: 63 in
Weight: 3379.21 oz

## 2019-06-23 LAB — PH, BODY FLUID: pH, Body Fluid: 7.8

## 2019-06-23 LAB — SEDIMENTATION RATE: Sed Rate: 20 mm/hr (ref 0–22)

## 2019-06-23 LAB — MAGNESIUM: Magnesium: 2 mg/dL (ref 1.7–2.4)

## 2019-06-23 LAB — PHOSPHORUS: Phosphorus: 4.2 mg/dL (ref 2.5–4.6)

## 2019-06-23 MED ORDER — POTASSIUM CHLORIDE CRYS ER 20 MEQ PO TBCR
40.0000 meq | EXTENDED_RELEASE_TABLET | Freq: Two times a day (BID) | ORAL | Status: AC
  Administered 2019-06-23 (×2): 40 meq via ORAL
  Filled 2019-06-23 (×2): qty 2

## 2019-06-23 MED ORDER — PRAMIPEXOLE DIHYDROCHLORIDE 0.25 MG PO TABS
0.2500 mg | ORAL_TABLET | Freq: Every day | ORAL | Status: DC
  Administered 2019-06-23: 0.25 mg via ORAL
  Filled 2019-06-23 (×3): qty 1

## 2019-06-23 MED ORDER — ASPIRIN EC 81 MG PO TBEC
81.0000 mg | DELAYED_RELEASE_TABLET | Freq: Every day | ORAL | Status: DC
  Administered 2019-06-23 – 2019-06-25 (×3): 81 mg via ORAL
  Filled 2019-06-23 (×3): qty 1

## 2019-06-23 MED ORDER — VANCOMYCIN IV (FOR PTA / DISCHARGE USE ONLY): 1250.0000 mg | Freq: Two times a day (BID) | INTRAVENOUS | 9 refills | Status: AC

## 2019-06-23 MED ORDER — CEFTRIAXONE IV (FOR PTA / DISCHARGE USE ONLY): 2.0000 g | INTRAVENOUS | 0 refills | Status: AC

## 2019-06-23 MED ORDER — PHENOBARBITAL 32.4 MG PO TABS: 64.8000 mg | ORAL_TABLET | Freq: Every day | ORAL | Status: DC | PRN

## 2019-06-23 MED ORDER — BACLOFEN 10 MG PO TABS
20.0000 mg | ORAL_TABLET | Freq: Three times a day (TID) | ORAL | Status: DC
  Administered 2019-06-23 – 2019-06-24 (×3): 20 mg via ORAL
  Administered 2019-06-24: 10 mg via ORAL
  Filled 2019-06-23 (×7): qty 2

## 2019-06-23 MED ORDER — FELODIPINE ER 2.5 MG PO TB24
2.5000 mg | ORAL_TABLET | Freq: Every day | ORAL | Status: DC
  Administered 2019-06-23 – 2019-06-25 (×3): 2.5 mg via ORAL
  Filled 2019-06-23 (×3): qty 1

## 2019-06-23 NOTE — TOC Transition Note (Signed)
Transition of Care [facility]) - CM/SW Discharge Note   Patient Details  Name: Shawna Lewis MRN: [id number] Date of Birth: 10/12/1950  [facility] CM/SW Contact:  Pollie Friar, RN Phone Number: 06/23/2019, 11:55 AM   Clinical Narrative:    Pt to d/c home with IV abx. CM met with the patient and provided choice and [facility] selected. Patient also chose to use [facility] for IV abx. Pam with [facility] to meet with the patient to go over IV abx administration.  Pt has assistance at home per her spouse.  Pt has transportation home when medically ready.   Final next level of care: Home w Home Health Services Barriers to Discharge: No Barriers Identified   Patient Goals and CMS Choice   CMS Medicare.gov Compare Post Acute Care list provided to:: Patient Choice offered to / list presented to : Patient  Discharge Placement                       Discharge Plan and Services                          HH Arranged: RN [facility] Agency: [facility] Date [place]: 06/23/19   Representative spoke with at [place]: Cassie  Social Determinants of Health ([place]) Interventions     Readmission Risk Interventions No flowsheet data found.

## 2019-06-23 NOTE — Progress Notes (Signed)
  Echocardiogram 2D Echocardiogram has been performed.  Shawna Lewis 06/23/2019, 9:36 AM

## 2019-06-23 NOTE — Progress Notes (Signed)
PHARMACY CONSULT NOTE FOR:  OUTPATIENT  PARENTERAL ANTIBIOTIC THERAPY (OPAT)  Indication: Discitis Regimen: Ceftriaxone 2 gm IV every 24 hours + Vancomycin IV 1250 mg every 12 hours  End date: 08/03/19  IV antibiotic discharge orders are pended. To discharging provider:  please sign these orders via discharge navigator,  Select New Orders & click on the button choice - Manage This Unsigned Work.     Thank you for allowing pharmacy to be a part of this patient's care.  Jimmy Footman, PharmD, BCPS, BCIDP Infectious Diseases Clinical Pharmacist Phone: [phone] 06/23/2019, 1:08 PM

## 2019-06-23 NOTE — Progress Notes (Signed)
PROGRESS NOTE    Shawna Lewis  HYQ:[id number] DOB: [birth year] DOA: 06/20/2019 PCP: Sinda Du, MD   Brief Narrative:  HPI per Dr. Babs Bertin on 06/20/2019 Shawna Lewis is a 68 y.o. female with medical history significant for chronic low back pain, degenerative disc disease of the lumbar spine, hypertension, who is admitted to [facility] on 06/20/2019 with osteomyelitis/discitis of L2-L3 after presenting from home to [facility] emergency department at the direction of her neurosurgeon following result of abnormal outpatient MRI of the lumbar spine.   The patient reports a history of chronic low back pain over the last 2 to 3 years in the setting of degenerative disc disease involving the lumbar spine as well as central canal stenosis.  In this context, the patient reports that she follows with Dr. Jaynee Eagles of neurology as well as Dr. Patrice Paradise, both of which are associated with [facility] medical.  In the setting of chronic, nonescalating, low back pain, the patient reports that she underwent routine follow-up MRI of the lumbar spine as an outpatient earlier today (06/20/19), as ordered by her outpatient neurosurgeon, Dr. Patrice Paradise.  This MRI, which was performed with and without contrast demonstrated osteomyelitis/discitis at L2-L3, epidural phlegmon, moderate central canal stenosis, and bilateral foraminal narrowing.  The patient reports that she was called with these results by Dr. Patrice Paradise, who recommended presentation to the emergency department for further evaluation.   The patient reports no worsening of the intensity of her low back pain over the course of the last year.  She reports chronic numbness over the dorsal and plantar aspects of the bilateral feet, which is unchanged over the last 2 to 3 years.  Denies any associated saddle anesthesia.  She also denies any recent weakness involving the lower extremities.  Additionally, she denies any recent fecal incontinence or urinary  retention.  Denies any trauma over the last 1 year.  She denies any recent subjective fever, chills, rigors, or generalized myalgias.  She also denies any headache, neck stiffness, sore throat, cough, nausea, vomiting, abdominal pain, diarrhea, or dysuria.  Denies any recent traveling or known positive COVID-19 exposures.  She denies any recent CNS procedures, and denies any history of intravenous drug use.  Additionally, the patient reports that she is on no blood thinning agents at home, including no aspirin, in spite of her home medication list including this anti-platelet med.    ED Course: Vital signs in the emergency department were notable for the following: Temperature max 99.5; heart rate 70-77; blood pressure 197/84; respiratory rate 15-18, and oxygen saturation 97 to 99% on room air.  Labs in the ED were notable for the following: CMP notable for bicarbonate 26, creatinine 0.49, glucose 109.  CBC notable for white blood cell count of 6300 with 58% neutrophils.  Lactic acid 1.0.  CRP 0.8.  ESR 22.  Urinalysis showed no white blood cells, and was leukocyte esterase/nitrite negative.  Nasopharyngeal COVID-19 swab was performed in the ED today, with result currently pending.  Blood cultures x2 are also collected in the ED today prior to the initiation of any antibiotics.   The patient's case and imaging were discussed with the APP for Dr. Kathyrn Sheriff of neurosurgery, who recommended admission to the hospital service with consultation of infectious disease as well as interventional radiology, with plan for neurosurgery to formally consult on the patient in the morning.  The patient's case was also discussed with the on-call infectious disease physician, Dr. Baxter Flattery, who recommended  lumbar aspiration and cultures to be performed by interventional radiology. Dr. Baxter Flattery recommended that antibiotics be held until these cultures are obtained, following which he recommended the initiation of vancomycin as  well as cefepime.  Subsequently, case was discussed with Dr. Laurence Ferrari of interventional radiology, who recommended IR-guided lumbar aspiration at [facility] on the morning of 06/21/2019. Dr. Laurence Ferrari asked that the patient be n.p.o. after midnight and and that patient no receive any blood thinning agents leading up to this procedure.   Subsequently, the patient was admitted to [facility] for further evaluation and management of presenting osteomyelitis/discitis at L2-L3, including formal neurosurgery consultation, formal infectious disease consultation, and plan for IR-guided lumbar aspiration with associated cultures.  While in [facility] ED, no IV fluids or medications were administered  **Interim History Patient underwent IR guided biopsy and was started on IV Vancomycin and Ceftriaxone. Neurosurgery infectious diseases and IR all involved patient's care. ID checking TTE and recommend PICC Placement. Disc Space Aspiration showed No Organisms seen and NGTD <24 Hours so far.  Fortunately she had a dose interruption of her IV vancomycin due to infiltrated IV and I spoke with infectious disease Dr. Prince Rome who feels that we will need to adequately get vancomycin trough level prior to discharge and this will likely be done Sunday.  Anticipating discharge in next 24 to 48 hours and can likely discharge home tomorrow if home health can arrange a trough level on Sunday if not she will be discharged Sunday from the hospital.  Assessment & Plan:   Principal Problem:   Diskitis Active Problems:   Osteomyelitis ([place])   HTN (hypertension)   DDD (degenerative disc disease), lumbar   Migraine   Chronic low back pain   Central stenosis of spinal canal   Chronic left lumbar radiculopathy  Discitis/Osteomyelitis of L2/L3 with associated extensive endplate destructive changes. -After collaboration with ID, neurosurgery decision was made to obtain IR guided lumbar aspiration prior to initiation of  antibiotics.   -After the biopsy Dr. Baxter Flattery recommended initiation of vancomycin and cefepime but now ID recommending IV Vancomycin/IV Ceftriaxone following her lumbar spine aspiration and this has been started -ID recommending repeating Blood Cx x2 for the next 3 days and this has been ordered -Blood cultures on 06/20/2019 showed no growth to date at 3 days -Blood cultures from 06/22/2019 showed NGTD at <24 Hours -Body fluid culture still pending and showed no organisms present showed no growth to date less than 24 hours -Currently still awaiting her lumbar spine aspiration and biopsy  -Neurosurgery consulted on the patient at [facility] and Dr Kathyrn Sheriff felt that because she remains neurologically intact, he recommends medical management because there is no frank abscess that could be addressed surgically and recommends an IR biopsy and follow-up with Dr. Patrice Paradise in the outpatient setting.   -IV fentanyl ordered as needed for pain.  Follow CBC and sed rate and CRP. -Resumed her home dosing of oxycodone as well at 10 mg -Follow-up biopsy results and now started on antibiotics next-continue IV vancomycin and IV ceftriaxone per ID recommendations and opiate orders have been placed the finalization's on Sunday likely when IV Vancomycin trough level can be obtained -ESR was 20 an repeat was 20  -Orthopantogram showed "No appreciable dental caries or periapical abscesses or other significant abnormalities." -ID checking TTE and showed " Left ventricular ejection fraction, by visual estimation, is 55 to 60%. The left ventricle has normal function. There is no left ventricular hypertrophy. Left ventricular diastolic  parameters were normal." -There is no mention of endocarditis on TTE -Opiate orders are in place -Continue monitor and anticipate discharge in next 24 to 48 hours PICC line can be placed and Vancomycin trough level can be obtained  Essential Hypertension -Currently uncontrolled -Her oral blood  pressure medications were temporarily held but now Felodipine has been resumed  -Pressure this morning was 192/85 and repeat later this AM was 163/66 -IV labetalol ordered as needed for elevated BP readings.  Chronic Low Back Pain with Radiculopathy/Lumbar DDD -Patient has radiological findings of degenerative disc disease of the lumbar spine associated with central canal stenosis and bilateral foraminal narrowing and chronic back pain has persisted for the last 2 to 3 years.   -She has IV fentanyl ordered for pain with 25 mcg every 2 hours as needed for moderate pain.  Also resumed her oxycodone IR 10 mg every 3 hours as needed for breakthrough pain   -Also resumed her home baclofen 20 mg p.o. 3 times daily -Her pain is well-managed at this time with ambulation.  She has no acute focal neurological deficits or red flag symptoms.  Depression and Anxiety/Fibromyalgia -Continue with Fluvoxamine 100 g p.o. nightly  History of Migraine Headaches -Chronic and stable -Resumed home phenobarbital 64.8 mg p.o. daily as needed for onset of migraine  Normocytic Anemia -Patient's Hgb/Hct went from 12.1/39.7 -> 11.5/37.6 -> 11.3/35.6 -> 11.2/35.9 -Check Anemia Panel in the AM  -Continue to Monitor for S/Sx of Bleeding; Currently no overt bleeding noted -Repeat CBC in AM   Hyperglycemia in the setting of a Hx of Pre-Diabetes -Patient's Blood sugars have been elevated since admission on Daily BMP/CMP -Ranging from 101-109 -Check HbA1c; Last HbA1c was 5.8 and was years ago  -Continue to Monitor Blood Sugars Carefully and if necessary will place on Sensitive Novolog SSI AC  Hypokalemia -Patient's K+ this AM was 3.2 -Replete with po KCl 40 mEQ BID x2 -Continue to Monitor and Replete as Necessary -Repeat CMP in AM   Obesity -Estimated body mass index is 37.41 kg/m as calculated from the following:   Height as of this encounter: _0  (1.6 m).   Weight as of this encounter: 95.8 kg. -Weight Loss  and Dietary Counseling given -Will consult Nutrition for further evaluation and Recc's  DVT prophylaxis: SCDs Code Status: FULL CODE  Family Communication: No family present at bedside  Disposition Plan: Pending infectious disease clearance and bank mycin trough level as well as PICC line placement  Consultants:   Interventional Radiology  Neurosurgery  Infectious Diseases    Procedures: IR Biopsy  ECHOCARDIOGRAM IMPRESSIONS    1. Left ventricular ejection fraction, by visual estimation, is 55 to 60%. The left ventricle has normal function. There is no left ventricular hypertrophy.  2. Global right ventricle has normal systolic function.The right ventricular size is normal. No increase in right ventricular wall thickness.  3. Left atrial size was normal.  4. Right atrial size was normal.  5. The mitral valve is normal in structure. No evidence of mitral valve regurgitation.  6. The tricuspid valve is normal in structure. Tricuspid valve regurgitation is trivial.  7. The aortic valve is normal in structure. Aortic valve regurgitation is mild.  8. The pulmonic valve was normal in structure. Pulmonic valve regurgitation is not visualized.  9. Moderately elevated pulmonary artery systolic pressure. 10. The atrial septum is grossly normal.  FINDINGS  Left Ventricle: Left ventricular ejection fraction, by visual estimation, is 55 to 60%. The left ventricle has  normal function. There is no left ventricular hypertrophy. Left ventricular diastolic parameters were normal.  Right Ventricle: The right ventricular size is normal. No increase in right ventricular wall thickness. Global RV systolic function is has normal systolic function. The tricuspid regurgitant velocity is 3.12 m/s, and with an assumed right atrial pressure  of 8 mmHg, the estimated right ventricular systolic pressure is moderately elevated at 46.9 mmHg.  Left Atrium: Left atrial size was normal in size.  Right  Atrium: Right atrial size was normal in size  Pericardium: There is no evidence of pericardial effusion.  Mitral Valve: The mitral valve is normal in structure. No evidence of mitral valve regurgitation.  Tricuspid Valve: The tricuspid valve is normal in structure. Tricuspid valve regurgitation is trivial.  Aortic Valve: The aortic valve is normal in structure. Aortic valve regurgitation is mild. Aortic regurgitation PHT measures 493 msec.  Pulmonic Valve: The pulmonic valve was normal in structure. Pulmonic valve regurgitation is not visualized.  Aorta: The aortic root and ascending aorta are structurally normal, with no evidence of dilitation.  IAS/Shunts: The atrial septum is grossly normal.    LEFT VENTRICLE PLAX 2D LVIDd:         4.73 cm  Diastology LVIDs:         3.34 cm  LV e' lateral:   11.90 cm/s LV PW:         0.98 cm  LV E/e' lateral: 9.8 LV IVS:        1.02 cm  LV e' medial:    8.70 cm/s LVOT diam:     1.70 cm  LV E/e' medial:  13.4 LV SV:         58 ml LV SV Index:   27.70 LVOT Area:     2.27 cm    RIGHT VENTRICLE RV Basal diam:  2.83 cm RV S prime:     16.90 cm/s TAPSE (M-mode): 3.4 cm  LEFT ATRIUM             Index       RIGHT ATRIUM           Index LA diam:        4.50 cm 2.27 cm/m  RA Area:     13.00 cm LA Vol (A2C):   57.7 ml 29.16 ml/m RA Volume:   30.00 ml  15.16 ml/m LA Vol (A4C):   44.6 ml 22.54 ml/m LA Biplane Vol: 52.0 ml 26.28 ml/m  AORTIC VALVE LVOT Vmax:   161.00 cm/s LVOT Vmean:  98.500 cm/s LVOT VTI:    0.374 m AI PHT:      493 msec   AORTA Ao Root diam: 2.30 cm  MITRAL VALVE                         TRICUSPID VALVE MV Area (PHT): 3.60 cm              TR Peak grad:   38.9 mmHg MV PHT:        61.19 msec            TR Vmax:        312.00 cm/s MV Decel Time: 211 msec MR Peak grad: 108.6 mmHg             SHUNTS MR Vmax:      521.00 cm/s            Systemic VTI:  0.37 m MV E velocity: 117.00 cm/s  103 cm/s  Systemic Diam:  1.70 cm MV A velocity: 129.00 cm/s 70.3 cm/s MV E/A ratio:  0.91        1.5  Antimicrobials: Anti-infectives (From admission, onward)   Start     Dose/Rate Route Frequency Ordered Stop   06/23/19 0700  vancomycin (VANCOCIN) 1,250 mg in sodium chloride 0.9 % 250 mL IVPB     1,250 mg 166.7 mL/hr over 90 Minutes Intravenous Every 12 hours 06/22/19 1637     06/22/19 1900  vancomycin (VANCOCIN) 1,500 mg in sodium chloride 0.9 % 500 mL IVPB     1,500 mg 250 mL/hr over 120 Minutes Intravenous  Once 06/22/19 1637 06/23/19 0021   06/22/19 1800  cefTRIAXone (ROCEPHIN) 2 g in sodium chloride 0.9 % 100 mL IVPB     2 g 200 mL/hr over 30 Minutes Intravenous Every 24 hours 06/22/19 1631     06/20/19 1600  vancomycin (VANCOCIN) IVPB 1000 mg/200 mL premix  Status:  Discontinued     1,000 mg 200 mL/hr over 60 Minutes Intravenous Every 1 hr x 2 06/20/19 1522 06/20/19 1526   06/20/19 1515  cefTRIAXone (ROCEPHIN) 2 g in sodium chloride 0.9 % 100 mL IVPB  Status:  Discontinued     2 g 200 mL/hr over 30 Minutes Intravenous  Once 06/20/19 1514 06/20/19 1526     Subjective: Seen and examined at bedside and states that her back pain is fairly well controlled.  Slept okay.  No nausea or vomiting.  Denies any lightheadedness or dizziness.  No other concerns or complaints this time and hopeful to be discharged the next few days.  Objective: Vitals:   06/23/19 0348 06/23/19 0500 06/23/19 0754 06/23/19 1140  BP: (!) 163/75  (!) 192/85 (!) 163/66  Pulse: 72  85 82  Resp: _0 Temp: 98.2 F (36.8 C)  98.1 F (36.7 C) 98.4 F (36.9 C)  TempSrc: Oral  Oral Oral  SpO2: 98%  98% 96%  Weight:  95.8 kg    Height:        Intake/Output Summary (Last 24 hours) at 06/23/2019 1316 Last data filed at 06/23/2019 0802 Gross per 24 hour  Intake 0 ml  Output --  Net 0 ml   Filed Weights   06/20/19 1347 06/20/19 1533 06/23/19 0500  Weight: 88.5 kg 88.5 kg 95.8 kg   Examination: Physical  Exam:  Constitutional: WN/WD obese Caucasian female currently no acute distress appears calm and comfortable  Eyes: Lids and conjunctivae normal, sclerae anicteric  ENMT: External Ears, Nose appear normal. Grossly normal hearing. Mucous membranes are moist.  Neck: Appears normal, supple, no cervical masses, normal ROM, no appreciable thyromegaly; no JVD Respiratory: Diminished to auscultation bilaterally, no wheezing, rales, rhonchi or crackles. Normal respiratory effort and patient is not tachypenic. No accessory muscle use.  Cardiovascular: RRR, no murmurs / rubs / gallops. S1 and S2 auscultated.  Nonpitting extremity edema Abdomen: Soft, non-tender, distended secondary body habitus.  Bowel sounds present x4.  GU: Deferred. Musculoskeletal: No clubbing / cyanosis of digits/nails. No joint deformity upper and lower extremities.  Skin: No rashes, lesions, ulcers on a limited skin evaluation. No induration; Warm and dry.  Neurologic: CN 2-12 grossly intact with no focal deficits. Romberg sign and cerebellar reflexes not assessed.  Psychiatric: Normal judgment and insight. Alert and oriented x 3. Normal mood and appropriate affect.   Data Reviewed: I have personally reviewed following labs and imaging studies  CBC: Recent Labs  Lab  06/20/19 1551 06/21/19 0525 06/22/19 0406 06/23/19 0235  WBC 6.3 6.1 6.6 8.1  NEUTROABS 3.6  --  2.2 3.9  HGB 12.1 11.5* 11.3* 11.2*  HCT 39.7 37.6 35.6* 35.9*  MCV 91.1 91.0 87.7 89.5  PLT 209 210 204 032   Basic Metabolic Panel: Recent Labs  Lab 06/19/19 1627 06/20/19 1551 06/21/19 0525 06/22/19 0406 06/23/19 0235  NA  --  140 140 142 140  K  --  4.2 3.9 3.4* 3.2*  CL  --  105 103 102 104  CO2  --  _0 GLUCOSE  --  109* 109* 101* 88  BUN  --  _1 CREATININE 0.70 0.49 0.47 0.52 0.45  CALCIUM  --  9.3 9.0 9.1 8.9  MG  --   --  2.1 2.1 2.0  PHOS  --   --   --  4.2 4.2   GFR: Estimated Creatinine Clearance: 76.2 mL/min (by  C-G formula based on SCr of 0.45 mg/dL). Liver Function Tests: Recent Labs  Lab 06/20/19 1551 06/22/19 0406 06/23/19 0235  AST _2 ALT _3 ALKPHOS 71 62 53  BILITOT 0.3 0.7 0.7  PROT 8.0 6.9 6.5  ALBUMIN 4.5 3.8 3.6   No results for input(s): LIPASE, AMYLASE in the last 168 hours. No results for input(s): AMMONIA in the last 168 hours. Coagulation Profile: Recent Labs  Lab 06/20/19 1551  INR 1.0   Cardiac Enzymes: No results for input(s): CKTOTAL, CKMB, CKMBINDEX, TROPONINI in the last 168 hours. BNP (last 3 results) No results for input(s): PROBNP in the last 8760 hours. HbA1C: No results for input(s): HGBA1C in the last 72 hours. CBG: No results for input(s): GLUCAP in the last 168 hours. Lipid Profile: No results for input(s): CHOL, HDL, LDLCALC, TRIG, CHOLHDL, LDLDIRECT in the last 72 hours. Thyroid Function Tests: No results for input(s): TSH, T4TOTAL, FREET4, T3FREE, THYROIDAB in the last 72 hours. Anemia Panel: No results for input(s): VITAMINB12, FOLATE, FERRITIN, TIBC, IRON, RETICCTPCT in the last 72 hours. Sepsis Labs: Recent Labs  Lab 06/20/19 1550  LATICACIDVEN 1.0    Recent Results (from the past 240 hour(s))  Blood Culture (routine x 2)     Status: None (Preliminary result)   Collection Time: 06/20/19  3:40 PM   Specimen: BLOOD LEFT FOREARM  Result Value Ref Range Status   Specimen Description BLOOD LEFT FOREARM  Final   Special Requests   Final    BOTTLES DRAWN AEROBIC AND ANAEROBIC Blood Culture adequate volume   Culture   Final    NO GROWTH 3 DAYS Performed at [facility], [address], [place], [place] [address]    Report Status PENDING  Incomplete  Blood Culture (routine x 2)     Status: None (Preliminary result)   Collection Time: 06/20/19  3:51 PM   Specimen: BLOOD RIGHT ARM  Result Value Ref Range Status   Specimen Description BLOOD RIGHT ARM  Final   Special Requests   Final    BOTTLES DRAWN AEROBIC AND ANAEROBIC Blood  Culture adequate volume   Culture   Final    NO GROWTH 3 DAYS Performed at [facility], [address] [place], [place] [address]    Report Status PENDING  Incomplete  SARS CORONAVIRUS 2 (TAT 6-24 HRS) Nasopharyngeal Nasopharyngeal Swab     Status: None   Collection Time: 06/20/19  5:40 PM   Specimen: Nasopharyngeal Swab  Result Value  Ref Range Status   SARS Coronavirus 2 NEGATIVE NEGATIVE Final    Comment: (NOTE) SARS-CoV-2 target nucleic acids are NOT DETECTED. The SARS-CoV-2 RNA is generally detectable in upper and lower respiratory specimens during the acute phase of infection. Negative results do not preclude SARS-CoV-2 infection, do not rule out co-infections with other pathogens, and should not be used as the sole basis for treatment or other patient management decisions. Negative results must be combined with clinical observations, patient history, and epidemiological information. The expected result is Negative. Fact Sheet for Patients: SugarRoll.be Fact Sheet for Healthcare Providers: https://www.woods-mathews.com/ This test is not yet approved or cleared by the [place] FDA and  has been authorized for detection and/or diagnosis of SARS-CoV-2 by FDA under an Emergency Use Authorization (EUA). This EUA will remain  in effect (meaning this test can be used) for the duration of the COVID-19 declaration under Section 56 4(b)(1) of the Act, 21 U.S.C. section 360bbb-3(b)(1), unless the authorization is terminated or revoked sooner. Performed at [facility], [place] [address], [place], [place] [address]   Culture, blood (routine x 2)     Status: None (Preliminary result)   Collection Time: 06/22/19  7:42 AM   Specimen: BLOOD RIGHT FOREARM  Result Value Ref Range Status   Specimen Description BLOOD RIGHT FOREARM  Final   Special Requests   Final    BOTTLES DRAWN AEROBIC ONLY Blood Culture results may not be optimal due to  an inadequate volume of blood received in culture bottles   Culture   Final    NO GROWTH < 24 HOURS Performed at [facility], [place] [address], [place], [place] [address]    Report Status PENDING  Incomplete  Culture, blood (routine x 2)     Status: None (Preliminary result)   Collection Time: 06/22/19  7:46 AM   Specimen: BLOOD LEFT FOREARM  Result Value Ref Range Status   Specimen Description BLOOD LEFT FOREARM  Final   Special Requests   Final    BOTTLES DRAWN AEROBIC ONLY Blood Culture adequate volume   Culture   Final    NO GROWTH < 24 HOURS Performed at [facility], [place] [address], [place], [place] [address]    Report Status PENDING  Incomplete  Body fluid culture     Status: None (Preliminary result)   Collection Time: 06/22/19  4:01 PM   Specimen: PATH Disc; Body Fluid  Result Value Ref Range Status   Specimen Description FLUID  Final   Special Requests DISC SPACE ASPIRATION  Final   Gram Stain   Final    FEW WBC PRESENT,BOTH PMN AND MONONUCLEAR NO ORGANISMS SEEN    Culture   Final    NO GROWTH < 24 HOURS Performed at [facility], [place] [address], [place], [place] [address]    Report Status PENDING  Incomplete    Radiology Studies: Dg Orthopantogram  Result Date: 06/23/2019 CLINICAL DATA:  Discitis, osteomyelitis and epidural infection. EXAM: ORTHOPANTOGRAM/PANORAMIC COMPARISON:  None. FINDINGS: There are no appreciable dental caries or periapical abscesses. Impacted right mandibular third molar. The maxillary sinuses are clear. IMPRESSION: No appreciable dental caries or periapical abscesses or other significant abnormalities. Electronically Signed   By: Lorriane Shire M.D.   On: 06/23/2019 08:43   [place] Ekg Site Rite  Result Date: 06/22/2019 If Site Rite image not attached, placement could not be confirmed due to current cardiac rhythm.   Scheduled Meds:  aspirin EC  81 mg Oral Daily   baclofen  20 mg Oral TID   felodipine  2.5 mg Oral Daily     fluvoxaMINE  100 mg Oral QHS   oxymetazoline  1 spray Each Nare BID   potassium chloride  40 mEq Oral BID   pramipexole  0.25 mg Oral Daily   sodium chloride flush  3 mL Intravenous Q12H   Continuous Infusions:  cefTRIAXone (ROCEPHIN)  IV 2 g (06/22/19 1729)   vancomycin 1,250 mg (06/23/19 1025)    LOS: 3 days   Kerney Elbe, DO [facility] PAGER is on Jessie  If 7PM-7AM, please contact night-coverage [email or web address]

## 2019-06-23 NOTE — Progress Notes (Signed)
[facility]  Date of Admission:  06/20/2019      Total days of antibiotics 2  Vancomycin + Ceftriaxone           ASSESSMENT: Shawna Lewis is a 68 y.o. 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 Micro data from aspiration was with only few WBCs and no organisms on gram stain and no growth @ 24 hours. All 4 sets of blood cultures are thusfar negative as well 72 and 48 hours respectively.   In consideration for working up vertebrtal infection for a source her oro pantogram was without any abnormal findings and her transthoracic echocardiogram is pending.   OK to place PICC line now with anticipated discharge Saturday if [place] can check a vanc level Sunday. She had a dose interruption today with infiltrated IV unfortunately.    PLAN: 1. Follow TTE results  2. Follow micro results  3. Continue vanc + ceftriaxone for now 4. PICC placement ok today  5. Hopeful for D/C Saturday    OPAT ORDERS:  Diagnosis: L2-L3 Discitis, Osteomyelitis Epidural Phlegmon   Culture Result: negative (pending from 06/22/19)  Allergies  Allergen Reactions  . Latex     Discharge antibiotics: Per pharmacy protocol Vancomycin trough 15-20 (unless otherwise indicated)  +  Ceftriaxone 2 gm IV Q24h    Duration: 6 weeks   End Date: January 14th   [facility] Care and Maintenance Per Protocol _x_ Please pull PIC at completion of IV antibiotics __ Please leave PIC in place until doctor has seen patient or been notified  Labs weekly while on IV antibiotics: _x_ CBC with differential _x_ BMP TWICE WEEKLY** _x_ CRP Q 2 weeks  _x_ ESR Q 2 weeks  _x_ Vancomycin trough  Fax weekly labs to [phone]  Clinic Follow Up Appt: MyChart Video Visit Friday 12/11 @ 9:45 a.m. with Janene Madeira, NP   Principal Problem:   Diskitis Active Problems:    Osteomyelitis ([place])   HTN (hypertension)   DDD (degenerative disc disease), lumbar   Migraine   Chronic low back pain   Central stenosis of spinal canal   Chronic left lumbar radiculopathy   . aspirin EC  81 mg Oral Daily  . baclofen  20 mg Oral TID  . felodipine  2.5 mg Oral Daily  . fluvoxaMINE  100 mg Oral QHS  . oxymetazoline  1 spray Each Nare BID  . potassium chloride  40 mEq Oral BID  . pramipexole  0.25 mg Oral Daily  . sodium chloride flush  3 mL Intravenous Q12H    SUBJECTIVE: Feeling fine. Ready to go home. Wants to talk about PICC line and what to expect at home.    Review of Systems: Review of Systems  Constitutional: Negative for chills, fever, malaise/fatigue and weight loss.  HENT: Negative for sore throat.        No dental problems  Respiratory: Negative for cough and sputum production.   Cardiovascular: Negative for chest pain and leg swelling.  Gastrointestinal: Negative for abdominal pain, diarrhea and vomiting.  Genitourinary: Negative for dysuria and flank pain.  Musculoskeletal: Positive for back pain (chronic and unchanged). Negative for joint pain, myalgias and neck pain.  Skin: Negative for rash.  Neurological: Negative for dizziness, tingling and headaches.  Psychiatric/Behavioral: Negative for depression and substance abuse. The patient is not nervous/anxious and  does not have insomnia.     Allergies  Allergen Reactions  . Latex     OBJECTIVE: Vitals:   06/22/19 2329 06/23/19 0348 06/23/19 0500 06/23/19 0754  BP: (!) 151/73 (!) 163/75  (!) 192/85  Pulse: 61 72  85  Resp: 17 17  17   Temp: 98.3 F (36.8 C) 98.2 F (36.8 C)  98.1 F (36.7 C)  TempSrc: Oral Oral  Oral  SpO2: 96% 98%  98%  Weight:   95.8 kg   Height:       Body mass index is 37.41 kg/m.  Physical Exam Constitutional:      Appearance: She is well-developed.     Comments: Seated comfortably in bed  HENT:     Mouth/Throat:     Mouth: No oral lesions.      Dentition: Normal dentition. No dental abscesses.     Pharynx: No oropharyngeal exudate.  Cardiovascular:     Rate and Rhythm: Normal rate and regular rhythm.     Heart sounds: Normal heart sounds.  Pulmonary:     Effort: Pulmonary effort is normal.     Breath sounds: Normal breath sounds.  Abdominal:     General: There is no distension.     Palpations: Abdomen is soft.     Tenderness: There is no abdominal tenderness.  Lymphadenopathy:     Cervical: No cervical adenopathy.  Skin:    General: Skin is warm and dry.     Findings: No rash.  Neurological:     Mental Status: She is alert and oriented to person, place, and time.  Psychiatric:        Judgment: Judgment normal.     Comments: In good spirits today     Lab Results Lab Results  Component Value Date   WBC 8.1 06/23/2019   HGB 11.2 (L) 06/23/2019   HCT 35.9 (L) 06/23/2019   MCV 89.5 06/23/2019   PLT 195 06/23/2019    Lab Results  Component Value Date   CREATININE 0.45 06/23/2019   BUN 11 06/23/2019   NA 140 06/23/2019   K 3.2 (L) 06/23/2019   CL 104 06/23/2019   CO2 24 06/23/2019    Lab Results  Component Value Date   ALT 24 06/23/2019   AST 25 06/23/2019   ALKPHOS 53 06/23/2019   BILITOT 0.7 06/23/2019     Microbiology: Recent Results (from the past 240 hour(s))  Blood Culture (routine x 2)     Status: None (Preliminary result)   Collection Time: 06/20/19  3:40 PM   Specimen: BLOOD LEFT FOREARM  Result Value Ref Range Status   Specimen Description BLOOD LEFT FOREARM  Final   Special Requests   Final    BOTTLES DRAWN AEROBIC AND ANAEROBIC Blood Culture adequate volume   Culture   Final    NO GROWTH 3 DAYS Performed at [facility], [address], [place], [place] [address]    Report Status PENDING  Incomplete  Blood Culture (routine x 2)     Status: None (Preliminary result)   Collection Time: 06/20/19  3:51 PM   Specimen: BLOOD RIGHT ARM  Result Value Ref Range Status   Specimen Description  BLOOD RIGHT ARM  Final   Special Requests   Final    BOTTLES DRAWN AEROBIC AND ANAEROBIC Blood Culture adequate volume   Culture   Final    NO GROWTH 3 DAYS Performed at [facility], [address], [place], [place] [address]    Report  Status PENDING  Incomplete  SARS CORONAVIRUS 2 (TAT 6-24 HRS) Nasopharyngeal Nasopharyngeal Swab     Status: None   Collection Time: 06/20/19  5:40 PM   Specimen: Nasopharyngeal Swab  Result Value Ref Range Status   SARS Coronavirus 2 NEGATIVE NEGATIVE Final    Comment: (NOTE) SARS-CoV-2 target nucleic acids are NOT DETECTED. The SARS-CoV-2 RNA is generally detectable in upper and lower respiratory specimens during the acute phase of infection. Negative results do not preclude SARS-CoV-2 infection, do not rule out co-infections with other pathogens, and should not be used as the sole basis for treatment or other patient management decisions. Negative results must be combined with clinical observations, patient history, and epidemiological information. The expected result is Negative. Fact Sheet for Patients: SugarRoll.be Fact Sheet for Healthcare Providers: https://www.woods-mathews.com/ This test is not yet approved or cleared by the [place] FDA and  has been authorized for detection and/or diagnosis of SARS-CoV-2 by FDA under an Emergency Use Authorization (EUA). This EUA will remain  in effect (meaning this test can be used) for the duration of the COVID-19 declaration under Section 56 4(b)(1) of the Act, 21 U.S.C. section 360bbb-3(b)(1), unless the authorization is terminated or revoked sooner. Performed at [facility], [place] [address], [place], [place] [address]   Culture, blood (routine x 2)     Status: None (Preliminary result)   Collection Time: 06/22/19  7:42 AM   Specimen: BLOOD RIGHT FOREARM  Result Value Ref Range Status   Specimen Description BLOOD RIGHT FOREARM  Final   Special  Requests   Final    BOTTLES DRAWN AEROBIC ONLY Blood Culture results may not be optimal due to an inadequate volume of blood received in culture bottles   Culture   Final    NO GROWTH < 24 HOURS Performed at [facility], [place] [address], [place], [place] [address]    Report Status PENDING  Incomplete  Culture, blood (routine x 2)     Status: None (Preliminary result)   Collection Time: 06/22/19  7:46 AM   Specimen: BLOOD LEFT FOREARM  Result Value Ref Range Status   Specimen Description BLOOD LEFT FOREARM  Final   Special Requests   Final    BOTTLES DRAWN AEROBIC ONLY Blood Culture adequate volume   Culture   Final    NO GROWTH < 24 HOURS Performed at [facility], [place] [address] [place], [place] [address]    Report Status PENDING  Incomplete  Body fluid culture     Status: None (Preliminary result)   Collection Time: 06/22/19  4:01 PM   Specimen: PATH Disc; Body Fluid  Result Value Ref Range Status   Specimen Description FLUID  Final   Special Requests DISC SPACE ASPIRATION  Final   Gram Stain   Final    FEW WBC PRESENT,BOTH PMN AND MONONUCLEAR NO ORGANISMS SEEN    Culture   Final    NO GROWTH < 24 HOURS Performed at [facility], [place] [address], [place], [place] [address]    Report Status PENDING  Incomplete     Janene Madeira, MSN, NP-C [facility] [email or web address] Pager: [phone] Office: [place]: [phone]

## 2019-06-24 LAB — CBC WITH DIFFERENTIAL/PLATELET
Abs Immature Granulocytes: 0.02 10*3/uL (ref 0.00–0.07)
Basophils Absolute: 0.1 10*3/uL (ref 0.0–0.1)
Basophils Relative: 1 %
Eosinophils Absolute: 0.3 10*3/uL (ref 0.0–0.5)
Eosinophils Relative: 3 %
HCT: 35.2 % — ABNORMAL LOW (ref 36.0–46.0)
Hemoglobin: 11.3 g/dL — ABNORMAL LOW (ref 12.0–15.0)
Immature Granulocytes: 0 %
Lymphocytes Relative: 26 %
Lymphs Abs: 2 10*3/uL (ref 0.7–4.0)
MCH: 28.5 pg (ref 26.0–34.0)
MCHC: 32.1 g/dL (ref 30.0–36.0)
MCV: 88.7 fL (ref 80.0–100.0)
Monocytes Absolute: 0.6 10*3/uL (ref 0.1–1.0)
Monocytes Relative: 8 %
Neutro Abs: 4.7 10*3/uL (ref 1.7–7.7)
Neutrophils Relative %: 62 %
Platelets: 199 10*3/uL (ref 150–400)
RBC: 3.97 MIL/uL (ref 3.87–5.11)
RDW: 14 % (ref 11.5–15.5)
WBC: 7.7 10*3/uL (ref 4.0–10.5)
nRBC: 0 % (ref 0.0–0.2)

## 2019-06-24 LAB — COMPREHENSIVE METABOLIC PANEL
ALT: 27 U/L (ref 0–44)
AST: 25 U/L (ref 15–41)
Albumin: 3.8 g/dL (ref 3.5–5.0)
Alkaline Phosphatase: 56 U/L (ref 38–126)
Anion gap: 12 (ref 5–15)
BUN: 8 mg/dL (ref 8–23)
CO2: 24 mmol/L (ref 22–32)
Calcium: 9.1 mg/dL (ref 8.9–10.3)
Chloride: 105 mmol/L (ref 98–111)
Creatinine, Ser: 0.54 mg/dL (ref 0.44–1.00)
GFR calc Af Amer: >60 mL/min (ref >60)
GFR calc non Af Amer: >60 mL/min (ref >60)
Glucose, Bld: 101 mg/dL — ABNORMAL HIGH (ref 70–99)
Potassium: 3.9 mmol/L (ref 3.5–5.1)
Sodium: 141 mmol/L (ref 135–145)
Total Bilirubin: 1 mg/dL (ref 0.3–1.2)
Total Protein: 6.9 g/dL (ref 6.5–8.1)

## 2019-06-24 LAB — FERRITIN: Ferritin: 76 ng/mL (ref 11–307)

## 2019-06-24 LAB — SEDIMENTATION RATE: Sed Rate: 23 mm/hr — ABNORMAL HIGH (ref 0–22)

## 2019-06-24 LAB — IRON AND TIBC
Iron: 53 ug/dL (ref 28–170)
Saturation Ratios: 16 % (ref 10.4–31.8)
TIBC: 335 ug/dL (ref 250–450)
UIBC: 282 ug/dL

## 2019-06-24 LAB — RETICULOCYTES
Immature Retic Fract: 14.7 % (ref 2.3–15.9)
RBC.: 3.97 MIL/uL (ref 3.87–5.11)
Retic Count, Absolute: 44.9 10*3/uL (ref 19.0–186.0)
Retic Ct Pct: 1.1 % (ref 0.4–3.1)

## 2019-06-24 LAB — HEMOGLOBIN A1C
Hgb A1c MFr Bld: 6.1 % — ABNORMAL HIGH (ref 4.8–5.6)
Mean Plasma Glucose: 128.37 mg/dL

## 2019-06-24 LAB — PHOSPHORUS: Phosphorus: 3.6 mg/dL (ref 2.5–4.6)

## 2019-06-24 LAB — MAGNESIUM: Magnesium: 2 mg/dL (ref 1.7–2.4)

## 2019-06-24 LAB — VITAMIN B12: Vitamin B-12: 605 pg/mL (ref 180–914)

## 2019-06-24 LAB — FOLATE: Folate: 33.9 ng/mL (ref >5.9)

## 2019-06-24 MED ORDER — SODIUM CHLORIDE 0.9% FLUSH
10.0000 mL | Freq: Two times a day (BID) | INTRAVENOUS | Status: DC
  Administered 2019-06-24 – 2019-06-25 (×2): 10 mL

## 2019-06-24 MED ORDER — SODIUM CHLORIDE 0.9 % IV SOLN
INTRAVENOUS | Status: DC | PRN
  Administered 2019-06-24: 500 mL via INTRAVENOUS

## 2019-06-24 MED ORDER — SODIUM CHLORIDE 0.9% FLUSH: 10.0000 mL | INTRAVENOUS | Status: DC | PRN

## 2019-06-24 MED ORDER — CHLORHEXIDINE GLUCONATE CLOTH 2 % EX PADS
6.0000 | MEDICATED_PAD | Freq: Every day | CUTANEOUS | Status: DC
  Administered 2019-06-24 – 2019-06-25 (×2): 6 via TOPICAL

## 2019-06-24 NOTE — Progress Notes (Signed)
PROGRESS NOTE    SYLVA Lewis  HYQ:[id number] DOB: [birth year] DOA: 06/20/2019 PCP: Sinda Du, MD   Brief Narrative:  HPI per Dr. Babs Bertin on 06/20/2019 CIAIRA Lewis is a 68 y.o. female with medical history significant for chronic low back pain, degenerative disc disease of the lumbar spine, hypertension, who is admitted to [facility] on 06/20/2019 with osteomyelitis/discitis of L2-L3 after presenting from home to [facility] emergency department at the direction of her neurosurgeon following result of abnormal outpatient MRI of the lumbar spine.   The patient reports a history of chronic low back pain over the last 2 to 3 years in the setting of degenerative disc disease involving the lumbar spine as well as central canal stenosis.  In this context, the patient reports that she follows with Dr. Jaynee Eagles of neurology as well as Dr. Patrice Paradise, both of which are associated with [facility] medical.  In the setting of chronic, nonescalating, low back pain, the patient reports that she underwent routine follow-up MRI of the lumbar spine as an outpatient earlier today (06/20/19), as ordered by her outpatient neurosurgeon, Dr. Patrice Paradise.  This MRI, which was performed with and without contrast demonstrated osteomyelitis/discitis at L2-L3, epidural phlegmon, moderate central canal stenosis, and bilateral foraminal narrowing.  The patient reports that she was called with these results by Dr. Patrice Paradise, who recommended presentation to the emergency department for further evaluation.   The patient reports no worsening of the intensity of her low back pain over the course of the last year.  She reports chronic numbness over the dorsal and plantar aspects of the bilateral feet, which is unchanged over the last 2 to 3 years.  Denies any associated saddle anesthesia.  She also denies any recent weakness involving the lower extremities.  Additionally, she denies any recent fecal incontinence or urinary  retention.  Denies any trauma over the last 1 year.  She denies any recent subjective fever, chills, rigors, or generalized myalgias.  She also denies any headache, neck stiffness, sore throat, cough, nausea, vomiting, abdominal pain, diarrhea, or dysuria.  Denies any recent traveling or known positive COVID-19 exposures.  She denies any recent CNS procedures, and denies any history of intravenous drug use.  Additionally, the patient reports that she is on no blood thinning agents at home, including no aspirin, in spite of her home medication list including this anti-platelet med.    ED Course: Vital signs in the emergency department were notable for the following: Temperature max 99.5; heart rate 70-77; blood pressure 197/84; respiratory rate 15-18, and oxygen saturation 97 to 99% on room air.  Labs in the ED were notable for the following: CMP notable for bicarbonate 26, creatinine 0.49, glucose 109.  CBC notable for white blood cell count of 6300 with 58% neutrophils.  Lactic acid 1.0.  CRP 0.8.  ESR 22.  Urinalysis showed no white blood cells, and was leukocyte esterase/nitrite negative.  Nasopharyngeal COVID-19 swab was performed in the ED today, with result currently pending.  Blood cultures x2 are also collected in the ED today prior to the initiation of any antibiotics.   The patient's case and imaging were discussed with the APP for Dr. Kathyrn Sheriff of neurosurgery, who recommended admission to the hospital service with consultation of infectious disease as well as interventional radiology, with plan for neurosurgery to formally consult on the patient in the morning.  The patient's case was also discussed with the on-call infectious disease physician, Dr. Baxter Flattery, who recommended  lumbar aspiration and cultures to be performed by interventional radiology. Dr. Baxter Flattery recommended that antibiotics be held until these cultures are obtained, following which he recommended the initiation of vancomycin as  well as cefepime.  Subsequently, case was discussed with Dr. Laurence Ferrari of interventional radiology, who recommended IR-guided lumbar aspiration at [facility] on the morning of 06/21/2019. Dr. Laurence Ferrari asked that the patient be n.p.o. after midnight and and that patient no receive any blood thinning agents leading up to this procedure.   Subsequently, the patient was admitted to [facility] for further evaluation and management of presenting osteomyelitis/discitis at L2-L3, including formal neurosurgery consultation, formal infectious disease consultation, and plan for IR-guided lumbar aspiration with associated cultures.  While in [facility] ED, no IV fluids or medications were administered  **Interim History Patient underwent IR guided biopsy and was started on IV Vancomycin and Ceftriaxone. Neurosurgery infectious diseases and IR all involved patient's care. ID checking TTE and recommend PICC Placement. Disc Space Aspiration showed No Organisms seen and NGTD <24 Hours so far. Unfortunately she had a dose interruption of her IV vancomycin due to infiltrated IV and I spoke with infectious disease Dr. Prince Rome who feels that we will need to adequately get vancomycin trough level prior to discharge and this will likely be done Sunday recommends holding her discharge until Sunday once Vanco levels returned and she is seen by Dr. Baxter Flattery.  Anticipating likely discharge home tomorrow with home health after her vancomycin trough levels have returned and she seen by Dr. Baxter Flattery of infectious disease for final antibiotic recommendations  Assessment & Plan:   Principal Problem:   Diskitis Active Problems:   Osteomyelitis ([place])   HTN (hypertension)   DDD (degenerative disc disease), lumbar   Migraine   Chronic low back pain   Central stenosis of spinal canal   Chronic left lumbar radiculopathy  Discitis/Osteomyelitis of L2/L3 with associated extensive endplate destructive changes. -After collaboration  with ID, neurosurgery decision was made to obtain IR guided lumbar aspiration prior to initiation of antibiotics.   -After the biopsy Dr. Baxter Flattery recommended initiation of vancomycin and cefepime but now ID recommending IV Vancomycin/IV Ceftriaxone following her lumbar spine aspiration and this has been started -ID recommending repeating Blood Cx x2 for the next 3 days and this has been ordered -Blood cultures on 06/20/2019 showed no growth to date at 4 days -Blood cultures from 06/22/2019 showed NGTD at 2 Days -Body fluid culture still pending and showed no organisms present showed no growth to date at 2 Days -Currently still awaiting her lumbar spine aspiration and biopsy  -Neurosurgery consulted on the patient at [facility] and Dr Kathyrn Sheriff felt that because she remains neurologically intact, he recommends medical management because there is no frank abscess that could be addressed surgically and recommends an IR biopsy and follow-up with Dr. Patrice Paradise in the outpatient setting.   -IV fentanyl ordered as needed for pain.  Follow CBC and sed rate and CRP. -Resumed her home dosing of oxycodone as well at 10 mg -Follow-up biopsy results and now started on antibiotics next -Continue IV vancomycin and IV ceftriaxone per ID recommendations and OPAT orders have been placed with final recommendations by ID on Sunday likely when IV Vancomycin trough level can be obtained -ESR was 20 an repeat was 20  -Orthopantogram showed "No appreciable dental caries or periapical abscesses or other significant abnormalities." -ID checking TTE and showed " Left ventricular ejection fraction, by visual estimation, is 55 to 60%. The left  ventricle has normal function. There is no left ventricular hypertrophy. Left ventricular diastolic parameters were normal." -There is no mention of endocarditis on TTE -Opiate orders are in place -Continue monitor and anticipate discharge in next 24hours  -PICC line placed this AM and  Vancomycin trough level to be obtained in AM   Essential Hypertension -Currently uncontrolled -Her oral blood pressure medications were temporarily held but now Felodipine has been resumed  -Pressure this morning was improved to 143/65 -IV labetalol ordered as needed for elevated BP readings.  Chronic Low Back Pain with Radiculopathy/Lumbar DDD -Patient has radiological findings of degenerative disc disease of the lumbar spine associated with central canal stenosis and bilateral foraminal narrowing and chronic back pain has persisted for the last 2 to 3 years.   -She has IV fentanyl ordered for pain with 25 mcg every 2 hours as needed for moderate pain. Also resumed her Oxycodone IR 10 mg every 3 hours as needed for breakthrough pain   -Also resumed her home baclofen 20 mg p.o. 3 times daily -Her pain is well-managed at this time with ambulation.  She has no acute focal neurological deficits or red flag symptoms.  Depression and Anxiety/Fibromyalgia -Continue with Fluvoxamine 100 g p.o. nightly  History of Migraine Headaches -Chronic and stable -Resumed home phenobarbital 64.8 mg p.o. daily as needed for onset of migraine  Normocytic Anemia -Patient's Hgb/Hct went from 12.1/39.7 -> 11.5/37.6 -> 11.3/35.6 -> 11.2/35.9 -> 11.3/35.2 -Checked Anemia Panel and showed an iron level of 53, U IBC of 282, TIBC of 235, saturation ratios of 60%, ferritin level 76, folate level 33.9, vitamin B12 level 605 -Continue to Monitor for S/Sx of Bleeding; Currently no overt bleeding noted -Repeat CBC in AM   Hyperglycemia in the setting of a Hx of Pre-Diabetes -Patient's Blood sugars have been elevated since admission on Daily BMP/CMP -Ranging from 101-109 -Checked  HbA1c and now is 6.1; Last HbA1c was 5.8 and was years ago  -Continue to Monitor Blood Sugars Carefully and if necessary will place on Sensitive Novolog SSI AC  Hypokalemia -Patient's K+ yesterday AM was 3.2 and now improved to  3.9 -Replete with po KCl 40 mEQ BID x2 -Continue to Monitor and Replete as Necessary -Repeat CMP in AM   Obesity -Estimated body mass index is 37.41 kg/m as calculated from the following:   Height as of this encounter: '5\' 3"'$  (1.6 m).   Weight as of this encounter: 95.8 kg. -Weight Loss and Dietary Counseling given -Will consult Nutrition for further evaluation and Recc's  DVT prophylaxis: SCDs Code Status: FULL CODE  Family Communication: No family present at bedside  Disposition Plan: Pending infectious disease clearance and Vancomycin trough level as well as PICC line placement; Anticipate D/C Home in the AM   Consultants:   Interventional Radiology  Neurosurgery  Infectious Diseases    Procedures: IR Biopsy  ECHOCARDIOGRAM IMPRESSIONS    1. Left ventricular ejection fraction, by visual estimation, is 55 to 60%. The left ventricle has normal function. There is no left ventricular hypertrophy.  2. Global right ventricle has normal systolic function.The right ventricular size is normal. No increase in right ventricular wall thickness.  3. Left atrial size was normal.  4. Right atrial size was normal.  5. The mitral valve is normal in structure. No evidence of mitral valve regurgitation.  6. The tricuspid valve is normal in structure. Tricuspid valve regurgitation is trivial.  7. The aortic valve is normal in structure. Aortic valve regurgitation is mild.  8. The pulmonic valve was normal in structure. Pulmonic valve regurgitation is not visualized.  9. Moderately elevated pulmonary artery systolic pressure. 10. The atrial septum is grossly normal.  FINDINGS  Left Ventricle: Left ventricular ejection fraction, by visual estimation, is 55 to 60%. The left ventricle has normal function. There is no left ventricular hypertrophy. Left ventricular diastolic parameters were normal.  Right Ventricle: The right ventricular size is normal. No increase in right ventricular  wall thickness. Global RV systolic function is has normal systolic function. The tricuspid regurgitant velocity is 3.12 m/s, and with an assumed right atrial pressure  of 8 mmHg, the estimated right ventricular systolic pressure is moderately elevated at 46.9 mmHg.  Left Atrium: Left atrial size was normal in size.  Right Atrium: Right atrial size was normal in size  Pericardium: There is no evidence of pericardial effusion.  Mitral Valve: The mitral valve is normal in structure. No evidence of mitral valve regurgitation.  Tricuspid Valve: The tricuspid valve is normal in structure. Tricuspid valve regurgitation is trivial.  Aortic Valve: The aortic valve is normal in structure. Aortic valve regurgitation is mild. Aortic regurgitation PHT measures 493 msec.  Pulmonic Valve: The pulmonic valve was normal in structure. Pulmonic valve regurgitation is not visualized.  Aorta: The aortic root and ascending aorta are structurally normal, with no evidence of dilitation.  IAS/Shunts: The atrial septum is grossly normal.    LEFT VENTRICLE PLAX 2D LVIDd:         4.73 cm  Diastology LVIDs:         3.34 cm  LV e' lateral:   11.90 cm/s LV PW:         0.98 cm  LV E/e' lateral: 9.8 LV IVS:        1.02 cm  LV e' medial:    8.70 cm/s LVOT diam:     1.70 cm  LV E/e' medial:  13.4 LV SV:         58 ml LV SV Index:   27.70 LVOT Area:     2.27 cm    RIGHT VENTRICLE RV Basal diam:  2.83 cm RV S prime:     16.90 cm/s TAPSE (M-mode): 3.4 cm  LEFT ATRIUM             Index       RIGHT ATRIUM           Index LA diam:        4.50 cm 2.27 cm/m  RA Area:     13.00 cm LA Vol (A2C):   57.7 ml 29.16 ml/m RA Volume:   30.00 ml  15.16 ml/m LA Vol (A4C):   44.6 ml 22.54 ml/m LA Biplane Vol: 52.0 ml 26.28 ml/m  AORTIC VALVE LVOT Vmax:   161.00 cm/s LVOT Vmean:  98.500 cm/s LVOT VTI:    0.374 m AI PHT:      493 msec   AORTA Ao Root diam: 2.30 cm  MITRAL VALVE                          TRICUSPID VALVE MV Area (PHT): 3.60 cm              TR Peak grad:   38.9 mmHg MV PHT:        61.19 msec            TR Vmax:        312.00 cm/s MV Decel Time:  211 msec MR Peak grad: 108.6 mmHg             SHUNTS MR Vmax:      521.00 cm/s            Systemic VTI:  0.37 m MV E velocity: 117.00 cm/s 103 cm/s  Systemic Diam: 1.70 cm MV A velocity: 129.00 cm/s 70.3 cm/s MV E/A ratio:  0.91        1.5  Antimicrobials: Anti-infectives (From admission, onward)   Start     Dose/Rate Route Frequency Ordered Stop   06/23/19 0700  vancomycin (VANCOCIN) 1,250 mg in sodium chloride 0.9 % 250 mL IVPB     1,250 mg 166.7 mL/hr over 90 Minutes Intravenous Every 12 hours 06/22/19 1637     06/23/19 0000  cefTRIAXone (ROCEPHIN) IVPB     2 g Intravenous Every 24 hours 06/23/19 1523 08/03/19 2359   06/23/19 0000  vancomycin IVPB     1,250 mg Intravenous Every 12 hours 06/23/19 1523 08/03/19 2359   06/22/19 1900  vancomycin (VANCOCIN) 1,500 mg in sodium chloride 0.9 % 500 mL IVPB     1,500 mg 250 mL/hr over 120 Minutes Intravenous  Once 06/22/19 1637 06/23/19 0021   06/22/19 1800  cefTRIAXone (ROCEPHIN) 2 g in sodium chloride 0.9 % 100 mL IVPB     2 g 200 mL/hr over 30 Minutes Intravenous Every 24 hours 06/22/19 1631     06/20/19 1600  vancomycin (VANCOCIN) IVPB 1000 mg/200 mL premix  Status:  Discontinued     1,000 mg 200 mL/hr over 60 Minutes Intravenous Every 1 hr x 2 06/20/19 1522 06/20/19 1526   06/20/19 1515  cefTRIAXone (ROCEPHIN) 2 g in sodium chloride 0.9 % 100 mL IVPB  Status:  Discontinued     2 g 200 mL/hr over 30 Minutes Intravenous  Once 06/20/19 1514 06/20/19 1526     Subjective: Seen and examined at bedside and states that her back pain is doing okay.  No nausea or vomiting.  Denies lightheadedness or dizziness.  Received a PICC line this a.m. in her right arm.  No other concerns or complaints at this time.  Objective: Vitals:   06/24/19 0522 06/24/19 0755 06/24/19 0900 06/24/19 1336   BP: 127/67 140/73  (!) 143/65  Pulse: 76 67  75  Resp: '18 17  15  '$ Temp: 98 F (36.7 C) 98.8 F (37.1 C)  98.1 F (36.7 C)  TempSrc: Oral Oral  Oral  SpO2: 98% 95% 100% 98%  Weight:      Height:       No intake or output data in the 24 hours ending 06/24/19 1355 Filed Weights   06/20/19 1347 06/20/19 1533 06/23/19 0500  Weight: 88.5 kg 88.5 kg 95.8 kg   Examination: Physical Exam:  Constitutional: WN/WD obese Caucasian female currently no acute distress NAD and appears calm  Eyes: Lids and conjunctivae normal, sclerae anicteric  ENMT: External Ears, Nose appear normal. Grossly normal hearing. Mucous membranes are moist.   Neck: Appears normal, supple, no cervical masses, normal ROM, no appreciable thyromegaly; no JVD Respiratory: Diminished to auscultation bilaterally, no wheezing, rales, rhonchi or crackles. Normal respiratory effort and patient is not tachypenic. No accessory muscle use.  Cardiovascular: RRR, no murmurs / rubs / gallops. S1 and S2 auscultated. Non pitting lower extremity edema Abdomen: Soft, non-tender, non-distended. Bowel sounds positive x4.  GU: Deferred. Musculoskeletal: No clubbing / cyanosis of digits/nails. No joint deformity upper and lower extremities. Has a Right  Arm PICC Skin: No rashes, lesions, ulcers on a limited skin evaluation. No induration; Warm and dry.  Neurologic: CN 2-12 grossly intact with no focal deficits. Romberg sign and cerebellar reflexes not assessed.  Psychiatric: Normal judgment and insight. Alert and oriented x 3. Normal mood and appropriate affect.   Data Reviewed: I have personally reviewed following labs and imaging studies  CBC: Recent Labs  Lab 06/20/19 1551 06/21/19 0525 06/22/19 0406 06/23/19 0235 06/24/19 0305  WBC 6.3 6.1 6.6 8.1 7.7  NEUTROABS 3.6  --  2.2 3.9 4.7  HGB 12.1 11.5* 11.3* 11.2* 11.3*  HCT 39.7 37.6 35.6* 35.9* 35.2*  MCV 91.1 91.0 87.7 89.5 88.7  PLT 209 210 204 195 948   Basic Metabolic  Panel: Recent Labs  Lab 06/20/19 1551 06/21/19 0525 06/22/19 0406 06/23/19 0235 06/24/19 0305  NA 140 140 142 140 141  K 4.2 3.9 3.4* 3.2* 3.9  CL 105 103 102 104 105  CO2 '26 27 28 24 24  '$ GLUCOSE 109* 109* 101* 88 101*  BUN '16 13 11 11 8  '$ CREATININE 0.49 0.47 0.52 0.45 0.54  CALCIUM 9.3 9.0 9.1 8.9 9.1  MG  --  2.1 2.1 2.0 2.0  PHOS  --   --  4.2 4.2 3.6   GFR: Estimated Creatinine Clearance: 76.2 mL/min (by C-G formula based on SCr of 0.54 mg/dL). Liver Function Tests: Recent Labs  Lab 06/20/19 1551 06/22/19 0406 06/23/19 0235 06/24/19 0305  AST '29 23 25 25  '$ ALT '27 25 24 27  '$ ALKPHOS 71 62 53 56  BILITOT 0.3 0.7 0.7 1.0  PROT 8.0 6.9 6.5 6.9  ALBUMIN 4.5 3.8 3.6 3.8   No results for input(s): LIPASE, AMYLASE in the last 168 hours. No results for input(s): AMMONIA in the last 168 hours. Coagulation Profile: Recent Labs  Lab 06/20/19 1551  INR 1.0   Cardiac Enzymes: No results for input(s): CKTOTAL, CKMB, CKMBINDEX, TROPONINI in the last 168 hours. BNP (last 3 results) No results for input(s): PROBNP in the last 8760 hours. HbA1C: Recent Labs    06/24/19 0305  HGBA1C 6.1*   CBG: No results for input(s): GLUCAP in the last 168 hours. Lipid Profile: No results for input(s): CHOL, HDL, LDLCALC, TRIG, CHOLHDL, LDLDIRECT in the last 72 hours. Thyroid Function Tests: No results for input(s): TSH, T4TOTAL, FREET4, T3FREE, THYROIDAB in the last 72 hours. Anemia Panel: Recent Labs    06/24/19 0305  VITAMINB12 605  FOLATE 33.9  FERRITIN 76  TIBC 335  IRON 53  RETICCTPCT 1.1   Sepsis Labs: Recent Labs  Lab 06/20/19 1550  LATICACIDVEN 1.0    Recent Results (from the past 240 hour(s))  Blood Culture (routine x 2)     Status: None (Preliminary result)   Collection Time: 06/20/19  3:40 PM   Specimen: BLOOD LEFT FOREARM  Result Value Ref Range Status   Specimen Description BLOOD LEFT FOREARM  Final   Special Requests   Final    BOTTLES DRAWN AEROBIC  AND ANAEROBIC Blood Culture adequate volume   Culture   Final    NO GROWTH 4 DAYS Performed at [facility], [address] [place], [place] [address]    Report Status PENDING  Incomplete  Blood Culture (routine x 2)     Status: None (Preliminary result)   Collection Time: 06/20/19  3:51 PM   Specimen: BLOOD RIGHT ARM  Result Value Ref Range Status   Specimen Description BLOOD RIGHT ARM  Final  Special Requests   Final    BOTTLES DRAWN AEROBIC AND ANAEROBIC Blood Culture adequate volume   Culture   Final    NO GROWTH 4 DAYS Performed at [facility], [address], [place], [place] [address]    Report Status PENDING  Incomplete  SARS CORONAVIRUS 2 (TAT 6-24 HRS) Nasopharyngeal Nasopharyngeal Swab     Status: None   Collection Time: 06/20/19  5:40 PM   Specimen: Nasopharyngeal Swab  Result Value Ref Range Status   SARS Coronavirus 2 NEGATIVE NEGATIVE Final    Comment: (NOTE) SARS-CoV-2 target nucleic acids are NOT DETECTED. The SARS-CoV-2 RNA is generally detectable in upper and lower respiratory specimens during the acute phase of infection. Negative results do not preclude SARS-CoV-2 infection, do not rule out co-infections with other pathogens, and should not be used as the sole basis for treatment or other patient management decisions. Negative results must be combined with clinical observations, patient history, and epidemiological information. The expected result is Negative. Fact Sheet for Patients: SugarRoll.be Fact Sheet for Healthcare Providers: https://www.woods-mathews.com/ This test is not yet approved or cleared by the [place] FDA and  has been authorized for detection and/or diagnosis of SARS-CoV-2 by FDA under an Emergency Use Authorization (EUA). This EUA will remain  in effect (meaning this test can be used) for the duration of the COVID-19 declaration under Section 56 4(b)(1) of the Act, 21 U.S.C. section  360bbb-3(b)(1), unless the authorization is terminated or revoked sooner. Performed at [facility], [place] [address], [place], [place] [address]   Culture, blood (routine x 2)     Status: None (Preliminary result)   Collection Time: 06/22/19  7:42 AM   Specimen: BLOOD RIGHT FOREARM  Result Value Ref Range Status   Specimen Description BLOOD RIGHT FOREARM  Final   Special Requests   Final    BOTTLES DRAWN AEROBIC ONLY Blood Culture results may not be optimal due to an inadequate volume of blood received in culture bottles   Culture   Final    NO GROWTH 2 DAYS Performed at [facility], [place] [address] [place], [place] [address]    Report Status PENDING  Incomplete  Culture, blood (routine x 2)     Status: None (Preliminary result)   Collection Time: 06/22/19  7:46 AM   Specimen: BLOOD LEFT FOREARM  Result Value Ref Range Status   Specimen Description BLOOD LEFT FOREARM  Final   Special Requests   Final    BOTTLES DRAWN AEROBIC ONLY Blood Culture adequate volume   Culture   Final    NO GROWTH 2 DAYS Performed at [facility], [place] [address] [place], [place] [address]    Report Status PENDING  Incomplete  Body fluid culture     Status: None (Preliminary result)   Collection Time: 06/22/19  4:01 PM   Specimen: PATH Disc; Body Fluid  Result Value Ref Range Status   Specimen Description FLUID  Final   Special Requests DISC SPACE ASPIRATION  Final   Gram Stain   Final    FEW WBC PRESENT,BOTH PMN AND MONONUCLEAR NO ORGANISMS SEEN    Culture   Final    NO GROWTH 2 DAYS Performed at [facility], [address], [place], [place] [address]    Report Status PENDING  Incomplete    Radiology Studies: Dg Orthopantogram  Result Date: 06/23/2019 CLINICAL DATA:  Discitis, osteomyelitis and epidural infection. EXAM: ORTHOPANTOGRAM/PANORAMIC COMPARISON:  None. FINDINGS:  There are no appreciable dental caries or periapical abscesses. Impacted right mandibular third  molar. The maxillary sinuses are clear. IMPRESSION: No appreciable dental caries or periapical abscesses or other significant abnormalities. Electronically Signed   By: Lorriane Shire M.D.   On: 06/23/2019 08:43   [place] Ekg Site Rite  Result Date: 06/22/2019 If Site Rite image not attached, placement could not be confirmed due to current cardiac rhythm.   Scheduled Meds:  aspirin EC  81 mg Oral Daily   baclofen  20 mg Oral TID   Chlorhexidine Gluconate Cloth  6 each Topical Daily   felodipine  2.5 mg Oral Daily   fluvoxaMINE  100 mg Oral QHS   oxymetazoline  1 spray Each Nare BID   pramipexole  0.25 mg Oral Daily   sodium chloride flush  10-40 mL Intracatheter Q12H   sodium chloride flush  3 mL Intravenous Q12H   Continuous Infusions:  cefTRIAXone (ROCEPHIN)  IV 2 g (06/23/19 1759)   vancomycin 1,250 mg (06/24/19 1038)    LOS: 4 days   Kerney Elbe, DO [facility] P[facility]ER is on AMION  If 7PM-7AM, please contact night-coverage [email or web address]

## 2019-06-24 NOTE — Progress Notes (Signed)
Peripherally Inserted Central Catheter/Midline Placement  The IV Nurse has discussed with the patient and/or persons authorized to consent for the patient, the purpose of this procedure and the potential benefits and risks involved with this procedure.  The benefits include less needle sticks, lab draws from the catheter, and the patient may be discharged home with the catheter. Risks include, but not limited to, infection, bleeding, blood clot (thrombus formation), and puncture of an artery; nerve damage and irregular heartbeat and possibility to perform a PICC exchange if needed/ordered by physician.  Alternatives to this procedure were also discussed.  Bard Power PICC patient education guide, fact sheet on infection prevention and patient information card has been provided to patient /or left at bedside.    PICC/Midline Placement Documentation  PICC Single Lumen 70/26/37 PICC Right Basilic 36 cm 0 cm (Active)  Indication for Insertion or Continuance of Line Home intravenous therapies (PICC only) 06/24/19 1015  Exposed Catheter (cm) 0 cm 06/24/19 1015  Site Assessment Clean;Dry;Intact 06/24/19 1015  Line Status Flushed;Saline locked;Blood return noted 06/24/19 1015  Dressing Type Transparent 06/24/19 1015  Dressing Status Clean;Dry;Intact;Antimicrobial disc in place 06/24/19 [place] checked and tightened 06/24/19 1015  Line Adjustment (NICU/IV Team Only) No 06/24/19 1015  Dressing Intervention New dressing 06/24/19 1015  Dressing Change Due 07/01/19 06/24/19 1015       Rolena Infante 06/24/2019, 10:16 AM

## 2019-06-24 NOTE — Care Management (Signed)
Spoke w attending, per ID, plan is for DC Sunday. I have let [facility] and [facility] know to plan for discharge tomorrow.

## 2019-06-24 NOTE — Progress Notes (Signed)
Attempted to contact RN.   Will plan on PICC placement this am.

## 2019-06-25 DIAGNOSIS — R7303 Prediabetes: Secondary | ICD-10-CM

## 2019-06-25 LAB — CULTURE, BLOOD (ROUTINE X 2)
Culture: NO GROWTH
Culture: NO GROWTH
Special Requests: ADEQUATE
Special Requests: ADEQUATE

## 2019-06-25 LAB — BODY FLUID CULTURE: Culture: NO GROWTH

## 2019-06-25 LAB — CBC WITH DIFFERENTIAL/PLATELET
Abs Immature Granulocytes: 0.03 10*3/uL (ref 0.00–0.07)
Basophils Absolute: 0.1 10*3/uL (ref 0.0–0.1)
Basophils Relative: 1 %
Eosinophils Absolute: 0.3 10*3/uL (ref 0.0–0.5)
Eosinophils Relative: 4 %
HCT: 36 % (ref 36.0–46.0)
Hemoglobin: 11.4 g/dL — ABNORMAL LOW (ref 12.0–15.0)
Immature Granulocytes: 0 %
Lymphocytes Relative: 34 %
Lymphs Abs: 3 10*3/uL (ref 0.7–4.0)
MCH: 28.4 pg (ref 26.0–34.0)
MCHC: 31.7 g/dL (ref 30.0–36.0)
MCV: 89.6 fL (ref 80.0–100.0)
Monocytes Absolute: 0.8 10*3/uL (ref 0.1–1.0)
Monocytes Relative: 9 %
Neutro Abs: 4.7 10*3/uL (ref 1.7–7.7)
Neutrophils Relative %: 52 %
Platelets: 191 10*3/uL (ref 150–400)
RBC: 4.02 MIL/uL (ref 3.87–5.11)
RDW: 14.3 % (ref 11.5–15.5)
WBC: 8.8 10*3/uL (ref 4.0–10.5)
nRBC: 0 % (ref 0.0–0.2)

## 2019-06-25 LAB — COMPREHENSIVE METABOLIC PANEL
ALT: 28 U/L (ref 0–44)
AST: 33 U/L (ref 15–41)
Albumin: 3.7 g/dL (ref 3.5–5.0)
Alkaline Phosphatase: 54 U/L (ref 38–126)
Anion gap: 12 (ref 5–15)
BUN: 8 mg/dL (ref 8–23)
CO2: 22 mmol/L (ref 22–32)
Calcium: 8.9 mg/dL (ref 8.9–10.3)
Chloride: 106 mmol/L (ref 98–111)
Creatinine, Ser: 0.48 mg/dL (ref 0.44–1.00)
GFR calc Af Amer: >60 mL/min (ref >60)
GFR calc non Af Amer: >60 mL/min (ref >60)
Glucose, Bld: 88 mg/dL (ref 70–99)
Potassium: 3.8 mmol/L (ref 3.5–5.1)
Sodium: 140 mmol/L (ref 135–145)
Total Bilirubin: 0.7 mg/dL (ref 0.3–1.2)
Total Protein: 6.5 g/dL (ref 6.5–8.1)

## 2019-06-25 LAB — PHOSPHORUS: Phosphorus: 3.9 mg/dL (ref 2.5–4.6)

## 2019-06-25 LAB — VANCOMYCIN, PEAK: Vancomycin Pk: 29 ug/mL — ABNORMAL LOW (ref 30–40)

## 2019-06-25 LAB — VANCOMYCIN, TROUGH: Vancomycin Tr: 16 ug/mL (ref 15–20)

## 2019-06-25 LAB — SEDIMENTATION RATE: Sed Rate: 12 mm/hr (ref 0–22)

## 2019-06-25 LAB — MAGNESIUM: Magnesium: 1.9 mg/dL (ref 1.7–2.4)

## 2019-06-25 NOTE — Discharge Summary (Signed)
Physician Discharge Summary  Shawna Lewis YBO:[id number] DOB: 1951/05/02 DOA: 06/20/2019  PCP: Sinda Du, MD  Admit date: 06/20/2019 Discharge date: 06/25/2019  Admitted From: Home Disposition: Home with Home Health  Recommendations for Outpatient Follow-up:  1. Follow up with PCP in 1-2 weeks 2. Follow up with Infectious Diseases within 3 weeks 3. Please obtain CMP/CBC in one week 4. Please follow up on the following pending results:  Home Health: YES  Equipment/Devices: Infusion Pump  Discharge Condition: Stable  CODE STATUS: FULL CODE  Diet recommendation: Heart Healthy Carb Modified Diet  Brief/Interim Summary: HPI per Dr. Babs Lewis on 06/20/2019 Shawna Maxim Russellis a 69 y.o.femalewith medical history significant forchronic low back pain, degenerative disc disease of the lumbar spine, hypertension, who is admitted to [facility] on 06/20/2019 with osteomyelitis/discitis of L2-L3after presenting from home to [facility] emergency department at the direction of her neurosurgeon followingresult of abnormaloutpatient MRI of the lumbar spine.  The patient reports a history of chronic low back pain over the last 2 to 3 years in the setting of degenerative disc disease involving the lumbar spine as well as central canal stenosis.In this context, the patient reports that she follows with Dr. Jaynee Lewis of neurologyas well asDr. Patrice Lewis, both of which are associated with [facility].In the setting of chronic, nonescalating, low back pain, the patient reports that she underwent routine follow-up MRI of the lumbar spine as an outpatient earlier today (06/20/19),as ordered by her outpatient neurosurgeon, Dr. Patrice Lewis.This MRI, which was performed with and without contrast demonstrated osteomyelitis/discitis at L2-L3,epidural phlegmon,moderate central canal stenosis, and bilateral foraminal narrowing.The patient reports that she was called with these results by  Dr. Chanda Lewis recommended presentation to the emergency department for further evaluation.  The patient reports no worsening of the intensity of her low back pain over the course of the last year. She reports chronic numbness over the dorsal and plantar aspects of the bilateral feet,which is unchanged over the last 2 to 3 years.Denies any associated saddle anesthesia. She also denies any recent weakness involving the lower extremities.Additionally, she denies any recent fecal incontinence or urinary retention. Denies any trauma over the last 1 year.  She denies any recent subjective fever, chills, rigors, or generalized myalgias.She also denies any headache, neck stiffness, sore throat, cough, nausea, vomiting, abdominal pain, diarrhea, or dysuria. Denies any recent traveling or known positive COVID-19 exposures. She denies any recent CNS procedures, and denies any history of intravenous drug use. Additionally, the patient reports that she is on no blood thinning agents at home, including no aspirin,in spite of her home medication list including thisanti-platelet med.   ED Course:Vital signs in the emergency department were notable for the following: Temperature max 99.5; heart rate 70-77; blood pressure 197/84; respiratory rate 15-18, and oxygen saturation 97 to 99% on room air.  Labs in the ED were notable for the following: CMP notable for bicarbonate 26, creatinine 0.49, glucose 109. CBC notable for white blood cell count of 6300 with 58% neutrophils. Lactic acid 1.0. CRP 0.8. ESR 22. Urinalysis showed no white blood cells, and was leukocyte esterase/nitrite negative. Nasopharyngeal COVID-19 swab was performed in the ED today, with result currently pending. Blood cultures x2 are also collected in the ED today prior to the initiation of any antibiotics.  The patient's case and imaging were discussed withthe APP for Dr. Kathyrn Lewis ofneurosurgery, who recommended  admission to the hospital service with consultation of infectious disease as well as interventional radiology, with plan  for neurosurgery toformallyconsult on the patient in the morning.The patient's case was also discussed with the on-call infectious disease physician, Dr. Valla Lewis recommended lumbar aspiration and cultures to be performed by interventional radiology. Dr. Ferman Lewis that antibiotics be held until these cultures are obtained, following which he recommended the initiation of vancomycin as well as cefepime.Subsequently, case was discussed with Dr. Laurence Lewis of interventional radiology, whorecommendedIR-guided lumbar aspiration at [facility] on themorning of 06/21/2019. Dr. Pennie Lewis that the patient be n.p.o. after midnight and and that patient no receive anyblood thinning agents leading up to this procedure.  Subsequently, the patient was admitted to [facility] for further evaluation and management of presenting osteomyelitis/discitisat L2-L3,including formal neurosurgery consultation, formal infectious disease consultation, and plan forIR-guidedlumbar aspiration withassociated cultures.While in [facility] ED,no IV fluids or medications were administered  **Interim History Patient underwent IR guided biopsy and was started on IV Vancomycin and Ceftriaxone. Neurosurgery infectious diseases and IR all involved patient's care. ID checking TTE and recommend PICC Placement. Disc Space Aspiration showed No Organisms seen and NGTD <24 Hours so far. Unfortunately she had a dose interruption of her IV vancomycin due to infiltrated IV and I spoke with infectious disease Dr. Prince Lewis who feels that we will need to adequately get vancomycin trough level prior to discharge and this was done today.  Unfortunately yesterday she had a Vanco peak level which been drawn while she was getting infused with vancomycin.  Trough level was normal at 16.  Infectious diseases  has made final recommendations and recommending continuing IV vancomycin and IV ceftriaxone and she was deemed stable for discharge and will need to follow-up with PCP, infectious diseases, as well as Neurosurgery in the outpatient setting  Discharge Diagnoses:  Principal Problem:   Diskitis Active Problems:   Osteomyelitis ([place])   HTN (hypertension)   DDD (degenerative disc disease), lumbar   Migraine   Chronic low back pain   Central stenosis of spinal canal   Chronic left lumbar radiculopathy  Discitis/Osteomyelitis of L2/L3 with associated extensive endplate destructive changes. -After collaboration with ID, neurosurgery decision was made toobtain IR guided lumbar aspiration prior to initiation of antibiotics.  -After the biopsy Dr. Baxter Flattery recommended initiation of vancomycin and cefepime but now ID recommending IV Vancomycin/IV Ceftriaxone following her lumbar spine aspiration and this has been started -ID recommending repeating Blood Cx x2 for the next 3 days and this has been ordered -Blood cultures on 06/20/2019 showed no growth to date at 4 days -Blood cultures from 06/22/2019 showed NGTD at 2 Days -Body fluid culture still pending and showed no organisms present showed no growth to date at 2 Days -Currently still awaiting her lumbar spine aspiration and biopsy -Neurosurgery consulted on the patient at [facility] and Dr Shawna Lewis felt that because she remains neurologically intact, he recommends medical management because there is no frank abscess that could be addressed surgically and recommends an IR biopsy and follow-up with Dr. Patrice Lewis in the outpatient setting.  -IV fentanyl ordered as needed for pain. Follow CBC and sed rate and CRP. -Resumed her home dosing of oxycodone as well at 10 mg -Follow-up biopsy results and now started on antibiotics next -Continue IV vancomycin and IV ceftriaxone per ID recommendations and OPAT orders have been placed with final recommendations by  ID on Sunday likely when IV Vancomycin trough level can be obtained -ESR was 20 an repeat was 20  -Orthopantogram showed "No appreciable dental caries or periapical abscesses or other significant abnormalities." -ID checking TTE  and showed " Left ventricular ejection fraction, by visual estimation, is 55 to 60%. The left ventricle has normal function. There is no left ventricular hypertrophy. Left ventricular diastolic parameters were normal." -There is no mention of endocarditis on TTE -OPAT orders are in place -Continue monitor and and will D/C today  -PICC line placed this AM and Vancomycin trough level was 16 -Patient will continue IV ceftriaxone IV vancomycin with the last day of therapy being 08/03/2019 and she will need to have weekly labs of CBC with differential and BMP weekly and labs every other week of ESR and CRP as well as a vancomycin trough done in the outpatient setting and faxed to the infectious disease office  Essential Hypertension -Currently uncontrolled -Heroralblood pressure medications were temporarily held but now Felodipine has been resumed  -Pressure this morning was improved to 144/63 -Continue with home furosemide as needed -IV labetalol ordered as needed for elevated BP readings.  Chronic Low Back Pain with Radiculopathy/Lumbar DDD -Patient has radiological findings of degenerative disc disease of the lumbar spine associated with central canal stenosis and bilateral foraminal narrowing and chronic back pain has persisted for the last 2 to 3 years.  -She has IV fentanyl ordered for pain with 25 mcg every 2 hours as needed for moderate pain. Also resumed her Oxycodone IR 10 mg every 3 hours as needed for breakthrough pain  -Also resumed her home baclofen 20 mg p.o. 3 times daily -Her pain is well-managed at this time with ambulation. She has no acute focal neurological deficits or red flag symptoms. -We will stop diclofenac at discharge because of concern for  nephrotoxicity as she is will be placed on vancomycin  Depression and Anxiety/Fibromyalgia -Continue with Fluvoxamine 100 g p.o. nightly  History of Migraine Headaches -Chronic and stable -Resumed home phenobarbital 64.8 mg p.o. daily as needed for onset of migraine  Normocytic Anemia -Patient's Hgb/Hct went from 12.1/39.7 -> 11.5/37.6 -> 11.3/35.6 -> 11.2/35.9 -> 11.3/35.2 -> 11.4/36.0 -Checked Anemia Panel and showed an iron level of 53, U IBC of 282, TIBC of 235, saturation ratios of 60%, ferritin level 76, folate level 33.9, vitamin B12 level 605 -Continue to Monitor for S/Sx of Bleeding; Currently no overt bleeding noted -Repeat CBC within 1 week   Hyperglycemia in the setting of a Hx of Pre-Diabetes -Patient's Blood sugars have been elevated since admission on Daily BMP/CMP -Ranging from 101-109 -Checked  HbA1c and now is 6.1; Last HbA1c was 5.8 and was years ago  -Continue to Monitor Blood Sugars Carefully and if necessary will place on Sensitive Novolog SSI AC  Hypokalemia -Patient's K+ yesterday AM was 3.2 and now improved to 3.8 -Continue to Monitor and Replete as Necessary -Repeat CMP within 1 week  Obesity -Estimated body mass index is 37.41 kg/m as calculated from the following:   Height as of this encounter: 5' 3"  (1.6 m).   Weight as of this encounter: 95.8 kg. -Weight Loss and Dietary Counseling given -Will consult Nutrition for further evaluation and Recc's  Discharge Instructions  Discharge Instructions    Activity as tolerated - No restrictions   Complete by: As directed    Call MD for:  difficulty breathing, headache or visual disturbances   Complete by: As directed    Call MD for:  extreme fatigue   Complete by: As directed    Call MD for:  hives   Complete by: As directed    Call MD for:  persistant dizziness or light-headedness   Complete  by: As directed    Call MD for:  persistant nausea and vomiting   Complete by: As directed    Call MD  for:  redness, tenderness, or signs of infection (pain, swelling, redness, odor or green/yellow discharge around incision site)   Complete by: As directed    Call MD for:  severe uncontrolled pain   Complete by: As directed    Call MD for:  temperature >100.4   Complete by: As directed    Diet - low sodium heart healthy   Complete by: As directed    Diet Carb Modified   Complete by: As directed    Discharge instructions   Complete by: As directed    You were cared for by a hospitalist during your hospital stay. If you have any questions about your discharge medications or the care you received while you were in the hospital after you are discharged, you can call the unit and ask to speak with the hospitalist on call if the hospitalist that took care of you is not available. Once you are discharged, your primary care physician will handle any further medical issues. Please note that NO REFILLS for any discharge medications will be authorized once you are discharged, as it is imperative that you return to your primary care physician (or establish a relationship with a primary care physician if you do not have one) for your aftercare needs so that they can reassess your need for medications and monitor your lab values.  Follow up with PCP, Infectious Diseases, and Neurosurgery. Take all medications as prescribed. If symptoms change or worsen please return to the ED for evaluation   Home infusion instructions [facility] May follow [place] Dosing Protocol; May administer Cathflo as needed to maintain patency of vascular access device.; Flushing of vascular access device: per [facility] Protocol: 0.9% NaCl pre/post medica...   Complete by: As directed    Instructions: May follow [place] Dosing Protocol   Instructions: May administer Cathflo as needed to maintain patency of vascular access device.   Instructions: Flushing of vascular access device: per [facility] Protocol: 0.9% NaCl pre/post  medication administration and prn patency; Heparin 100 u/ml, 67m for implanted ports and Heparin 10u/ml, 586mfor all other central venous catheters.   Instructions: May follow [facility] Anaphylaxis Protocol for First Dose Administration in the home: 0.9% NaCl at 25-50 ml/hr to maintain IV access for protocol meds. Epinephrine 0.3 ml IV/IM PRN and Benadryl 25-50 IV/IM PRN s/s of anaphylaxis.   Instructions: AdInterlachennfusion Coordinator (RN) to assist per patient IV care needs in the home PRN.   Increase activity slowly   Complete by: As directed      Allergies as of 06/25/2019      Reactions   Latex       Medication List    STOP taking these medications   diclofenac 75 MG EC tablet Commonly known as: VOLTAREN     TAKE these medications   aspirin EC 81 MG tablet Take 81 mg by mouth daily.   baclofen 20 MG tablet Commonly known as: LIORESAL Take 20 mg by mouth 3 (three) times daily.   cefTRIAXone  IVPB Commonly known as: ROCEPHIN Inject 2 g into the vein daily. Indication: Discitis  Last Day of Therapy:  08/03/19 Labs - Once weekly:  CBC/D and BMP, Labs - Every other week:  ESR and CRP   felodipine 2.5 MG 24 hr tablet Commonly known as: PLENDIL Take 2.5 mg  by mouth daily.   fluvoxaMINE 100 MG tablet Commonly known as: LUVOX Take 100 mg by mouth at bedtime.   FUROSEMIDE PO Take by mouth daily as needed.   Oxycodone HCl 10 MG Tabs Take 10 mg by mouth every 4 (four) hours as needed.   PHENobarbital 64.8 MG tablet Commonly known as: LUMINAL Take 64.8 mg by mouth daily as needed. 1.5 tabs at onset of migraine as needed   pramipexole 0.25 MG tablet Commonly known as: MIRAPEX Take 0.25 mg by mouth daily.   vancomycin  IVPB Inject 1,250 mg into the vein every 12 (twelve) hours. Indication: Discitis  Last Day of Therapy:  08/03/2019  Labs - Sunday/Monday:  CBC/D, BMP, and vancomycin trough. Labs - Thursday:  BMP and vancomycin trough Labs - Every other week:  ESR  and CRP            Home Infusion Instuctions  (From admission, onward)         Start     Ordered   06/23/19 0000  Home infusion instructions [facility] May follow [facility] Dosing Protocol; May administer Cathflo as needed to maintain patency of vascular access device.; Flushing of vascular access device: per [facility] Protocol: 0.9% NaCl pre/post medica...    Question Answer Comment  Instructions May follow [facility] Dosing Protocol   Instructions May administer Cathflo as needed to maintain patency of vascular access device.   Instructions Flushing of vascular access device: per [facility] Protocol: 0.9% NaCl pre/post medication administration and prn patency; Heparin 100 u/ml, 5ml for implanted ports and Heparin 10u/ml, 5ml for all other central venous catheters.   Instructions May follow [facility] Anaphylaxis Protocol for First Dose Administration in the home: 0.9% NaCl at 25-50 ml/hr to maintain IV access for protocol meds. Epinephrine 0.3 ml IV/IM PRN and Benadryl 25-50 IV/IM PRN s/s of anaphylaxis.   Instructions [facility] Infusion Coordinator (RN) to assist per patient IV care needs in the home PRN.      12 /04/20 1523         Follow-up Information    [facility] Follow up.   Specialty: [place] Why: The home health agency will contact you for the first appointment Contact information: [place] [phone]        [facility] for Infectious Disease Follow up on 06/30/2019.   Specialty: Infectious Diseases Why: MyChart Video Visit set up with Colletta Maryland, NP @ 9:45 a.m. Please see details in your MyChart regarding logging onto session. Please log on 5-10 minutes prior to schedule time.  Contact information: [place], [place] [phone] [place]       Sinda Du, MD. Call.   Specialty: Pulmonary Disease Why: Follow up within 1-2  weeks  Contact information: [address] [place] [address] [phone]        Starling Manns, MD Follow up.   Specialty: Orthopedic Surgery Contact information: [address] [place] [address] [phone]          Allergies  Allergen Reactions  . Latex    Consultations:  Neurosugery  Infectious Diseases  Interventional Radiology  Procedures/Studies: Dg Orthopantogram  Result Date: 06/23/2019 CLINICAL DATA:  Discitis, osteomyelitis and epidural infection. EXAM: ORTHOPANTOGRAM/PANORAMIC COMPARISON:  None. FINDINGS: There are no appreciable dental caries or periapical abscesses. Impacted right mandibular third molar. The maxillary sinuses are clear. IMPRESSION: No appreciable dental caries or periapical abscesses  or other significant abnormalities. Electronically Signed   By: Lorriane Shire M.D.   On: 06/23/2019 08:43   Mr Lumbar Spine W Wo Contrast  Result Date: 06/20/2019 CLINICAL DATA:  Chronic low back pain and left leg numbness. EXAM: MRI LUMBAR SPINE WITHOUT AND WITH CONTRAST TECHNIQUE: Multiplanar and multiecho pulse sequences of the lumbar spine were obtained without and with intravenous contrast. CONTRAST:  7 mL GADAVIST IV SOLN COMPARISON:  MRI lumbar spine 08/02/2018. FINDINGS: Segmentation:  Standard. Alignment: Convex right scoliosis is noted. There is trace retrolisthesis L3 on L4 and L4 on L5. Trace anterolisthesis L5 on S1 is noted. Vertebrae: Extensive endplate destructive change is present at the L2-3 level. There is fluid in the disc interspace and intense edema and enhancement throughout both vertebral bodies consistent with discitis and osteomyelitis. Paraspinous inflammatory change is seen but no rim enhancing fluid collection is identified. There is epidural phlegmon and disc at L2-3 causing moderate to moderately severe central canal stenosis and left worse than right subarticular recess narrowing. Severe bilateral foraminal  narrowing is also present. Conus medullaris and cauda equina: Conus extends to the L2 level. Conus and cauda equina appear normal. Paraspinal and other soft tissues: As described above. Otherwise unremarkable. Disc levels: T10-11 is imaged in the sagittal plane only. There is loss of disc space height and a minimal bulge without stenosis. T11-12: Minimal disc bulge without stenosis. T12-L1: Loss of disc space height and a shallow bulge without stenosis. L1-2: Shallow disc bulge and endplate spur without stenosis. L2-3: As described above. The appearance is worse than on the prior MRI. Central canal and foraminal stenosis is worse than on the prior MRI. L3-4: Mild disc bulge and mild-to-moderate facet arthropathy. Mild central canal and bilateral foraminal narrowing. No change. L4-5: Moderate to advanced bilateral facet degenerative disease and a shallow disc bulge. The central canal is open. Mild to moderate foraminal narrowing is worse on the right. No change. L5-S1: Advanced bilateral facet degenerative disease. The disc is uncovered with a shallow bulge. There is mild central canal and right foraminal narrowing. The left foramen is open. 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 Epidural phlegmon and disc cause moderate to moderately severe central canal stenosis and marked bilateral foraminal narrowing. The appearance is much worse than on the prior MRI. These results will be called to the ordering clinician or representative by the Radiologist Assistant, and communication documented in the PACS or zVision Dashboard. Electronically Signed   By: Inge Rise M.D.   On: 06/20/2019 08:14   [place] Ekg Site Rite  Result Date: 06/22/2019 If Site Rite image not attached, placement could not be confirmed due to current cardiac rhythm.   ECHOCARDIOGRAM IMPRESSIONS   1. Left ventricular ejection fraction, by visual estimation, is 55 to 60%. The  left ventricle has normal function. There is no left ventricular hypertrophy. 2. Global right ventricle has normal systolic function.The right ventricular size is normal. No increase in right ventricular wall thickness. 3. Left atrial size was normal. 4. Right atrial size was normal. 5. The mitral valve is normal in structure. No evidence of mitral valve regurgitation. 6. The tricuspid valve is normal in structure. Tricuspid valve regurgitation is trivial. 7. The aortic valve is normal in structure. Aortic valve regurgitation is mild. 8. The pulmonic valve was normal in structure. Pulmonic valve regurgitation is not visualized. 9. Moderately elevated pulmonary artery systolic pressure. 10. The atrial septum is grossly normal.  FINDINGS Left Ventricle: Left  ventricular ejection fraction, by visual estimation, is 55 to 60%. The left ventricle has normal function. There is no left ventricular hypertrophy. Left ventricular diastolic parameters were normal.  Right Ventricle: The right ventricular size is normal. No increase in right ventricular wall thickness. Global RV systolic function is has normal systolic function. The tricuspid regurgitant velocity is 3.12 m/s, and with an assumed right atrial pressure of 8 mmHg, the estimated right ventricular systolic pressure is moderately elevated at 46.9 mmHg.  Left Atrium: Left atrial size was normal in size.  Right Atrium: Right atrial size was normal in size  Pericardium: There is no evidence of pericardial effusion.  Mitral Valve: The mitral valve is normal in structure. No evidence of mitral valve regurgitation.  Tricuspid Valve: The tricuspid valve is normal in structure. Tricuspid valve regurgitation is trivial.  Aortic Valve: The aortic valve is normal in structure. Aortic valve regurgitation is mild. Aortic regurgitation PHT measures 493 msec.  Pulmonic Valve: The pulmonic valve was normal in structure. Pulmonic valve  regurgitation is not visualized.  Aorta: The aortic root and ascending aorta are structurally normal, with no evidence of dilitation.  IAS/Shunts: The atrial septum is grossly normal.   LEFT VENTRICLE PLAX 2D LVIDd: 4.73 cm Diastology LVIDs: 3.34 cm LV e' lateral: 11.90 cm/s LV PW: 0.98 cm LV E/e' lateral: 9.8 LV IVS: 1.02 cm LV e' medial: 8.70 cm/s LVOT diam: 1.70 cm LV E/e' medial: 13.4 LV SV: 58 ml LV SV Index: 27.70 LVOT Area: 2.27 cm   RIGHT VENTRICLE RV Basal diam: 2.83 cm RV S prime: 16.90 cm/s TAPSE (M-mode): 3.4 cm  LEFT ATRIUM Index RIGHT ATRIUM Index LA diam: 4.50 cm 2.27 cm/m RA Area: 13.00 cm LA Vol (A2C): 57.7 ml 29.16 ml/m RA Volume: 30.00 ml 15.16 ml/m LA Vol (A4C): 44.6 ml 22.54 ml/m LA Biplane Vol: 52.0 ml 26.28 ml/m AORTIC VALVE LVOT Vmax: 161.00 cm/s LVOT Vmean: 98.500 cm/s LVOT VTI: 0.374 m AI PHT: 493 msec  AORTA Ao Root diam: 2.30 cm  MITRAL VALVE TRICUSPID VALVE MV Area (PHT): 3.60 cm TR Peak grad: 38.9 mmHg MV PHT: 61.19 msec TR Vmax: 312.00 cm/s MV Decel Time: 211 msec MR Peak grad: 108.6 mmHg SHUNTS MR Vmax: 521.00 cm/s Systemic VTI: 0.37 m MV E velocity: 117.00 cm/s 103 cm/s Systemic Diam: 1.70 cm MV A velocity: 129.00 cm/s 70.3 cm/s MV E/A ratio: 0.91 1.5  Subjective: Seen and examined at bedside and she is doing well.  Denies chest pain, lightheadedness or dizziness.  No nausea or vomiting.  Felt well and had no other complaints or concerns and states her back pain is controlled.  Ready to go home and will follow up with PCP, neurosurgery/orthopedic surgery, as well as infectious disease in outpatient setting.  Discharge Exam: Vitals:   06/25/19 0846 06/25/19 1237  BP: (!) 143/65 (!) 144/63   Pulse: 79 81  Resp: 17 16  Temp: 99.5 F (37.5 C) 98.7 F (37.1 C)  SpO2: 97% 96%   Vitals:   06/25/19 0012 06/25/19 0323 06/25/19 0846 06/25/19 1237  BP: 139/67 (!) 154/80 (!) 143/65 (!) 144/63  Pulse: 72 71 79 81  Resp: 18 18 17 16   Temp: 98.1 F (36.7 C) 97.7 F (36.5 C) 99.5 F (37.5 C) 98.7 F (37.1 C)  TempSrc: Oral Oral Oral Oral  SpO2: 96% 100% 97% 96%  Weight:      Height:       General: Pt is alert, awake, not in acute distress Cardiovascular:  RRR, S1/S2 +, no rubs, no gallops Respiratory: Mildly diminished bilaterally, no wheezing, no rhonchi; unlabored breathing Abdominal: Soft, NT, distended secondary to body habitus, bowel sounds + Extremities: Has nonpitting edema, no cyanosis  The results of significant diagnostics from this hospitalization (including imaging, microbiology, ancillary and laboratory) are listed below for reference.    Microbiology: Recent Results (from the past 240 hour(s))  Blood Culture (routine x 2)     Status: None   Collection Time: 06/20/19  3:40 PM   Specimen: BLOOD LEFT FOREARM  Result Value Ref Range Status   Specimen Description BLOOD LEFT FOREARM  Final   Special Requests   Final    BOTTLES DRAWN AEROBIC AND ANAEROBIC Blood Culture adequate volume   Culture   Final    NO GROWTH 5 DAYS Performed at [facility], [address], [place], [place] [address]    Report Status 06/25/2019 FINAL  Final  Blood Culture (routine x 2)     Status: None   Collection Time: 06/20/19  3:51 PM   Specimen: BLOOD RIGHT ARM  Result Value Ref Range Status   Specimen Description BLOOD RIGHT ARM  Final   Special Requests   Final    BOTTLES DRAWN AEROBIC AND ANAEROBIC Blood Culture adequate volume   Culture   Final    NO GROWTH 5 DAYS Performed at [facility], [address], [place], [place] [address]    Report Status 06/25/2019 FINAL  Final  SARS CORONAVIRUS 2 (TAT 6-24 HRS) Nasopharyngeal Nasopharyngeal Swab     Status: None   Collection  Time: 06/20/19  5:40 PM   Specimen: Nasopharyngeal Swab  Result Value Ref Range Status   SARS Coronavirus 2 NEGATIVE NEGATIVE Final    Comment: (NOTE) SARS-CoV-2 target nucleic acids are NOT DETECTED. The SARS-CoV-2 RNA is generally detectable in upper and lower respiratory specimens during the acute phase of infection. Negative results do not preclude SARS-CoV-2 infection, do not rule out co-infections with other pathogens, and should not be used as the sole basis for treatment or other patient management decisions. Negative results must be combined with clinical observations, patient history, and epidemiological information. The expected result is Negative. Fact Sheet for Patients: SugarRoll.be Fact Sheet for Healthcare Providers: https://www.woods-mathews.com/ This test is not yet approved or cleared by the [place] FDA and  has been authorized for detection and/or diagnosis of SARS-CoV-2 by FDA under an Emergency Use Authorization (EUA). This EUA will remain  in effect (meaning this test can be used) for the duration of the COVID-19 declaration under Section 56 4(b)(1) of the Act, 21 U.S.C. section 360bbb-3(b)(1), unless the authorization is terminated or revoked sooner. Performed at [facility], [place] [address], [place], [place] [address]   Culture, blood (routine x 2)     Status: None (Preliminary result)   Collection Time: 06/22/19  7:42 AM   Specimen: BLOOD RIGHT FOREARM  Result Value Ref Range Status   Specimen Description BLOOD RIGHT FOREARM  Final   Special Requests   Final    BOTTLES DRAWN AEROBIC ONLY Blood Culture results may not be optimal due to an inadequate volume of blood received in culture bottles   Culture   Final    NO GROWTH 3 DAYS Performed at [facility], [place] [address], [place], [place] [address]    Report Status PENDING  Incomplete  Culture, blood (routine x 2)     Status: None (Preliminary  result)   Collection Time: 06/22/19  7:46 AM   Specimen: BLOOD LEFT FOREARM  Result Value Ref Range Status   Specimen Description BLOOD LEFT FOREARM  Final   Special Requests   Final    BOTTLES DRAWN AEROBIC ONLY Blood Culture adequate volume   Culture   Final    NO GROWTH 3 DAYS Performed at [facility], [address], [place], [place] [address]    Report Status PENDING  Incomplete  Body fluid culture     Status: None (Preliminary result)   Collection Time: 06/22/19  4:01 PM   Specimen: PATH Disc; Body Fluid  Result Value Ref Range Status   Specimen Description FLUID  Final   Special Requests DISC SPACE ASPIRATION  Final   Gram Stain   Final    FEW WBC PRESENT,BOTH PMN AND MONONUCLEAR NO ORGANISMS SEEN    Culture   Final    NO GROWTH 3 DAYS Performed at [facility], [address], [place], [place] [address]    Report Status PENDING  Incomplete    Labs: BNP (last 3 results) No results for input(s): BNP in the last 8760 hours. Basic Metabolic Panel: Recent Labs  Lab 06/21/19 0525 06/22/19 0406 06/23/19 0235 06/24/19 0305 06/25/19 0258  NA 140 142 140 141 140  K 3.9 3.4* 3.2* 3.9 3.8  CL 103 102 104 105 106  CO2 27 28 24 24 22   GLUCOSE 109* 101* 88 101* 88  BUN 13 11 11 8 8   CREATININE 0.47 0.52 0.45 0.54 0.48  CALCIUM 9.0 9.1 8.9 9.1 8.9  MG 2.1 2.1 2.0 2.0 1.9  PHOS  --  4.2 4.2 3.6 3.9   Liver Function Tests: Recent Labs  Lab 06/20/19 1551 06/22/19 0406 06/23/19 0235 06/24/19 0305 06/25/19 0258  AST 29 23 25 25  33  ALT 27 25 24 27 28   ALKPHOS 71 62 53 56 54  BILITOT 0.3 0.7 0.7 1.0 0.7  PROT 8.0 6.9 6.5 6.9 6.5  ALBUMIN 4.5 3.8 3.6 3.8 3.7   No results for input(s): LIPASE, AMYLASE in the last 168 hours. No results for input(s): AMMONIA in the last 168 hours. CBC: Recent Labs  Lab 06/20/19 1551 06/21/19 0525 06/22/19 0406 06/23/19 0235 06/24/19 0305 06/25/19 0258  WBC 6.3 6.1 6.6 8.1 7.7 8.8  NEUTROABS 3.6  --  2.2 3.9 4.7  4.7  HGB 12.1 11.5* 11.3* 11.2* 11.3* 11.4*  HCT 39.7 37.6 35.6* 35.9* 35.2* 36.0  MCV 91.1 91.0 87.7 89.5 88.7 89.6  PLT 209 210 204 195 199 191   Cardiac Enzymes: No results for input(s): CKTOTAL, CKMB, CKMBINDEX, TROPONINI in the last 168 hours. BNP: Invalid input(s): POCBNP CBG: No results for input(s): GLUCAP in the last 168 hours. D-Dimer No results for input(s): DDIMER in the last 72 hours. Hgb A1c Recent Labs    06/24/19 0305  HGBA1C 6.1*   Lipid Profile No results for input(s): CHOL, HDL, LDLCALC, TRIG, CHOLHDL, LDLDIRECT in the last 72 hours. Thyroid function studies No results for input(s): TSH, T4TOTAL, T3FREE, THYROIDAB in the last 72 hours.  Invalid input(s): FREET3 Anemia work up Recent Labs    06/24/19 0305  VITAMINB12 605  FOLATE 33.9  FERRITIN 76  TIBC 335  IRON 53  RETICCTPCT 1.1   Urinalysis    Component Value Date/Time   COLORURINE STRAW (A) 06/20/2019 1511   APPEARANCEUR CLEAR 06/20/2019 1511   LABSPEC 1.010 06/20/2019 1511   PHURINE 6.0 06/20/2019 1511   GLUCOSEU NEGATIVE 06/20/2019 1511  HGBUR NEGATIVE 06/20/2019 1511   BILIRUBINUR NEGATIVE 06/20/2019 1511   KETONESUR NEGATIVE 06/20/2019 1511   PROTEINUR NEGATIVE 06/20/2019 1511   NITRITE NEGATIVE 06/20/2019 1511   LEUKOCYTESUR NEGATIVE 06/20/2019 1511   Sepsis Labs Invalid input(s): PROCALCITONIN,  WBC,  LACTICIDVEN Microbiology Recent Results (from the past 240 hour(s))  Blood Culture (routine x 2)     Status: None   Collection Time: 06/20/19  3:40 PM   Specimen: BLOOD LEFT FOREARM  Result Value Ref Range Status   Specimen Description BLOOD LEFT FOREARM  Final   Special Requests   Final    BOTTLES DRAWN AEROBIC AND ANAEROBIC Blood Culture adequate volume   Culture   Final    NO GROWTH 5 DAYS Performed at [facility], [address], [place], [place] [address]    Report Status 06/25/2019 FINAL  Final  Blood Culture (routine x 2)     Status: None   Collection Time: 06/20/19   3:51 PM   Specimen: BLOOD RIGHT ARM  Result Value Ref Range Status   Specimen Description BLOOD RIGHT ARM  Final   Special Requests   Final    BOTTLES DRAWN AEROBIC AND ANAEROBIC Blood Culture adequate volume   Culture   Final    NO GROWTH 5 DAYS Performed at [facility], [address] [place], [place] [address]    Report Status 06/25/2019 FINAL  Final  SARS CORONAVIRUS 2 (TAT 6-24 HRS) Nasopharyngeal Nasopharyngeal Swab     Status: None   Collection Time: 06/20/19  5:40 PM   Specimen: Nasopharyngeal Swab  Result Value Ref Range Status   SARS Coronavirus 2 NEGATIVE NEGATIVE Final    Comment: (NOTE) SARS-CoV-2 target nucleic acids are NOT DETECTED. The SARS-CoV-2 RNA is generally detectable in upper and lower respiratory specimens during the acute phase of infection. Negative results do not preclude SARS-CoV-2 infection, do not rule out co-infections with other pathogens, and should not be used as the sole basis for treatment or other patient management decisions. Negative results must be combined with clinical observations, patient history, and epidemiological information. The expected result is Negative. Fact Sheet for Patients: SugarRoll.be Fact Sheet for Healthcare Providers: https://www.woods-mathews.com/ This test is not yet approved or cleared by the [place] FDA and  has been authorized for detection and/or diagnosis of SARS-CoV-2 by FDA under an Emergency Use Authorization (EUA). This EUA will remain  in effect (meaning this test can be used) for the duration of the COVID-19 declaration under Section 56 4(b)(1) of the Act, 21 U.S.C. section 360bbb-3(b)(1), unless the authorization is terminated or revoked sooner. Performed at [facility], [place] [address] [place], [place] [address]   Culture, blood (routine x 2)     Status: None (Preliminary result)   Collection Time: 06/22/19  7:42 AM   Specimen: BLOOD RIGHT  FOREARM  Result Value Ref Range Status   Specimen Description BLOOD RIGHT FOREARM  Final   Special Requests   Final    BOTTLES DRAWN AEROBIC ONLY Blood Culture results may not be optimal due to an inadequate volume of blood received in culture bottles   Culture   Final    NO GROWTH 3 DAYS Performed at [facility], [place] [address] [place], [place] [address]    Report Status PENDING  Incomplete  Culture, blood (routine x 2)     Status: None (Preliminary result)   Collection Time: 06/22/19  7:46 AM   Specimen: BLOOD LEFT FOREARM  Result Value Ref  Range Status   Specimen Description BLOOD LEFT FOREARM  Final   Special Requests   Final    BOTTLES DRAWN AEROBIC ONLY Blood Culture adequate volume   Culture   Final    NO GROWTH 3 DAYS Performed at [facility], [address] [place], [place] [address]    Report Status PENDING  Incomplete  Body fluid culture     Status: None (Preliminary result)   Collection Time: 06/22/19  4:01 PM   Specimen: PATH Disc; Body Fluid  Result Value Ref Range Status   Specimen Description FLUID  Final   Special Requests DISC SPACE ASPIRATION  Final   Gram Stain   Final    FEW WBC PRESENT,BOTH PMN AND MONONUCLEAR NO ORGANISMS SEEN    Culture   Final    NO GROWTH 3 DAYS Performed at [facility], [address] [place], [place] [address]    Report Status PENDING  Incomplete   Time coordinating discharge: 35 minutes  SIGNED:  Kerney Elbe, DO [facility] 06/25/2019, 2:13 PM Pager is on [place]  If 7PM-7AM, please contact night-coverage [email or web address] Password [email or web address]

## 2019-06-25 NOTE — Progress Notes (Signed)
Vanc lvls were ordered prior to discharge to confirm therapy.  Unfortunately the peak was drawn during the infusion of the dose, so the peak of 29 is inaccurate. A trough was drawn this morning was 16  Since patient is due for dc today, will be unable to draw further levels.  Recommend for home health to check lvls as ordered  Barth Kirks, PharmD, BCPS, BCCCP Clinical Pharmacist [phone]  Please check AMION for all [place] numbers  06/25/2019 10:50 AM

## 2019-06-25 NOTE — Progress Notes (Signed)
Nsg Discharge Note  Admit Date:  06/20/2019 Discharge date: 06/25/2019   Shawna Lewis to be D/C'd home with home health per MD order.  AVS completed. Patient/caregiver able to verbalize understanding.  Discharge Medication: Allergies as of 06/25/2019      Reactions   Latex       Medication List    STOP taking these medications   diclofenac 75 MG EC tablet Commonly known as: VOLTAREN     TAKE these medications   aspirin EC 81 MG tablet Take 81 mg by mouth daily.   baclofen 20 MG tablet Commonly known as: LIORESAL Take 20 mg by mouth 3 (three) times daily.   cefTRIAXone  IVPB Commonly known as: ROCEPHIN Inject 2 g into the vein daily. Indication: Discitis  Last Day of Therapy:  08/03/19 Labs - Once weekly:  CBC/D and BMP, Labs - Every other week:  ESR and CRP   felodipine 2.5 MG 24 hr tablet Commonly known as: PLENDIL Take 2.5 mg by mouth daily.   fluvoxaMINE 100 MG tablet Commonly known as: LUVOX Take 100 mg by mouth at bedtime.   FUROSEMIDE PO Take by mouth daily as needed.   Oxycodone HCl 10 MG Tabs Take 10 mg by mouth every 4 (four) hours as needed.   PHENobarbital 64.8 MG tablet Commonly known as: LUMINAL Take 64.8 mg by mouth daily as needed. 1.5 tabs at onset of migraine as needed   pramipexole 0.25 MG tablet Commonly known as: MIRAPEX Take 0.25 mg by mouth daily.   vancomycin  IVPB Inject 1,250 mg into the vein every 12 (twelve) hours. Indication: Discitis  Last Day of Therapy:  08/03/2019  Labs - Sunday/Monday:  CBC/D, BMP, and vancomycin trough. Labs - Thursday:  BMP and vancomycin trough Labs - Every other week:  ESR and CRP            Home Infusion Instuctions  (From admission, onward)         Start     Ordered   06/23/19 0000  Home infusion instructions [facility] May follow [facility] Dosing Protocol; May administer Cathflo as needed to maintain patency of vascular access device.; Flushing of vascular access device: per  [facility] Protocol: 0.9% NaCl pre/post medica...    Question Answer Comment  Instructions May follow [facility] Dosing Protocol   Instructions May administer Cathflo as needed to maintain patency of vascular access device.   Instructions Flushing of vascular access device: per [facility] Protocol: 0.9% NaCl pre/post medication administration and prn patency; Heparin 100 u/ml, 5ml for implanted ports and Heparin 10u/ml, 5ml for all other central venous catheters.   Instructions May follow [facility] Anaphylaxis Protocol for First Dose Administration in the home: 0.9% NaCl at 25-50 ml/hr to maintain IV access for protocol meds. Epinephrine 0.3 ml IV/IM PRN and Benadryl 25-50 IV/IM PRN s/s of anaphylaxis.   Instructions [facility] Infusion Coordinator (RN) to assist per patient IV care needs in the home PRN.      12 /04/20 1523          Discharge Assessment: Vitals:   06/25/19 0846 06/25/19 1237  BP: (!) 143/65 (!) 144/63  Pulse: 79 81  Resp: 17 16  Temp: 99.5 F (37.5 C) 98.7 F (37.1 C)  SpO2: 97% 96%   Skin clean, dry and intact without evidence of skin break down, no evidence of skin tears noted. IV catheter discontinued intact. Site without signs and symptoms of complications - no redness or edema  noted at insertion site, patient denies c/o pain - only slight tenderness at site.  Dressing with slight pressure applied.  D/c Instructions-Education: Discharge instructions given to patient/family with verbalized understanding. D/c education completed with patient/family including follow up instructions, medication list, d/c activities limitations if indicated, with other d/c instructions as indicated by MD - patient able to verbalize understanding, all questions fully answered. Patient instructed to return to ED, call 911, or call MD for any changes in condition.  Patient escorted via [place], and D/C home via private auto.  Atilano Ina, RN 06/25/2019 3:23 PM

## 2019-06-25 NOTE — Progress Notes (Signed)
ID PROGRESS NOTE  Patient's vancomycin trough is at goal (not supratherapeutic),  Peak was not done appropriately   Recommend to discharge on vancomycin 1250mg  q12 plus ceftriaxone 2gm IV daily. We will have home health repeat labs and trough on Tuesday 12/8.  Okay to discharge from [facility]. [facility] [phone]

## 2019-06-25 NOTE — Care Management (Addendum)
Notified patient, nurse, [facility], and [facility] of the following from Dr Baxter Flattery:  Recommend to discharge on vancomycin 1250mg  q12 plus ceftriaxone 2gm IV daily. We will have home health repeat labs and trough on Tuesday 12/8.  PLAN- Patient to receive Ceftriaxone today at [facility]. Patient will get tonight's dose of Vanc at home, and start Ceftriaxone at home tomorrow.

## 2019-06-27 LAB — CULTURE, BLOOD (ROUTINE X 2)
Culture: NO GROWTH
Culture: NO GROWTH
Special Requests: ADEQUATE

## 2019-06-29 ENCOUNTER — Telehealth: Payer: Self-pay | Admitting: Infectious Diseases

## 2019-06-29 ENCOUNTER — Other Ambulatory Visit (HOSPITAL_COMMUNITY)
Admission: RE | Admit: 2019-06-29 | Discharge: 2019-06-29 | Disposition: A | Discharge status: Home / Self Care | Payer: Medicare Other | Source: Ambulatory Visit | Attending: Infectious Diseases | Admitting: Infectious Diseases

## 2019-06-29 DIAGNOSIS — Z5181 Encounter for therapeutic drug level monitoring: Secondary | ICD-10-CM | POA: Insufficient documentation

## 2019-06-29 LAB — BASIC METABOLIC PANEL
Anion gap: 10 (ref 5–15)
BUN: 12 mg/dL (ref 8–23)
CO2: 28 mmol/L (ref 22–32)
Calcium: 9.3 mg/dL (ref 8.9–10.3)
Chloride: 104 mmol/L (ref 98–111)
Creatinine, Ser: 0.6 mg/dL (ref 0.44–1.00)
GFR calc Af Amer: >60 mL/min (ref >60)
GFR calc non Af Amer: >60 mL/min (ref >60)
Glucose, Bld: 109 mg/dL — ABNORMAL HIGH (ref 70–99)
Potassium: 3.7 mmol/L (ref 3.5–5.1)
Sodium: 142 mmol/L (ref 135–145)

## 2019-06-29 LAB — VANCOMYCIN, TROUGH: Vancomycin Tr: 27 ug/mL (ref 15–20)

## 2019-06-29 NOTE — Telephone Encounter (Signed)
Called to discuss with Jeani Hawking at [facility] team to inform of lab values Vanc trough 27 and Creatinine 0.60. 4 days ago the trough was 16. Suspect that this may be a timing of the draw vs last dose given she has had no change in her kidney function. He will call the patient to investigate further and call back to update.

## 2019-06-30 ENCOUNTER — Telehealth (INDEPENDENT_AMBULATORY_CARE_PROVIDER_SITE_OTHER): Payer: Medicare Other | Admitting: Infectious Diseases

## 2019-06-30 ENCOUNTER — Encounter: Payer: Self-pay | Admitting: Infectious Diseases

## 2019-06-30 ENCOUNTER — Other Ambulatory Visit: Payer: Self-pay

## 2019-06-30 DIAGNOSIS — M869 Osteomyelitis, unspecified: Secondary | ICD-10-CM

## 2019-06-30 DIAGNOSIS — M5416 Radiculopathy, lumbar region: Secondary | ICD-10-CM | POA: Diagnosis not present

## 2019-06-30 DIAGNOSIS — Z5181 Encounter for therapeutic drug level monitoring: Secondary | ICD-10-CM | POA: Diagnosis not present

## 2019-06-30 NOTE — Progress Notes (Signed)
Name: Shawna Lewis, female DOB: Sep 11, 1950  MRN: [id number]   CC:  Hospital follow up L2-L3 osteomyelitis and discitis   Virtual Visit via Video Note  I connected with [name] on 06/30/19 at  9:45 AM EST by a video enabled telemedicine application and verified that I am speaking with the correct person using two identifiers.  Location: Patient: home Provider: [facility]   I discussed the limitations of evaluation and management by telemedicine and the availability of in person appointments. The patient expressed understanding and agreed to proceed.   SUBJECTIVE: Brief Narrative: Shawna Lewis is a 68 y.o. 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 Micro data from aspiration was with only few WBCs and no organisms on gram stain and no growth @ 24 hours. All 4 sets of blood cultures are thusfar negative as well 72 and 48 hours respectively.    HPI: Since discharge she has been recovering OK. She has had more hip pain lately since she cannot take her antiinflammatories as scheduled due to co-administration with Vancomycin. She was feeling poor yesterday that she thinks may have been due to her vancomycin levels being elevated; today she feels better. She has been doing some things around the house but overall has been trying to rest. Some upset stomach with the antibiotics but no watery diarrhea or abdominal cramping/pain/bloating. She is eating yogurt to help prevent yeast infections so far with good effect.   PICC line is without pain, drainage or erythema and is well maintained by [facility]. No swelling or altered sensation in affected distal extremity.   Work up for her discitis included panorex which was normal and TTE which did not reveal any vegetations. She had no active wounds on her skin. No IV drug use.   Observations/Objective: Physical Exam Constitutional:      General: She is not in acute distress.    Appearance: Normal appearance. She is not ill-appearing.  HENT:     Head: Normocephalic.     Nose: Nose normal.     Mouth/Throat:     Mouth: Mucous membranes are moist.     Pharynx: Oropharynx is clear.  Eyes:     General: No scleral icterus. Musculoskeletal:     Cervical back: Normal range of motion.     Comments: Seated comfortably on the couch. Unable to appreciate gait or ROM during encounter.   Neurological:     Mental Status: She is alert and oriented to person, place, and time.  Psychiatric:        Mood and Affect: Mood normal.        Behavior: Behavior normal.        Thought Content: Thought content normal.        Judgment: Judgment normal.      Assessment and Plan: Problem List Items Addressed This Visit      Unprioritized   Therapeutic drug monitoring    Elevated vancomycin trough recently with normal Cr. Dose held x 1 and resumed at 1g BID. She is integrated to Home Health with [facility] protocol.   Baseline pre-treatment inflammatory markers were not elevated.       Osteomyelitis (HCC)    Complicated by epidural phlegmon at L2-3 with discitis.  No growth from blood or aspiration cultures, final. She has been doing well on empiric regimen of vancomycin and ceftriaxone early into her therapy. End of therapy  projected to be January 14th 2021. Will arrange another E-visit near the end of her therapy with myself or Dr. Baxter Flattery.        Chronic left lumbar radiculopathy (Chronic)    She has been unable to take oral anti-inflammatories with concurrent vancomycin. I suggested to try diclofenac gel twice a day to her hip to see if this helps. Unlikely to cause significant impact to kidney function given that it is topical. She asks about tumeric - I do not see any interactions with her IV antibiotics nor her other regular maintenance medications. OK to use.            Follow Up Instructions: 4 weeks   I  discussed the assessment and treatment plan with the patient. The patient was provided an opportunity to ask questions and all were answered. The patient agreed with the plan and demonstrated an understanding of the instructions.   The patient was advised to call back or seek an in-person evaluation if the symptoms worsen or if the condition fails to improve as anticipated.  I provided 15 minutes of non-face-to-face time during this encounter.   Janene Madeira, NP

## 2019-06-30 NOTE — Assessment & Plan Note (Addendum)
Complicated by epidural phlegmon at L2-3 with discitis.  No growth from blood or aspiration cultures, final. She has been doing well on empiric regimen of vancomycin and ceftriaxone early into her therapy. End of therapy projected to be January 14th 2021. Will arrange another E-visit near the end of her therapy with myself or Dr. Baxter Flattery.

## 2019-06-30 NOTE — Telephone Encounter (Signed)
I touch base with [facility] again today.  1 dose of vancomycin to be held with decrease in maintenance dosing of 1 g twice daily to start today.

## 2019-06-30 NOTE — Assessment & Plan Note (Signed)
She has been unable to take oral anti-inflammatories with concurrent vancomycin. I suggested to try diclofenac gel twice a day to her hip to see if this helps. Unlikely to cause significant impact to kidney function given that it is topical. She asks about tumeric - I do not see any interactions with her IV antibiotics nor her other regular maintenance medications. OK to use.

## 2019-06-30 NOTE — Assessment & Plan Note (Signed)
Elevated vancomycin trough recently with normal Cr. Dose held x 1 and resumed at 1g BID. She is integrated to [facility] with [facility] protocol.   Baseline pre-treatment inflammatory markers were not elevated.

## 2019-07-06 ENCOUNTER — Other Ambulatory Visit (HOSPITAL_COMMUNITY)
Admission: RE | Admit: 2019-07-06 | Discharge: 2019-07-06 | Disposition: A | Discharge status: Home / Self Care | Payer: Medicare Other | Source: Ambulatory Visit | Attending: Internal Medicine | Admitting: Internal Medicine

## 2019-07-06 DIAGNOSIS — M4626 Osteomyelitis of vertebra, lumbar region: Secondary | ICD-10-CM | POA: Diagnosis present

## 2019-07-06 LAB — BASIC METABOLIC PANEL
Anion gap: 11 (ref 5–15)
BUN: 9 mg/dL (ref 8–23)
CO2: 30 mmol/L (ref 22–32)
Calcium: 8.5 mg/dL — ABNORMAL LOW (ref 8.9–10.3)
Chloride: 98 mmol/L (ref 98–111)
Creatinine, Ser: 0.51 mg/dL (ref 0.44–1.00)
GFR calc Af Amer: >60 mL/min (ref >60)
GFR calc non Af Amer: >60 mL/min (ref >60)
Glucose, Bld: 125 mg/dL — ABNORMAL HIGH (ref 70–99)
Potassium: 3.4 mmol/L — ABNORMAL LOW (ref 3.5–5.1)
Sodium: 139 mmol/L (ref 135–145)

## 2019-07-06 LAB — VANCOMYCIN, TROUGH: Vancomycin Tr: 15 ug/mL (ref 15–20)

## 2019-07-10 ENCOUNTER — Other Ambulatory Visit (HOSPITAL_COMMUNITY)
Admission: RE | Admit: 2019-07-10 | Discharge: 2019-07-10 | Disposition: A | Discharge status: Home / Self Care | Payer: Medicare Other | Source: Ambulatory Visit | Attending: Internal Medicine | Admitting: Internal Medicine

## 2019-07-10 DIAGNOSIS — M4626 Osteomyelitis of vertebra, lumbar region: Secondary | ICD-10-CM | POA: Diagnosis present

## 2019-07-10 LAB — CBC WITH DIFFERENTIAL/PLATELET
Abs Immature Granulocytes: 0.01 10*3/uL (ref 0.00–0.07)
Basophils Absolute: 0.1 10*3/uL (ref 0.0–0.1)
Basophils Relative: 2 %
Eosinophils Absolute: 0.2 10*3/uL (ref 0.0–0.5)
Eosinophils Relative: 7 %
HCT: 29.7 % — ABNORMAL LOW (ref 36.0–46.0)
Hemoglobin: 9.2 g/dL — ABNORMAL LOW (ref 12.0–15.0)
Immature Granulocytes: 0 %
Lymphocytes Relative: 36 %
Lymphs Abs: 1.1 10*3/uL (ref 0.7–4.0)
MCH: 28 pg (ref 26.0–34.0)
MCHC: 31 g/dL (ref 30.0–36.0)
MCV: 90.3 fL (ref 80.0–100.0)
Monocytes Absolute: 0.5 10*3/uL (ref 0.1–1.0)
Monocytes Relative: 16 %
Neutro Abs: 1.3 10*3/uL — ABNORMAL LOW (ref 1.7–7.7)
Neutrophils Relative %: 39 %
Platelets: 159 10*3/uL (ref 150–400)
RBC: 3.29 MIL/uL — ABNORMAL LOW (ref 3.87–5.11)
RDW: 14.6 % (ref 11.5–15.5)
WBC: 3.2 10*3/uL — ABNORMAL LOW (ref 4.0–10.5)
nRBC: 0 % (ref 0.0–0.2)

## 2019-07-10 LAB — BASIC METABOLIC PANEL
Anion gap: 11 (ref 5–15)
BUN: 12 mg/dL (ref 8–23)
CO2: 28 mmol/L (ref 22–32)
Calcium: 8.9 mg/dL (ref 8.9–10.3)
Chloride: 102 mmol/L (ref 98–111)
Creatinine, Ser: 0.54 mg/dL (ref 0.44–1.00)
GFR calc Af Amer: >60 mL/min (ref >60)
GFR calc non Af Amer: >60 mL/min (ref >60)
Glucose, Bld: 119 mg/dL — ABNORMAL HIGH (ref 70–99)
Potassium: 4.1 mmol/L (ref 3.5–5.1)
Sodium: 141 mmol/L (ref 135–145)

## 2019-07-10 LAB — VANCOMYCIN, TROUGH: Vancomycin Tr: 23 ug/mL (ref 15–20)

## 2019-07-12 ENCOUNTER — Other Ambulatory Visit (HOSPITAL_COMMUNITY)
Admission: RE | Admit: 2019-07-12 | Discharge: 2019-07-12 | Disposition: A | Discharge status: Home / Self Care | Payer: Medicare Other | Source: Ambulatory Visit | Attending: Internal Medicine | Admitting: Internal Medicine

## 2019-07-12 DIAGNOSIS — M4626 Osteomyelitis of vertebra, lumbar region: Secondary | ICD-10-CM | POA: Insufficient documentation

## 2019-07-12 LAB — BASIC METABOLIC PANEL
Anion gap: 7 (ref 5–15)
BUN: 10 mg/dL (ref 8–23)
CO2: 29 mmol/L (ref 22–32)
Calcium: 8.8 mg/dL — ABNORMAL LOW (ref 8.9–10.3)
Chloride: 101 mmol/L (ref 98–111)
Creatinine, Ser: 0.56 mg/dL (ref 0.44–1.00)
GFR calc Af Amer: >60 mL/min (ref >60)
GFR calc non Af Amer: >60 mL/min (ref >60)
Glucose, Bld: 164 mg/dL — ABNORMAL HIGH (ref 70–99)
Potassium: 3.8 mmol/L (ref 3.5–5.1)
Sodium: 137 mmol/L (ref 135–145)

## 2019-07-12 LAB — VANCOMYCIN, TROUGH: Vancomycin Tr: 14 ug/mL — ABNORMAL LOW (ref 15–20)

## 2019-07-17 ENCOUNTER — Encounter (HOSPITAL_COMMUNITY)
Admission: RE | Admit: 2019-07-17 | Discharge: 2019-07-17 | Disposition: A | Discharge status: Home / Self Care | Payer: Medicare Other | Source: Skilled Nursing Facility | Attending: Internal Medicine | Admitting: Internal Medicine

## 2019-07-17 DIAGNOSIS — M4626 Osteomyelitis of vertebra, lumbar region: Secondary | ICD-10-CM | POA: Diagnosis present

## 2019-07-17 LAB — CBC WITH DIFFERENTIAL/PLATELET
Abs Immature Granulocytes: 0.01 10*3/uL (ref 0.00–0.07)
Basophils Absolute: 0.1 10*3/uL (ref 0.0–0.1)
Basophils Relative: 2 %
Eosinophils Absolute: 0.4 10*3/uL (ref 0.0–0.5)
Eosinophils Relative: 8 %
HCT: 30.2 % — ABNORMAL LOW (ref 36.0–46.0)
Hemoglobin: 9.3 g/dL — ABNORMAL LOW (ref 12.0–15.0)
Immature Granulocytes: 0 %
Lymphocytes Relative: 28 %
Lymphs Abs: 1.3 10*3/uL (ref 0.7–4.0)
MCH: 27.5 pg (ref 26.0–34.0)
MCHC: 30.8 g/dL (ref 30.0–36.0)
MCV: 89.3 fL (ref 80.0–100.0)
Monocytes Absolute: 0.2 10*3/uL (ref 0.1–1.0)
Monocytes Relative: 4 %
Neutro Abs: 2.8 10*3/uL (ref 1.7–7.7)
Neutrophils Relative %: 58 %
Platelets: 361 10*3/uL (ref 150–400)
RBC: 3.38 MIL/uL — ABNORMAL LOW (ref 3.87–5.11)
RDW: 14.3 % (ref 11.5–15.5)
WBC: 4.8 10*3/uL (ref 4.0–10.5)
nRBC: 0 % (ref 0.0–0.2)

## 2019-07-17 LAB — VANCOMYCIN, TROUGH: Vancomycin Tr: 20 ug/mL (ref 15–20)

## 2019-07-17 LAB — BASIC METABOLIC PANEL
Anion gap: 10 (ref 5–15)
BUN: 10 mg/dL (ref 8–23)
CO2: 28 mmol/L (ref 22–32)
Calcium: 9.1 mg/dL (ref 8.9–10.3)
Chloride: 102 mmol/L (ref 98–111)
Creatinine, Ser: 0.51 mg/dL (ref 0.44–1.00)
GFR calc Af Amer: >60 mL/min (ref >60)
GFR calc non Af Amer: >60 mL/min (ref >60)
Glucose, Bld: 111 mg/dL — ABNORMAL HIGH (ref 70–99)
Potassium: 3.8 mmol/L (ref 3.5–5.1)
Sodium: 140 mmol/L (ref 135–145)

## 2019-07-17 LAB — SEDIMENTATION RATE: Sed Rate: 34 mm/hr — ABNORMAL HIGH (ref 0–22)

## 2019-07-17 LAB — C-REACTIVE PROTEIN: CRP: 0.7 mg/dL (ref <1.0)

## 2019-07-19 ENCOUNTER — Other Ambulatory Visit (HOSPITAL_COMMUNITY)
Admission: RE | Admit: 2019-07-19 | Discharge: 2019-07-19 | Disposition: A | Discharge status: Home / Self Care | Payer: Medicare Other | Source: Ambulatory Visit | Attending: Internal Medicine | Admitting: Internal Medicine

## 2019-07-19 DIAGNOSIS — M4626 Osteomyelitis of vertebra, lumbar region: Secondary | ICD-10-CM | POA: Diagnosis present

## 2019-07-19 LAB — BASIC METABOLIC PANEL
Anion gap: 11 (ref 5–15)
BUN: 14 mg/dL (ref 8–23)
CO2: 27 mmol/L (ref 22–32)
Calcium: 9.1 mg/dL (ref 8.9–10.3)
Chloride: 102 mmol/L (ref 98–111)
Creatinine, Ser: 0.6 mg/dL (ref 0.44–1.00)
GFR calc Af Amer: >60 mL/min (ref >60)
GFR calc non Af Amer: >60 mL/min (ref >60)
Glucose, Bld: 107 mg/dL — ABNORMAL HIGH (ref 70–99)
Potassium: 3.6 mmol/L (ref 3.5–5.1)
Sodium: 140 mmol/L (ref 135–145)

## 2019-07-19 LAB — VANCOMYCIN, TROUGH: Vancomycin Tr: 22 ug/mL (ref 15–20)

## 2019-07-24 ENCOUNTER — Telehealth: Payer: Self-pay | Admitting: *Deleted

## 2019-07-24 ENCOUNTER — Telehealth: Payer: Self-pay

## 2019-07-24 ENCOUNTER — Other Ambulatory Visit (HOSPITAL_COMMUNITY)
Admission: RE | Admit: 2019-07-24 | Discharge: 2019-07-24 | Disposition: A | Discharge status: Home / Self Care | Payer: Medicare PPO | Source: Ambulatory Visit | Attending: Internal Medicine | Admitting: Internal Medicine

## 2019-07-24 DIAGNOSIS — M4626 Osteomyelitis of vertebra, lumbar region: Secondary | ICD-10-CM | POA: Diagnosis present

## 2019-07-24 LAB — CBC WITH DIFFERENTIAL/PLATELET
Abs Immature Granulocytes: 0 10*3/uL (ref 0.00–0.07)
Basophils Absolute: 0 10*3/uL (ref 0.0–0.1)
Basophils Relative: 0 %
Eosinophils Absolute: 0.4 10*3/uL (ref 0.0–0.5)
Eosinophils Relative: 18 %
HCT: 28.7 % — ABNORMAL LOW (ref 36.0–46.0)
Hemoglobin: 9 g/dL — ABNORMAL LOW (ref 12.0–15.0)
Immature Granulocytes: 0 %
Lymphocytes Relative: 37 %
Lymphs Abs: 0.9 10*3/uL (ref 0.7–4.0)
MCH: 27.8 pg (ref 26.0–34.0)
MCHC: 31.4 g/dL (ref 30.0–36.0)
MCV: 88.6 fL (ref 80.0–100.0)
Monocytes Absolute: 0.1 10*3/uL (ref 0.1–1.0)
Monocytes Relative: 4 %
Neutro Abs: 0.9 10*3/uL — ABNORMAL LOW (ref 1.7–7.7)
Neutrophils Relative %: 41 %
Platelets: 143 10*3/uL — ABNORMAL LOW (ref 150–400)
RBC: 3.24 MIL/uL — ABNORMAL LOW (ref 3.87–5.11)
RDW: 14.8 % (ref 11.5–15.5)
WBC: 2.3 10*3/uL — ABNORMAL LOW (ref 4.0–10.5)
nRBC: 0 % (ref 0.0–0.2)

## 2019-07-24 LAB — BASIC METABOLIC PANEL
Anion gap: 8 (ref 5–15)
BUN: 14 mg/dL (ref 8–23)
CO2: 30 mmol/L (ref 22–32)
Calcium: 9.3 mg/dL (ref 8.9–10.3)
Chloride: 102 mmol/L (ref 98–111)
Creatinine, Ser: 0.58 mg/dL (ref 0.44–1.00)
GFR calc Af Amer: >60 mL/min (ref >60)
GFR calc non Af Amer: >60 mL/min (ref >60)
Glucose, Bld: 110 mg/dL — ABNORMAL HIGH (ref 70–99)
Potassium: 3.5 mmol/L (ref 3.5–5.1)
Sodium: 140 mmol/L (ref 135–145)

## 2019-07-24 LAB — VANCOMYCIN, TROUGH: Vancomycin Tr: 16 ug/mL (ref 15–20)

## 2019-07-24 NOTE — Telephone Encounter (Signed)
Lanora Manis from [facility] called to advise that the patient PCP Dr Juanetta Gosling has retired and she wants to know if Shawna Lewis will sign orders for PICC care and IV meds going forward. Advised will ask provider and give her a call back.

## 2019-07-24 NOTE — Telephone Encounter (Signed)
Larita Fife called office to follow on message from earlier. [facility] will dispense a couple of days of medication to avoid missing doses. Lorenso Courier, [place]

## 2019-07-24 NOTE — Telephone Encounter (Signed)
Received call today from [facility] with critical lab results. Patient's WBC was 2.3 on 1/4. Last count was 4.8 on 12/28.  Home health would like for office to advise if changes need to be made with current dose of Vancomycin. Will forward message to Rexene Alberts, NP. Lorenso Courier, [place]

## 2019-07-24 NOTE — Telephone Encounter (Signed)
I called to speak with Larita Fife at [facility] re: leukopenia noted on labs today - last count was 4.8  He will look into daptomycin as viable option to complete with ceftriaxone on presumption the vancomycin is the offending agent causing drop in WBC counts. Will include Cassie as well.

## 2019-07-24 NOTE — Telephone Encounter (Signed)
Called Shawna Lewis back to advise her that Judeth Cornfield or Drue Second will sign those orders for duration of IV therapy. Shawna Lewis advised she will send them soon. And advised to thank the providers.

## 2019-07-24 NOTE — Telephone Encounter (Signed)
Of course - happy to help. I believe she also has an appointment with Dr. Drue Second soon so hopefully she is nearly done with her course of antibiotics.   Thank you Feliz Beam

## 2019-07-25 NOTE — Telephone Encounter (Signed)
Yes please arrange first dose through short stay with dose of daptomycin being 750 mg Q24h IV through January 14th.    Would hold further vancomycin doses and start dapto tomorrow if it can be shipped/arranged. Let me know if you have trouble getting asap appt for short stay - I may call in a P.O. option to bridge her.   Please have them add on weekly CK levels and discontinue Vancomycin levels. Can reduce CMP to weekly.   Would ask to continue twice weekly CBCs for now until we see some improvement.   Tammy Sours do you mind signing for me?

## 2019-07-25 NOTE — Telephone Encounter (Signed)
Per Judeth Cornfield and due to change of medication call the patient to advise she will have to have her first dose of the new antibiotic at the Short Stay at [facility] and we will have to schedule that first thing in the morning as they are closed right now. Advised her Marcelino Duster will call her with a time and she can go anytime tomorrow 07-26-19.

## 2019-07-25 NOTE — Telephone Encounter (Signed)
Larita Fife called to advise the Dapto is covered by her insurance but because she has never had this medication they can not give first dose in the home. Advised we will need to schedule patient at short stay for this and I will let Judeth Cornfield know and get an order for this.

## 2019-07-25 NOTE — Telephone Encounter (Signed)
Called Lynn at [facility] to let him know to stop the Vanc troughs, add a CK, drop the CMP to once a week and to continue the CBC 2x weekly until she shows improvement. Also advised once we get the Dapto dose scheduled someone will give him a call so they can ship medication.

## 2019-07-26 ENCOUNTER — Ambulatory Visit (HOSPITAL_COMMUNITY)
Admission: RE | Admit: 2019-07-26 | Discharge: 2019-07-26 | Disposition: A | Discharge status: Home / Self Care | Payer: Medicare PPO | Source: Ambulatory Visit | Attending: Infectious Diseases | Admitting: Infectious Diseases

## 2019-07-26 ENCOUNTER — Other Ambulatory Visit: Payer: Self-pay

## 2019-07-26 DIAGNOSIS — M869 Osteomyelitis, unspecified: Secondary | ICD-10-CM | POA: Insufficient documentation

## 2019-07-26 MED ORDER — SODIUM CHLORIDE 0.9 % IV SOLN
750.0000 mg | Freq: Once | INTRAVENOUS | Status: AC
  Administered 2019-07-26: 750 mg via INTRAVENOUS
  Filled 2019-07-26: qty 15

## 2019-07-26 MED ORDER — HEPARIN SOD (PORK) LOCK FLUSH 100 UNIT/ML IV SOLN: 250.0000 [IU] | INTRAVENOUS | Status: DC | PRN

## 2019-07-26 MED ORDER — HEPARIN SOD (PORK) LOCK FLUSH 100 UNIT/ML IV SOLN: 250.0000 [IU] | Freq: Every day | INTRAVENOUS | Status: DC

## 2019-07-26 NOTE — Telephone Encounter (Signed)
Patient scheduled (and arrived) for daptomycin infusion 1/6 10:00 at [facility].  RN notified Debbie at [facility] this morning. Andree Coss, RN

## 2019-07-27 ENCOUNTER — Other Ambulatory Visit (HOSPITAL_COMMUNITY)
Admission: RE | Admit: 2019-07-27 | Discharge: 2019-07-27 | Disposition: A | Discharge status: Home / Self Care | Payer: Medicare PPO | Source: Other Acute Inpatient Hospital | Attending: Internal Medicine | Admitting: Internal Medicine

## 2019-07-27 DIAGNOSIS — M4626 Osteomyelitis of vertebra, lumbar region: Secondary | ICD-10-CM | POA: Insufficient documentation

## 2019-07-27 LAB — CBC WITH DIFFERENTIAL/PLATELET
Abs Immature Granulocytes: 0 10*3/uL (ref 0.00–0.07)
Basophils Absolute: 0 10*3/uL (ref 0.0–0.1)
Basophils Relative: 1 %
Eosinophils Absolute: 0.4 10*3/uL (ref 0.0–0.5)
Eosinophils Relative: 19 %
HCT: 29.9 % — ABNORMAL LOW (ref 36.0–46.0)
Hemoglobin: 9.4 g/dL — ABNORMAL LOW (ref 12.0–15.0)
Immature Granulocytes: 0 %
Lymphocytes Relative: 64 %
Lymphs Abs: 1.1 10*3/uL (ref 0.7–4.0)
MCH: 28.2 pg (ref 26.0–34.0)
MCHC: 31.4 g/dL (ref 30.0–36.0)
MCV: 89.8 fL (ref 80.0–100.0)
Monocytes Absolute: 0.1 10*3/uL (ref 0.1–1.0)
Monocytes Relative: 6 %
Neutro Abs: 0.2 10*3/uL — ABNORMAL LOW (ref 1.7–7.7)
Neutrophils Relative %: 10 %
Platelets: 97 10*3/uL — ABNORMAL LOW (ref 150–400)
RBC: 3.33 MIL/uL — ABNORMAL LOW (ref 3.87–5.11)
RDW: 15.2 % (ref 11.5–15.5)
WBC: 1.8 10*3/uL — ABNORMAL LOW (ref 4.0–10.5)
nRBC: 0 % (ref 0.0–0.2)

## 2019-07-31 ENCOUNTER — Other Ambulatory Visit (HOSPITAL_COMMUNITY)
Admission: RE | Admit: 2019-07-31 | Discharge: 2019-07-31 | Disposition: A | Discharge status: Home / Self Care | Payer: Medicare PPO | Source: Ambulatory Visit | Attending: Internal Medicine | Admitting: Internal Medicine

## 2019-07-31 ENCOUNTER — Telehealth: Payer: Self-pay | Admitting: *Deleted

## 2019-07-31 DIAGNOSIS — M4626 Osteomyelitis of vertebra, lumbar region: Secondary | ICD-10-CM | POA: Diagnosis present

## 2019-07-31 LAB — CBC WITH DIFFERENTIAL/PLATELET
Abs Immature Granulocytes: 0 10*3/uL (ref 0.00–0.07)
Basophils Absolute: 0 10*3/uL (ref 0.0–0.1)
Basophils Relative: 1 %
Eosinophils Absolute: 0.2 10*3/uL (ref 0.0–0.5)
Eosinophils Relative: 9 %
HCT: 29.5 % — ABNORMAL LOW (ref 36.0–46.0)
Hemoglobin: 9.2 g/dL — ABNORMAL LOW (ref 12.0–15.0)
Immature Granulocytes: 0 %
Lymphocytes Relative: 71 %
Lymphs Abs: 1.2 10*3/uL (ref 0.7–4.0)
MCH: 28.1 pg (ref 26.0–34.0)
MCHC: 31.2 g/dL (ref 30.0–36.0)
MCV: 90.2 fL (ref 80.0–100.0)
Monocytes Absolute: 0.2 10*3/uL (ref 0.1–1.0)
Monocytes Relative: 10 %
Neutro Abs: 0.2 10*3/uL — ABNORMAL LOW (ref 1.7–7.7)
Neutrophils Relative %: 9 %
Platelets: 94 10*3/uL — ABNORMAL LOW (ref 150–400)
RBC: 3.27 MIL/uL — ABNORMAL LOW (ref 3.87–5.11)
RDW: 15.7 % — ABNORMAL HIGH (ref 11.5–15.5)
WBC: 1.7 10*3/uL — ABNORMAL LOW (ref 4.0–10.5)
nRBC: 0 % (ref 0.0–0.2)

## 2019-07-31 LAB — COMPREHENSIVE METABOLIC PANEL
ALT: 18 U/L (ref 0–44)
AST: 20 U/L (ref 15–41)
Albumin: 4.1 g/dL (ref 3.5–5.0)
Alkaline Phosphatase: 59 U/L (ref 38–126)
Anion gap: 10 (ref 5–15)
BUN: 12 mg/dL (ref 8–23)
CO2: 30 mmol/L (ref 22–32)
Calcium: 9 mg/dL (ref 8.9–10.3)
Chloride: 102 mmol/L (ref 98–111)
Creatinine, Ser: 0.53 mg/dL (ref 0.44–1.00)
GFR calc Af Amer: >60 mL/min (ref >60)
GFR calc non Af Amer: >60 mL/min (ref >60)
Glucose, Bld: 110 mg/dL — ABNORMAL HIGH (ref 70–99)
Potassium: 3.8 mmol/L (ref 3.5–5.1)
Sodium: 142 mmol/L (ref 135–145)
Total Bilirubin: 0.4 mg/dL (ref 0.3–1.2)
Total Protein: 6.8 g/dL (ref 6.5–8.1)

## 2019-07-31 LAB — CK: Total CK: 42 U/L (ref 38–234)

## 2019-07-31 NOTE — Telephone Encounter (Signed)
Patient called 1) to see if her labs were better and 2) to see when she might expect improvement. Her lab results are about the same 1 week after stopping vancomycin.  She is scheduled to follow up with Dr Drue Second 1/13 at 10:45. Andree Coss, RN

## 2019-08-02 ENCOUNTER — Encounter: Payer: Self-pay | Admitting: Internal Medicine

## 2019-08-02 ENCOUNTER — Telehealth (INDEPENDENT_AMBULATORY_CARE_PROVIDER_SITE_OTHER): Payer: Medicare PPO | Admitting: Internal Medicine

## 2019-08-02 ENCOUNTER — Other Ambulatory Visit: Payer: Self-pay

## 2019-08-02 DIAGNOSIS — M4626 Osteomyelitis of vertebra, lumbar region: Secondary | ICD-10-CM

## 2019-08-02 DIAGNOSIS — D702 Other drug-induced agranulocytosis: Secondary | ICD-10-CM

## 2019-08-02 DIAGNOSIS — Z5181 Encounter for therapeutic drug level monitoring: Secondary | ICD-10-CM | POA: Diagnosis not present

## 2019-08-02 NOTE — Progress Notes (Signed)
RFV: follow up for lumbar discitis -video visit  Patient ID: Shawna Lewis, female   DOB: 1951-03-23, 69 y.o.   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 She has stopped taking vancomycin and has upcoming lab work to see that her symptoms are improving. She feels that her back pain is improved.   Outpatient Encounter Medications as of 08/02/2019  Medication Sig  . aspirin EC 81 MG tablet Take 81 mg by mouth daily.  . baclofen (LIORESAL) 20 MG tablet Take 20 mg by mouth 3 (three) times daily.  . cefTRIAXone (ROCEPHIN) 10 g injection   . cefTRIAXone (ROCEPHIN) IVPB Inject 2 g into the vein daily. Indication: Discitis  Last Day of Therapy:  08/03/19 Labs - Once weekly:  CBC/D and BMP, Labs - Every other week:  ESR and CRP  . DAPTOmycin (CUBICIN) 500 MG injection   . felodipine (PLENDIL) 2.5 MG 24 hr tablet Take 2.5 mg by mouth daily.   . fluvoxaMINE (LUVOX) 100 MG tablet Take 100 mg by mouth at bedtime.   . FUROSEMIDE PO Take by mouth daily as needed.  . Heparin Lock Flush (HEPARIN FLUSH) 10 UNIT/ML SOLN injection   . Oxycodone HCl 10 MG TABS Take 10 mg by mouth every 4 (four) hours as needed.   Marland Kitchen PHENobarbital (LUMINAL) 64.8 MG tablet Take 64.8 mg by mouth daily as needed. 1.5 tabs at onset of migraine as needed  . pramipexole (MIRAPEX) 0.25 MG tablet Take 0.25 mg by mouth daily.  . vancomycin (VANCOCIN) 10 G SOLR injection   . vancomycin IVPB Inject 1,250 mg into the vein every 12 (twelve) hours. Indication: Discitis  Last Day of Therapy:  08/03/2019  Labs - Sunday/Monday:  CBC/D, BMP, and vancomycin trough. Labs - Thursday:  BMP and vancomycin trough Labs - Every other week:  ESR and CRP   No facility-administered encounter medications on file as of 08/02/2019.     Patient Active  Problem List   Diagnosis Date Noted  . Therapeutic drug monitoring 06/30/2019  . Chronic left lumbar radiculopathy 06/21/2019  . Diskitis 06/20/2019  . Osteomyelitis ([place]) 06/20/2019  . HTN (hypertension) 06/20/2019  . DDD (degenerative disc disease), lumbar   . Migraine   . Chronic low back pain   . Central stenosis of spinal canal      Health Maintenance Due  Topic Date Due  . Hepatitis C Screening  January 21, 1951  . TETANUS/TDAP  12/29/1971  . COLONOSCOPY  12/29/2002  . MAMMOGRAM  05/19/2017  . DEXA SCAN  12/28/2017  . PNA vac Low Risk Adult (1 of 2 - PCV13) 12/28/2017  . INFLUENZA VACCINE  02/18/2019    Social History   Tobacco Use  . Smoking status: Former Research scientist (life sciences)  . Smokeless tobacco: Never Used  Substance Use Topics  . Alcohol use: Yes    Comment: 1-2 times per yr  . Drug use: No   Review of Systems Back pain + but improved, 12 point ros is otherwise negative Physical Exam   There were no vitals taken for this visit.  gen = comfortable sitting on cough in NAD, A x O By 4 HEENT = NCAT/PERRLA, EOMI, oral mucosa no signs of thrush Skin = picc line is c/d/i. No rash  CBC Lab Results  Component Value Date   WBC 1.7 (  L) 07/31/2019   RBC 3.27 (L) 07/31/2019   HGB 9.2 (L) 07/31/2019   HCT 29.5 (L) 07/31/2019   PLT 94 (L) 07/31/2019   MCV 90.2 07/31/2019   MCH 28.1 07/31/2019   MCHC 31.2 07/31/2019   RDW 15.7 (H) 07/31/2019   LYMPHSABS 1.2 07/31/2019   MONOABS 0.2 07/31/2019   EOSABS 0.2 07/31/2019    BMET Lab Results  Component Value Date   NA 142 07/31/2019   K 3.8 07/31/2019   CL 102 07/31/2019   CO2 30 07/31/2019   GLUCOSE 110 (H) 07/31/2019   BUN 12 07/31/2019   CREATININE 0.53 07/31/2019   CALCIUM 9.0 07/31/2019   GFRNONAA >60 07/31/2019   GFRAA >60 07/31/2019      Assessment and Plan vanco induced leukopenia = Will have home health rn come Monday for cbc with diff. Sed rate and crp Call pharmacy to replace one dose since it is  unlabeled  Back pain = Okay - diclofenac and tumeric  Osteomyelitis/discitis = plan to transition to doxycycline 181m bid to start next week to take on full stomach at completion of iv abtx  covid vaccine recommended  rtc in 4 wk video visit

## 2019-08-03 ENCOUNTER — Other Ambulatory Visit (HOSPITAL_COMMUNITY)
Admission: RE | Admit: 2019-08-03 | Discharge: 2019-08-03 | Disposition: A | Discharge status: Home / Self Care | Payer: Medicare PPO | Source: Ambulatory Visit | Attending: Internal Medicine | Admitting: Internal Medicine

## 2019-08-03 DIAGNOSIS — M4626 Osteomyelitis of vertebra, lumbar region: Secondary | ICD-10-CM | POA: Diagnosis present

## 2019-08-03 LAB — CBC WITH DIFFERENTIAL/PLATELET
Abs Immature Granulocytes: 0 10*3/uL (ref 0.00–0.07)
Basophils Absolute: 0 10*3/uL (ref 0.0–0.1)
Basophils Relative: 1 %
Eosinophils Absolute: 0.1 10*3/uL (ref 0.0–0.5)
Eosinophils Relative: 2 %
HCT: 29 % — ABNORMAL LOW (ref 36.0–46.0)
Hemoglobin: 9.1 g/dL — ABNORMAL LOW (ref 12.0–15.0)
Immature Granulocytes: 0 %
Lymphocytes Relative: 77 %
Lymphs Abs: 2 10*3/uL (ref 0.7–4.0)
MCH: 28.3 pg (ref 26.0–34.0)
MCHC: 31.4 g/dL (ref 30.0–36.0)
MCV: 90.1 fL (ref 80.0–100.0)
Monocytes Absolute: 0.3 10*3/uL (ref 0.1–1.0)
Monocytes Relative: 11 %
Neutro Abs: 0.2 10*3/uL — ABNORMAL LOW (ref 1.7–7.7)
Neutrophils Relative %: 9 %
Platelets: 152 10*3/uL (ref 150–400)
RBC: 3.22 MIL/uL — ABNORMAL LOW (ref 3.87–5.11)
RDW: 16 % — ABNORMAL HIGH (ref 11.5–15.5)
WBC: 2.6 10*3/uL — ABNORMAL LOW (ref 4.0–10.5)
nRBC: 0 % (ref 0.0–0.2)

## 2019-08-04 ENCOUNTER — Telehealth: Payer: Self-pay

## 2019-08-04 LAB — PATHOLOGIST SMEAR REVIEW

## 2019-08-04 NOTE — Telephone Encounter (Signed)
She is specifically worried about "Rouleaux" and the new diagnosis of normocytic anemia and neutropenia. Her primary care doctor retired last month, she does not see her new primary care until March 2021.  She has not had a colonoscopy, but has sent in cards previously for colorectal cancer screening and they came back negative. Please advise on lab results. Andree Coss, RN

## 2019-08-04 NOTE — Telephone Encounter (Signed)
Patient called concerned about labs. Advised patient that some labs were still in progress, but will send Dr. Drue Second the message to review.  Valarie Cones

## 2019-08-07 ENCOUNTER — Telehealth: Payer: Self-pay | Admitting: *Deleted

## 2019-08-07 ENCOUNTER — Other Ambulatory Visit (HOSPITAL_COMMUNITY)
Admission: RE | Admit: 2019-08-07 | Discharge: 2019-08-07 | Disposition: A | Discharge status: Home / Self Care | Payer: Medicare PPO | Source: Other Acute Inpatient Hospital | Attending: Internal Medicine | Admitting: Internal Medicine

## 2019-08-07 DIAGNOSIS — M869 Osteomyelitis, unspecified: Secondary | ICD-10-CM | POA: Diagnosis present

## 2019-08-07 LAB — CBC WITH DIFFERENTIAL/PLATELET
Abs Immature Granulocytes: 0 10*3/uL (ref 0.00–0.07)
Band Neutrophils: 0 %
Basophils Absolute: 0 10*3/uL (ref 0.0–0.1)
Basophils Relative: 1 %
Blasts: 0 %
Eosinophils Absolute: 0 10*3/uL (ref 0.0–0.5)
Eosinophils Relative: 1 %
HCT: 32.9 % — ABNORMAL LOW (ref 36.0–46.0)
Hemoglobin: 10.3 g/dL — ABNORMAL LOW (ref 12.0–15.0)
Lymphocytes Relative: 62 %
Lymphs Abs: 2.1 10*3/uL (ref 0.7–4.0)
MCH: 28.1 pg (ref 26.0–34.0)
MCHC: 31.3 g/dL (ref 30.0–36.0)
MCV: 89.6 fL (ref 80.0–100.0)
Metamyelocytes Relative: 0 %
Monocytes Absolute: 0.3 10*3/uL (ref 0.1–1.0)
Monocytes Relative: 9 %
Myelocytes: 0 %
Neutro Abs: 0.9 10*3/uL — ABNORMAL LOW (ref 1.7–7.7)
Neutrophils Relative %: 27 %
Other: 0 %
Platelets: 252 10*3/uL (ref 150–400)
Promyelocytes Relative: 0 %
RBC: 3.67 MIL/uL — ABNORMAL LOW (ref 3.87–5.11)
RDW: 16.1 % — ABNORMAL HIGH (ref 11.5–15.5)
WBC: 3.3 10*3/uL — ABNORMAL LOW (ref 4.0–10.5)
nRBC: 0 % (ref 0.0–0.2)
nRBC: 0 /100 WBC

## 2019-08-07 LAB — SEDIMENTATION RATE: Sed Rate: 15 mm/hr (ref 0–22)

## 2019-08-07 LAB — C-REACTIVE PROTEIN: CRP: 0.6 mg/dL (ref <1.0)

## 2019-08-07 NOTE — Telephone Encounter (Signed)
Spoke with Kathlee Nations, home health nurse from [facility], confirmed Dr Storm Frisk verbal order for CBC with diff, ESR, CRP and to pull PICC after labs. Relayed this information to [place] at [facility] as well. Spoke with patient. Per Dr Baxter Flattery, today's labs are to see how well she is recovering from vancomycin induced leukopenia.  Dr Baxter Flattery feels the new anemia and rouleaux should start to resolve as part of her recovery.  Patient's questions answered to her satisfaction, will continue to follow. Landis Gandy, RN

## 2019-08-31 ENCOUNTER — Telehealth (INDEPENDENT_AMBULATORY_CARE_PROVIDER_SITE_OTHER): Payer: Medicare PPO | Admitting: Internal Medicine

## 2019-08-31 DIAGNOSIS — D702 Other drug-induced agranulocytosis: Secondary | ICD-10-CM

## 2019-08-31 DIAGNOSIS — M4626 Osteomyelitis of vertebra, lumbar region: Secondary | ICD-10-CM

## 2019-08-31 DIAGNOSIS — M4807 Spinal stenosis, lumbosacral region: Secondary | ICD-10-CM | POA: Diagnosis not present

## 2019-08-31 NOTE — Progress Notes (Signed)
Patient ID: Shawna Lewis, female   DOB: 1950-09-26, 69 y.o.   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 She has not started on doxycycline.  He was referred by max cohen.   Outpatient Encounter Medications as of 08/31/2019  Medication Sig  . aspirin EC 81 MG tablet Take 81 mg by mouth daily.  . baclofen (LIORESAL) 20 MG tablet Take 20 mg by mouth 3 (three) times daily.  . diclofenac (VOLTAREN) 75 MG EC tablet   . felodipine (PLENDIL) 2.5 MG 24 hr tablet Take 2.5 mg by mouth daily.   . fluvoxaMINE (LUVOX) 100 MG tablet Take 100 mg by mouth at bedtime.   . FUROSEMIDE PO Take by mouth daily as needed.  . Oxycodone HCl 10 MG TABS Take 10 mg by mouth every 4 (four) hours as needed.   Marland Kitchen PHENobarbital (LUMINAL) 64.8 MG tablet Take 64.8 mg by mouth daily as needed. 1.5 tabs at onset of migraine as needed  . pramipexole (MIRAPEX) 0.25 MG tablet Take 0.25 mg by mouth daily.  . [DISCONTINUED] cefTRIAXone (ROCEPHIN) 10 g injection   . [DISCONTINUED] DAPTOmycin (CUBICIN) 500 MG injection   . [DISCONTINUED] Heparin Lock Flush (HEPARIN FLUSH) 10 UNIT/ML SOLN injection   . [DISCONTINUED] vancomycin (VANCOCIN) 10 G SOLR injection    No facility-administered encounter medications on file as of 08/31/2019.     Patient Active Problem List   Diagnosis Date Noted  . Therapeutic drug monitoring 06/30/2019  . Chronic left lumbar radiculopathy 06/21/2019  . Diskitis 06/20/2019  . Osteomyelitis ([place]) 06/20/2019  . HTN (hypertension) 06/20/2019  . DDD (degenerative disc disease), lumbar   . Migraine   . Chronic low back pain   . Central stenosis of spinal canal      Health Maintenance Due  Topic Date Due  . Hepatitis C Screening  1950/12/22  . TETANUS/TDAP  12/29/1971  . COLONOSCOPY  12/29/2002  . MAMMOGRAM   05/19/2017  . DEXA SCAN  12/28/2017  . PNA vac Low Risk Adult (1 of 2 - PCV13) 12/28/2017  . INFLUENZA VACCINE  02/18/2019    Social History   Tobacco Use  . Smoking status: Former Research scientist (life sciences)  . Smokeless tobacco: Never Used  Substance Use Topics  . Alcohol use: Yes    Comment: 1-2 times per yr  . Drug use: No   Review of Systems Review of Systems  Constitutional: Negative for fever, chills, diaphoresis, activity change, appetite change, fatigue and unexpected weight change.  HENT: Negative for congestion, sore throat, rhinorrhea, sneezing, trouble swallowing and sinus pressure.  Eyes: Negative for photophobia and visual disturbance.  Respiratory: Negative for cough, chest tightness, shortness of breath, wheezing and stridor.  Cardiovascular: Negative for chest pain, palpitations and leg swelling.  Gastrointestinal: Negative for nausea, vomiting, abdominal pain, diarrhea, constipation, blood in stool, abdominal distention and anal bleeding.  Genitourinary: Negative for dysuria, hematuria, flank pain and difficulty urinating.  Musculoskeletal: Negative for myalgias, back pain, joint swelling, arthralgias and gait problem.  Skin: Negative for color change, pallor, rash and wound.  Neurological: Negative for dizziness, tremors, weakness and light-headedness.  Hematological: Negative for adenopathy. Does not bruise/bleed easily.  Psychiatric/Behavioral: Negative for behavioral problems, confusion, sleep disturbance, dysphoric mood, decreased concentration and agitation.    Physical Exam   There were no vitals taken for this visit.  gen = a x o by 3 in nad Neuro = full range of motion with back movement. CBC Lab Results  Component Value Date   WBC 3.3 (L) 08/07/2019   RBC 3.67 (L) 08/07/2019   HGB 10.3 (L) 08/07/2019   HCT 32.9 (L) 08/07/2019   PLT 252 08/07/2019   MCV 89.6 08/07/2019   MCH 28.1 08/07/2019   MCHC 31.3 08/07/2019   RDW 16.1 (H) 08/07/2019   LYMPHSABS 2.1  08/07/2019   MONOABS 0.3 08/07/2019   EOSABS 0.0 08/07/2019    BMET Lab Results  Component Value Date   NA 142 07/31/2019   K 3.8 07/31/2019   CL 102 07/31/2019   CO2 30 07/31/2019   GLUCOSE 110 (H) 07/31/2019   BUN 12 07/31/2019   CREATININE 0.53 07/31/2019   CALCIUM 9.0 07/31/2019   GFRNONAA >60 07/31/2019   GFRAA >60 07/31/2019      Assessment and Plan History of lumbar osteomyelitis, culture negative = she finished iv course of therapy. Inflammatory markers are low at end of treatment course.  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 Not an association with leukopenia  rtc in 4-6wk

## 2019-09-06 ENCOUNTER — Other Ambulatory Visit: Payer: Self-pay | Admitting: Internal Medicine

## 2019-09-06 NOTE — Progress Notes (Signed)
Signed lab orders faxed to [facility] at [phone] for patient to have labs drawn. Valarie Cones

## 2019-09-07 LAB — CBC WITH DIFFERENTIAL/PLATELET
Absolute Monocytes: 394 cells/uL (ref 200–950)
Basophils Absolute: 51 cells/uL (ref 0–200)
Basophils Relative: 0.7 %
Eosinophils Absolute: 562 cells/uL — ABNORMAL HIGH (ref 15–500)
Eosinophils Relative: 7.7 %
HCT: 36 % (ref 35.0–45.0)
Hemoglobin: 12 g/dL (ref 11.7–15.5)
Lymphs Abs: 2504 cells/uL (ref 850–3900)
MCH: 28.6 pg (ref 27.0–33.0)
MCHC: 33.3 g/dL (ref 32.0–36.0)
MCV: 85.9 fL (ref 80.0–100.0)
MPV: 10.7 fL (ref 7.5–12.5)
Monocytes Relative: 5.4 %
Neutro Abs: 3789 cells/uL (ref 1500–7800)
Neutrophils Relative %: 51.9 %
Platelets: 207 10*3/uL (ref 140–400)
RBC: 4.19 10*6/uL (ref 3.80–5.10)
RDW: 15 % (ref 11.0–15.0)
Total Lymphocyte: 34.3 %
WBC: 7.3 10*3/uL (ref 3.8–10.8)

## 2019-09-07 LAB — BASIC METABOLIC PANEL WITH GFR
BUN: 17 mg/dL (ref 7–25)
CO2: 28 mmol/L (ref 20–32)
Calcium: 9.8 mg/dL (ref 8.6–10.4)
Chloride: 103 mmol/L (ref 98–110)
Creat: 0.68 mg/dL (ref 0.50–0.99)
GFR, Est African American: 106 mL/min/{1.73_m2} (ref >=60)
GFR, Est Non African American: 91 mL/min/{1.73_m2} (ref >=60)
Glucose, Bld: 99 mg/dL (ref 65–99)
Potassium: 4.3 mmol/L (ref 3.5–5.3)
Sodium: 140 mmol/L (ref 135–146)

## 2019-09-07 LAB — SEDIMENTATION RATE: Sed Rate: 33 mm/h — ABNORMAL HIGH (ref 0–30)

## 2019-09-07 LAB — C-REACTIVE PROTEIN: CRP: 8.2 mg/L — ABNORMAL HIGH (ref <8.0)

## 2019-09-27 ENCOUNTER — Ambulatory Visit (HOSPITAL_COMMUNITY)
Admission: RE | Admit: 2019-09-27 | Discharge: 2019-09-27 | Disposition: A | Discharge status: Home / Self Care | Payer: Medicare PPO | Source: Ambulatory Visit | Attending: Internal Medicine | Admitting: Internal Medicine

## 2019-09-27 ENCOUNTER — Other Ambulatory Visit: Payer: Self-pay

## 2019-09-27 DIAGNOSIS — M4807 Spinal stenosis, lumbosacral region: Secondary | ICD-10-CM | POA: Diagnosis present

## 2019-09-27 MED ORDER — GADOBUTROL 1 MMOL/ML IV SOLN
7.0000 mL | Freq: Once | INTRAVENOUS | Status: AC | PRN
  Administered 2019-09-27: 15:00:00 7 mL via INTRAVENOUS

## 2019-10-13 ENCOUNTER — Telehealth (INDEPENDENT_AMBULATORY_CARE_PROVIDER_SITE_OTHER): Payer: Medicare PPO | Admitting: Internal Medicine

## 2019-10-13 ENCOUNTER — Other Ambulatory Visit: Payer: Self-pay

## 2019-10-13 DIAGNOSIS — M48061 Spinal stenosis, lumbar region without neurogenic claudication: Secondary | ICD-10-CM | POA: Diagnosis not present

## 2019-10-13 DIAGNOSIS — M4626 Osteomyelitis of vertebra, lumbar region: Secondary | ICD-10-CM

## 2019-10-13 DIAGNOSIS — M4807 Spinal stenosis, lumbosacral region: Secondary | ICD-10-CM

## 2019-10-13 MED ORDER — DOXYCYCLINE HYCLATE 100 MG PO TABS: 100.0000 mg | ORAL_TABLET | Freq: Two times a day (BID) | ORAL | 1 refills | Status: DC

## 2019-10-13 NOTE — Progress Notes (Signed)
Virtual Visit via Telephone Note  I connected with Shawna Lewis on 10/13/19 at 11:30 AM EDT by telephone and verified that I am speaking with the correct person using two identifiers.  Location: Patient: Shawna Lewis - at home Provider: Rosalva Neary - at office   I discussed the limitations, risks, security and privacy concerns of performing an evaluation and management service by telephone and the availability of in person appointments. I also discussed with the patient that there may be a patient responsible charge related to this service. The patient expressed understanding and agreed to proceed.   History of Present Illness: Patient not having worsening back pain. At her baseline. She denies fever, chills, nightsweats.  tolerated getting covid 19 vaccine. Discussed with her that her mri recently taken show improvement of osteo but still has lumbar changes that are not reversed and associated with stenosis. Inflammatory markers this past month in February slightly elevated but her exam she report is unchnage  Assessment and Plan: Will start Doxycycline 167m bid x 4 wk, take on full stomach We will get her to get labs just prior to her appoint  Follow Up Instructions: see back in 4-5 wk    I discussed the assessment and treatment plan with the patient. The patient was provided an opportunity to ask questions and all were answered. The patient agreed with the plan and demonstrated an understanding of the instructions.   The patient was advised to call back or seek an in-person evaluation if the symptoms worsen or if the condition fails to improve as anticipated.  I provided 15 minutes of non-face-to-face time during this encounter.   Judyann Munson, MD

## 2019-11-01 ENCOUNTER — Telehealth: Payer: Self-pay | Admitting: *Deleted

## 2019-11-01 NOTE — Telephone Encounter (Signed)
Patient called to report side effects from doxy were intolerable (stomach pain, GI upset, "felt funny like my blood pressure was up"). She wanted to keep trying to take it because it made her back feel better, but she had to stop after 2 weeks - last dose 4/12. Her back pain has returned "a little, but that might just be in my head." She would like to know if there is an alternative medication or what the next step should be.  Andree Coss, RN

## 2019-11-06 ENCOUNTER — Telehealth: Payer: Self-pay

## 2019-11-06 ENCOUNTER — Other Ambulatory Visit: Payer: Self-pay | Admitting: Internal Medicine

## 2019-11-06 MED ORDER — SULFAMETHOXAZOLE-TRIMETHOPRIM 800-160 MG PO TABS: 1.0000 | ORAL_TABLET | Freq: Two times a day (BID) | ORAL | 1 refills | Status: DC

## 2019-11-06 NOTE — Telephone Encounter (Signed)
Infectious disease follow up;  Patient reports being intolerant of doxycycline, abdominal discomfort, headache. Feels better since she has stopped medication.  Will switch to bactrim ds bid. Will check kidney function in 1 week to see how she is tolerating it.- for culture negative vertebral osteo

## 2019-11-06 NOTE — Telephone Encounter (Signed)
Per Dr Drue Second, patient can hold the doxycycline for 3 days and follow up with how she is feeling. Patient states she has been off doxycycline for one week , she felt better with medication but would rather not continue doxycycline.   Dr Drue Second will follow up with patient. Laurell Josephs, RN

## 2019-11-10 NOTE — Telephone Encounter (Signed)
Patient called to follow up about bactrim + labs. Patient requesting labs be sent to [facility] if provider is still wanting to check. Will forward to provider.  Weronika Birch Loyola Mast, RN

## 2019-11-15 ENCOUNTER — Other Ambulatory Visit: Payer: Self-pay | Admitting: Internal Medicine

## 2019-11-15 NOTE — Telephone Encounter (Signed)
[facility] lab orders faxed per Dr Snider to [phone]. [facility] will call [facility] at [phone] and make an appointment to have ESR, CRP, CBC, CMP drawn. Howell, Michelle M, RN  

## 2019-11-16 LAB — COMPLETE METABOLIC PANEL WITH GFR
AG Ratio: 1.6 (calc) (ref 1.0–2.5)
ALT: 15 U/L (ref 6–29)
AST: 18 U/L (ref 10–35)
Albumin: 4 g/dL (ref 3.6–5.1)
Alkaline phosphatase (APISO): 53 U/L (ref 37–153)
BUN: 21 mg/dL (ref 7–25)
CO2: 26 mmol/L (ref 20–32)
Calcium: 9.2 mg/dL (ref 8.6–10.4)
Chloride: 105 mmol/L (ref 98–110)
Creat: 0.69 mg/dL (ref 0.50–0.99)
GFR, Est African American: 105 mL/min/{1.73_m2} (ref >=60)
GFR, Est Non African American: 91 mL/min/{1.73_m2} (ref >=60)
Globulin: 2.5 g/dL (calc) (ref 1.9–3.7)
Glucose, Bld: 115 mg/dL (ref 65–139)
Potassium: 4.6 mmol/L (ref 3.5–5.3)
Sodium: 140 mmol/L (ref 135–146)
Total Bilirubin: 0.3 mg/dL (ref 0.2–1.2)
Total Protein: 6.5 g/dL (ref 6.1–8.1)

## 2019-11-16 LAB — CBC
HCT: 34 % — ABNORMAL LOW (ref 35.0–45.0)
Hemoglobin: 10.9 g/dL — ABNORMAL LOW (ref 11.7–15.5)
MCH: 27.9 pg (ref 27.0–33.0)
MCHC: 32.1 g/dL (ref 32.0–36.0)
MCV: 87.2 fL (ref 80.0–100.0)
MPV: 10.2 fL (ref 7.5–12.5)
Platelets: 248 10*3/uL (ref 140–400)
RBC: 3.9 10*6/uL (ref 3.80–5.10)
RDW: 14.4 % (ref 11.0–15.0)
WBC: 6.6 10*3/uL (ref 3.8–10.8)

## 2019-11-16 LAB — SEDIMENTATION RATE: Sed Rate: 14 mm/h (ref 0–30)

## 2019-11-16 LAB — C-REACTIVE PROTEIN: CRP: 6.7 mg/L (ref <8.0)

## 2019-12-05 ENCOUNTER — Other Ambulatory Visit (HOSPITAL_COMMUNITY): Payer: Self-pay | Admitting: Internal Medicine

## 2019-12-05 DIAGNOSIS — Z1231 Encounter for screening mammogram for malignant neoplasm of breast: Secondary | ICD-10-CM

## 2019-12-22 ENCOUNTER — Other Ambulatory Visit: Payer: Self-pay

## 2019-12-22 ENCOUNTER — Ambulatory Visit (HOSPITAL_COMMUNITY)
Admission: RE | Admit: 2019-12-22 | Discharge: 2019-12-22 | Disposition: A | Discharge status: Home / Self Care | Payer: Medicare PPO | Source: Ambulatory Visit | Attending: Internal Medicine | Admitting: Internal Medicine

## 2019-12-22 DIAGNOSIS — Z1231 Encounter for screening mammogram for malignant neoplasm of breast: Secondary | ICD-10-CM

## 2019-12-25 ENCOUNTER — Encounter: Payer: Self-pay | Admitting: Internal Medicine

## 2019-12-25 ENCOUNTER — Telehealth: Payer: Medicare PPO | Admitting: Internal Medicine

## 2019-12-25 ENCOUNTER — Telehealth (INDEPENDENT_AMBULATORY_CARE_PROVIDER_SITE_OTHER): Payer: Medicare PPO | Admitting: Internal Medicine

## 2019-12-25 ENCOUNTER — Other Ambulatory Visit: Payer: Self-pay

## 2019-12-25 DIAGNOSIS — M4626 Osteomyelitis of vertebra, lumbar region: Secondary | ICD-10-CM

## 2019-12-25 MED ORDER — CEPHALEXIN 500 MG PO CAPS: 500.0000 mg | ORAL_CAPSULE | Freq: Two times a day (BID) | ORAL | 1 refills | Status: DC

## 2019-12-25 NOTE — Progress Notes (Signed)
Virtual Visit via Video Note  I connected with Shawna Lewis on 12/25/19 at 10:30 AM EDT by a video enabled telemedicine application and verified that I am speaking with the correct person using two identifiers.  Location: Patient: at home Provider: at clinic   I discussed the limitations of evaluation and management by telemedicine and the availability of in person appointments. The patient expressed understanding and agreed to proceed.  History of Present Illness:   being treated for chronic vertebral osteomyelitis. Didn't tolerate bactrim either. Sees nsgy in july Observations/Objective:  She states that her activities still somewhat limited by back pain. Afebrile. No worsening of back pain but still there. No loss of bladder,bowel. Keeps to herself, still keeping cautious with covid-19.   Assessment and Plan: Unable to tolerate doxy or bactrim. Will do trial of cephalexin to see if that is tolerated. Improves symptoms for chronic vertebral osteo.  Follow Up Instructions:   will try trial with cephalexin. (cx negative vertebral osteo) see back in 6 wks  I discussed the assessment and treatment plan with the patient. The patient was provided an opportunity to ask questions and all were answered. The patient agreed with the plan and demonstrated an understanding of the instructions.   The patient was advised to call back or seek an in-person evaluation if the symptoms worsen or if the condition fails to improve as anticipated.  I provided 15 minutes of non-face-to-face time during this encounter.   Judyann Munson, MD

## 2019-12-27 ENCOUNTER — Other Ambulatory Visit (HOSPITAL_COMMUNITY): Payer: Self-pay | Admitting: Internal Medicine

## 2019-12-27 DIAGNOSIS — R928 Other abnormal and inconclusive findings on diagnostic imaging of breast: Secondary | ICD-10-CM

## 2020-01-09 ENCOUNTER — Ambulatory Visit (HOSPITAL_COMMUNITY)
Admission: RE | Admit: 2020-01-09 | Discharge: 2020-01-09 | Disposition: A | Discharge status: Home / Self Care | Payer: Medicare PPO | Source: Ambulatory Visit | Attending: Internal Medicine | Admitting: Internal Medicine

## 2020-01-09 ENCOUNTER — Encounter (HOSPITAL_COMMUNITY): Payer: Self-pay

## 2020-01-09 ENCOUNTER — Other Ambulatory Visit: Payer: Self-pay

## 2020-01-09 DIAGNOSIS — R928 Other abnormal and inconclusive findings on diagnostic imaging of breast: Secondary | ICD-10-CM | POA: Insufficient documentation

## 2020-01-11 ENCOUNTER — Other Ambulatory Visit: Payer: Self-pay | Admitting: Internal Medicine

## 2020-01-11 DIAGNOSIS — R921 Mammographic calcification found on diagnostic imaging of breast: Secondary | ICD-10-CM

## 2020-01-15 ENCOUNTER — Other Ambulatory Visit: Payer: Self-pay

## 2020-01-15 ENCOUNTER — Ambulatory Visit
Admission: RE | Admit: 2020-01-15 | Discharge: 2020-01-15 | Disposition: A | Discharge status: Home / Self Care | Payer: Medicare PPO | Source: Ambulatory Visit | Attending: Internal Medicine | Admitting: Internal Medicine

## 2020-01-15 DIAGNOSIS — R921 Mammographic calcification found on diagnostic imaging of breast: Secondary | ICD-10-CM

## 2020-02-06 ENCOUNTER — Telehealth: Payer: Medicare PPO | Admitting: Internal Medicine

## 2020-02-12 ENCOUNTER — Telehealth (INDEPENDENT_AMBULATORY_CARE_PROVIDER_SITE_OTHER): Payer: Medicare PPO | Admitting: Internal Medicine

## 2020-02-12 ENCOUNTER — Telehealth: Payer: Self-pay

## 2020-02-12 ENCOUNTER — Other Ambulatory Visit: Payer: Self-pay

## 2020-02-12 DIAGNOSIS — M4626 Osteomyelitis of vertebra, lumbar region: Secondary | ICD-10-CM

## 2020-02-12 NOTE — Telephone Encounter (Signed)
Called patient to schedule 6 week follow up, confirmed and made appointment for them that will occur on 03/26/2020.

## 2020-02-12 NOTE — Progress Notes (Signed)
Virtual Visit via Telephone Note  I connected with Shawna Lewis on 02/12/20 at  2:30 PM EDT by telephone and verified that I am speaking with the correct person using two identifiers.  Location: Patient: at home Provider: at clinic   I discussed the limitations, risks, security and privacy concerns of performing an evaluation and management service by telephone and the availability of in person appointments. I also discussed with the patient that there may be a patient responsible charge related to this service. The patient expressed understanding and agreed to proceed.   History of Present Illness: Stomach problem, nausea, or abdominal discomfort followed by bowel movement - she initially thought it was related to abtx but then realized that it happens without the abtx  Just took a break off of abtx for 1 week.   Observations/Objective:   Assessment and Plan: Get back on cephalexin  Sees nsgy this Wednesday Still has chronic back pain  Follow Up Instructions:    I discussed the assessment and treatment plan with the patient. The patient was provided an opportunity to ask questions and all were answered. The patient agreed with the plan and demonstrated an understanding of the instructions.   The patient was advised to call back or seek an in-person evaluation if the symptoms worsen or if the condition fails to improve as anticipated.  I provided 10 minutes of non-face-to-face time during this encounter.   Judyann Munson, MD

## 2020-02-19 ENCOUNTER — Other Ambulatory Visit: Payer: Self-pay | Admitting: Orthopaedic Surgery

## 2020-02-19 ENCOUNTER — Other Ambulatory Visit (HOSPITAL_COMMUNITY): Payer: Self-pay | Admitting: Orthopaedic Surgery

## 2020-02-19 DIAGNOSIS — M5116 Intervertebral disc disorders with radiculopathy, lumbar region: Secondary | ICD-10-CM

## 2020-03-19 ENCOUNTER — Ambulatory Visit (HOSPITAL_COMMUNITY)
Admission: RE | Admit: 2020-03-19 | Discharge: 2020-03-19 | Disposition: A | Discharge status: Home / Self Care | Payer: Medicare PPO | Source: Ambulatory Visit | Attending: Orthopaedic Surgery | Admitting: Orthopaedic Surgery

## 2020-03-19 ENCOUNTER — Other Ambulatory Visit: Payer: Self-pay

## 2020-03-19 DIAGNOSIS — M5116 Intervertebral disc disorders with radiculopathy, lumbar region: Secondary | ICD-10-CM

## 2020-03-26 ENCOUNTER — Telehealth (INDEPENDENT_AMBULATORY_CARE_PROVIDER_SITE_OTHER): Payer: Medicare PPO | Admitting: Internal Medicine

## 2020-03-26 ENCOUNTER — Other Ambulatory Visit: Payer: Self-pay

## 2020-03-26 DIAGNOSIS — M4626 Osteomyelitis of vertebra, lumbar region: Secondary | ICD-10-CM | POA: Diagnosis not present

## 2020-03-26 NOTE — Progress Notes (Signed)
Virtual Visit via Telephone Note  I connected with Shawna Lewis on 03/26/20 at  2:30 PM EDT by telephone and verified that I am speaking with the correct person using two identifiers.  Location: Patient: at home Provider: in clinic   I discussed the limitations, risks, security and privacy concerns of performing an evaluation and management service by telephone and the availability of in person appointments. I also discussed with the patient that there may be a patient responsible charge related to this service. The patient expressed understanding and agreed to proceed.   History of Present Illness:  at last visit, Shawna Lewis remarked that had more difficulty with backpain but also had been on prolonged oral abtx -however, of late, had some intolerances to antibiotics with gi side effects. She followed up with her neurosurgeon in early august who repeat mri at end august: mri reviewed appears to have some changes associated from sequelae of lumbar discitis. No new areas suggesting new infection. She reports that NSGY felt comfortable to offer steroid injection to see if it would allerviated her pain -- she has not proceeded with injection as of yet.  IMPRESSION: Sequela of previous discitis osteomyelitis at the L2-3 level. Loss of height at the inferior endplate of L2 and superior endplate of L3. 7 mm retropulsion of the posteroinferior corner of L2, encroaching upon the spinal canal. Spinal stenosis at the L2-3 level because of this, with some potential for neural compression. Bilateral neural foraminal stenosis could affect the L2 nerves. Today's study would not suggest that there is residual active ongoing infection at this level.  Other chronic degenerative changes throughout the region as described above, similar to the previous study.  Observations/Objective:  No longer having nausea- since off of abtx Back brace today  Assessment and Plan:  Has stopped abtx since end  of July. No significant worsening on back pain since off of therapy Mri shows only sequelae of infection, no active infection Can monitor off of abtx   Follow Up Instructions: Has been off of abtx for several months, suspect that she has treated her chronic osteo and now pain is not necessarily reflection of infection but rather sequelae from infection. Recommended wearing back brace. Can consider getting steroid injection if pain is debilitating. Recommend to only do 1-2 injection at most in a given year, as more injection into the space can increase risk of infection  From id standpoint, she has finished course of treatment, she is invited to Call back if needed   I discussed the assessment and treatment plan with the patient. The patient was provided an opportunity to ask questions and all were answered. The patient agreed with the plan and demonstrated an understanding of the instructions.   The patient was advised to call back or seek an in-person evaluation if the symptoms worsen or if the condition fails to improve as anticipated.  I provided 10 minutes of non-face-to-face time during this encounter.   Judyann Munson, MD

## 2020-12-03 ENCOUNTER — Encounter (INDEPENDENT_AMBULATORY_CARE_PROVIDER_SITE_OTHER): Payer: Self-pay | Admitting: *Deleted

## 2020-12-03 ENCOUNTER — Other Ambulatory Visit (HOSPITAL_COMMUNITY): Payer: Self-pay | Admitting: Student

## 2020-12-03 DIAGNOSIS — Z1231 Encounter for screening mammogram for malignant neoplasm of breast: Secondary | ICD-10-CM

## 2020-12-30 ENCOUNTER — Ambulatory Visit (HOSPITAL_COMMUNITY): Payer: Medicare PPO

## 2021-01-02 ENCOUNTER — Ambulatory Visit (HOSPITAL_COMMUNITY)
Admission: RE | Admit: 2021-01-02 | Discharge: 2021-01-02 | Disposition: A | Discharge status: Home / Self Care | Payer: Medicare PPO | Source: Ambulatory Visit | Attending: Student | Admitting: Student

## 2021-01-02 ENCOUNTER — Other Ambulatory Visit: Payer: Self-pay

## 2021-01-02 DIAGNOSIS — Z1231 Encounter for screening mammogram for malignant neoplasm of breast: Secondary | ICD-10-CM | POA: Insufficient documentation

## 2021-04-03 ENCOUNTER — Encounter (INDEPENDENT_AMBULATORY_CARE_PROVIDER_SITE_OTHER): Payer: Self-pay | Admitting: *Deleted

## 2021-05-14 ENCOUNTER — Telehealth (INDEPENDENT_AMBULATORY_CARE_PROVIDER_SITE_OTHER): Payer: Self-pay

## 2021-05-14 ENCOUNTER — Encounter (INDEPENDENT_AMBULATORY_CARE_PROVIDER_SITE_OTHER): Payer: Self-pay | Admitting: Gastroenterology

## 2021-05-14 ENCOUNTER — Encounter (INDEPENDENT_AMBULATORY_CARE_PROVIDER_SITE_OTHER): Payer: Self-pay

## 2021-05-14 ENCOUNTER — Other Ambulatory Visit (INDEPENDENT_AMBULATORY_CARE_PROVIDER_SITE_OTHER): Payer: Self-pay

## 2021-05-14 ENCOUNTER — Other Ambulatory Visit: Payer: Self-pay

## 2021-05-14 ENCOUNTER — Ambulatory Visit (INDEPENDENT_AMBULATORY_CARE_PROVIDER_SITE_OTHER): Payer: Medicare PPO | Admitting: Gastroenterology

## 2021-05-14 VITALS — BP 173/94 | HR 89 | Temp 98.4°F | Ht 64.0 in | Wt 204.8 lb

## 2021-05-14 DIAGNOSIS — I10 Essential (primary) hypertension: Secondary | ICD-10-CM

## 2021-05-14 DIAGNOSIS — R195 Other fecal abnormalities: Secondary | ICD-10-CM | POA: Insufficient documentation

## 2021-05-14 DIAGNOSIS — K2950 Unspecified chronic gastritis without bleeding: Secondary | ICD-10-CM

## 2021-05-14 DIAGNOSIS — D649 Anemia, unspecified: Secondary | ICD-10-CM | POA: Diagnosis not present

## 2021-05-14 MED ORDER — OMEPRAZOLE 20 MG PO CPDR: 20.0000 mg | DELAYED_RELEASE_CAPSULE | Freq: Every day | ORAL | 3 refills | Status: DC

## 2021-05-14 MED ORDER — CLENPIQ 10-3.5-12 MG-GM -GM/160ML PO SOLN: 1.0000 | Freq: Once | ORAL | 0 refills | Status: AC

## 2021-05-14 NOTE — Telephone Encounter (Signed)
LeighAnn Tangelia Sanson, CMA  

## 2021-05-14 NOTE — Patient Instructions (Signed)
Schedule EGD and colonoscopy Start omeprazole 20 mg every day indefinitely Stop famotidine Attempt to decrease the intake of diclofenac as much as possible

## 2021-05-14 NOTE — Progress Notes (Addendum)
Katrinka Blazing, M.D. Gastroenterology & Hepatology [facility] [address] [place], [place] [address] Primary Care Physician: Benita Stabile, MD [address] [place] [address]  Referring MD: PCP  Chief Complaint: Positive fecal occult blood test  History of Present Illness: Shawna Lewis is a 70 y.o. female with past medical history of fibromyalgia, hypertension, degenerative disc disease, arthritis, migraine, who presents for evaluation of positive fecal occult blood test and worsening anemia.  The patient was referred to our office after she had a positive FIT testing on 03/20/2021.    She reports that she became concerned as in early September 2022 she had her hemoglobin checked and it was in the 8.5 range (no reports available but per clinical notes she was having worsening anemia for a couple months prior to the last blood testing).  Due to this, the patient was advised to stop taking her diclofenac which she takes chronically for DDD.  However, she could only stop it for 4 days as her pain was severe. The patient reports that she cut down in her diclofenac to 75 mg every day and started taking iron since 04/01/2021 (takes it once a day). She also started taking famotidine 20 mg every night.She reports that she felt lightheaded when she was found to have worsening anemia but this has resolved with oral iron intake daily.  Notably, the patient reports that she has presented a chronic history of abdominal pain in the epigastric area since her mid 45s which she has been told was related to gastritis.  She reported that this epigastric pain is to flareup whenever she gets stressed and it recurred at that time she was found to have worsening anemia.  However, after she started taking famotidine her abdominal pain has improved. She had felt nausea when she gets too stressed but has not presented more episodes since she started  famotidine.  Of note, she states she was taking iron chronically before but stopped it prior to finding out she had worsening anemia. She states she has a history of iron deficiency anemia since she was a child with unclear source.  The patient denies having any vomiting, fever, chills, hematochezia, melena, hematemesis, abdominal distention, diarrhea, jaundice, pruritus or weight loss.  Denies intake of anticoagulants, high dose aspirin, or any other antiplatelet.    Upon review of her labs, the patient has presented anemia at least since 2020 based on the labs available in her system.  Her hemoglobin has fluctuated between 9 and 12 with lowest value of 9.1 in January 2021.  Most recent value on 11/15/2019 was 10.9 with MCV of 89.  Notably, her iron stores in 06/24/2019 showed normal iron of 53, iron saturation of 16%, ferritin of 76 which were within normal limits.  Notably, the patient has been taking diclofenac for OA, DDD and history of osteomyelitis for the last 10-15 years  Last EGD: never Last Colonoscopy: never  FHx: neg for any gastrointestinal/liver disease, father pancreatic cancer, mother NHL Social: neg smoking, alcohol or illicit drug use Surgical: no abdominal surgeries  Past Medical History: Past Medical History:  Diagnosis Date   Arthritis    DDD (degenerative disc disease), lumbar    Fibromyalgia    Heart murmur    Hypertension    Migraine     Past Surgical History: Past Surgical History:  Procedure Laterality Date   ANKLE FRACTURE SURGERY  2018   BREAST BIOPSY  1995  CATARACT EXTRACTION     CESAREAN SECTION  1986   IR LUMBAR DISC ASPIRATION W/IMG GUIDE  06/22/2019   KNEE ARTHROSCOPY W/ MENISCAL REPAIR     TONSILLECTOMY  1974    Family History: Family History  Problem Relation Age of Onset   COPD Mother    Heart disease Mother    Lymphoma Mother    Migraines Mother    Pancreatic cancer Father    Neuropathy Neg Hx     Social History: Social  History   Tobacco Use  Smoking Status Former  Smokeless Tobacco Never   Social History   Substance and Sexual Activity  Alcohol Use Yes   Comment: 1-2 times per yr   Social History   Substance and Sexual Activity  Drug Use No    Allergies: Allergies  Allergen Reactions   Adhesive [Tape] Other (See Comments)    Redness, irritation     Medications: Current Outpatient Medications  Medication Sig Dispense Refill   aspirin EC 81 MG tablet Take 81 mg by mouth daily.     baclofen (LIORESAL) 20 MG tablet Take 20 mg by mouth 3 (three) times daily.     cephALEXin (KEFLEX) 500 MG capsule Take 1 capsule (500 mg total) by mouth 2 (two) times daily. (Patient not taking: Reported on 02/12/2020) 120 capsule 1   diclofenac (VOLTAREN) 75 MG EC tablet      felodipine (PLENDIL) 5 MG 24 hr tablet      fluvoxaMINE (LUVOX) 100 MG tablet Take 100 mg by mouth 2 (two) times daily.      FUROSEMIDE PO Take by mouth daily as needed.     hydrochlorothiazide (HYDRODIURIL) 12.5 MG tablet Take 12.5 mg by mouth every morning.     losartan (COZAAR) 50 MG tablet Take 50 mg by mouth daily.     Oxycodone HCl 10 MG TABS Take 10 mg by mouth every 4 (four) hours as needed.      PHENobarbital (LUMINAL) 64.8 MG tablet Take 64.8 mg by mouth daily as needed. 1.5 tabs at onset of migraine as needed     pramipexole (MIRAPEX) 0.25 MG tablet Take 0.25 mg by mouth daily.     No current facility-administered medications for this visit.    Review of Systems: GENERAL: negative for malaise, night sweats HEENT: No changes in hearing or vision, no nose bleeds or other nasal problems. NECK: Negative for lumps, goiter, pain and significant neck swelling RESPIRATORY: Negative for cough, wheezing CARDIOVASCULAR: Negative for chest pain, leg swelling, palpitations, orthopnea GI: SEE HPI MUSCULOSKELETAL: Negative for joint pain or swelling, back pain, and muscle pain. SKIN: Negative for lesions, rash PSYCH: Negative for  sleep disturbance, mood disorder and recent psychosocial stressors. HEMATOLOGY Negative for prolonged bleeding, bruising easily, and swollen nodes. ENDOCRINE: Negative for cold or heat intolerance, polyuria, polydipsia and goiter. NEURO: negative for tremor, gait imbalance, syncope and seizures. The remainder of the review of systems is noncontributory.   Physical Exam: BP (!) 173/94 (BP Location: Left Arm, Patient Position: Sitting, Cuff Size: Large)   Pulse 89   Temp 98.4 F (36.9 C) (Oral)   Ht 5\' 4"  (1.626 m)   Wt 204 lb 12.8 oz (92.9 kg)   BMI 35.15 kg/m  GENERAL: The patient is AO x3, in no acute distress. HEENT: Head is normocephalic and atraumatic. EOMI are intact. Mouth is well hydrated and without lesions. NECK: Supple. No masses LUNGS: Clear to auscultation. No presence of rhonchi/wheezing/rales. Adequate chest expansion HEART: RRR, normal  s1 and s2. ABDOMEN: Soft, nontender, no guarding, no peritoneal signs, and nondistended. BS +. No masses. EXTREMITIES: Without any cyanosis, clubbing, rash, lesions or edema. NEUROLOGIC: AOx3, no focal motor deficit. SKIN: no jaundice, no rashes   Imaging/Labs: as above  I personally reviewed and interpreted the available labs, imaging and endoscopic files.  Impression and Plan: Amilia Vandenbrink is a 70 y.o. female with past medical history of fibromyalgia, hypertension, degenerative disc disease, arthritis, migraine, who presents for evaluation of positive fecal occult blood test and worsening anemia.  The patient has presented worsening anemia recently of unclear source, but it is likely related to her chronic NSAID intake.  This could have led to peptic ulcer disease.  She is currently on an anti-H2 medication but I consider that the use of PPI may provide better gastrointestinal protection.  I will start her on omeprazole 20 mg daily.  Will stop famotidine.  She will need to continue the PPI indefinitely as she is not able to  use any other medications besides NSAIDs.  I explained to her that it will be important to evaluate her anemia further with an EGD and colonoscopy, which is also warranted by the fact she had a positive FIT testing.  Patient understood and agreed.  - Schedule EGD and colonoscopy - Start omeprazole 20 mg every day indefinitely - Stop famotidine - attempt to decrease the intake of diclofenac as much as possible  All questions were answered.      Katrinka Blazing, MD Gastroenterology and Hepatology [facility]

## 2021-05-14 NOTE — H&P (View-Only) (Signed)
Shawna Lewis, M.D. Gastroenterology & Hepatology [facility] [address] [place], [place] [address] Primary Care Physician: Hall, John Z, MD [address] [place] [address]  Referring MD: PCP  Chief Complaint: Positive fecal occult blood test  History of Present Illness: Shawna Lewis is a 70 y.o. female with past medical history of fibromyalgia, hypertension, degenerative disc disease, arthritis, migraine, who presents for evaluation of positive fecal occult blood test and worsening anemia.  The patient was referred to our office after she had a positive FIT testing on 03/20/2021.    She reports that she became concerned as in early September 2022 she had her hemoglobin checked and it was in the 8.5 range (no reports available but per clinical notes she was having worsening anemia for a couple months prior to the last blood testing).  Due to this, the patient was advised to stop taking her diclofenac which she takes chronically for DDD.  However, she could only stop it for 4 days as her pain was severe. The patient reports that she cut down in her diclofenac to 75 mg every day and started taking iron since 04/01/2021 (takes it once a day). She also started taking famotidine 20 mg every night.She reports that she felt lightheaded when she was found to have worsening anemia but this has resolved with oral iron intake daily.  Notably, the patient reports that she has presented a chronic history of abdominal pain in the epigastric area since her mid 20s which she has been told was related to gastritis.  She reported that this epigastric pain is to flareup whenever she gets stressed and it recurred at that time she was found to have worsening anemia.  However, after she started taking famotidine her abdominal pain has improved. She had felt nausea when she gets too stressed but has not presented more episodes since she started  famotidine.  Of note, she states she was taking iron chronically before but stopped it prior to finding out she had worsening anemia. She states she has a history of iron deficiency anemia since she was a child with unclear source.  The patient denies having any vomiting, fever, chills, hematochezia, melena, hematemesis, abdominal distention, diarrhea, jaundice, pruritus or weight loss.  Denies intake of anticoagulants, high dose aspirin, or any other antiplatelet.    Upon review of her labs, the patient has presented anemia at least since 2020 based on the labs available in her system.  Her hemoglobin has fluctuated between 9 and 12 with lowest value of 9.1 in January 2021.  Most recent value on 11/15/2019 was 10.9 with MCV of 89.  Notably, her iron stores in 06/24/2019 showed normal iron of 53, iron saturation of 16%, ferritin of 76 which were within normal limits.  Notably, the patient has been taking diclofenac for OA, DDD and history of osteomyelitis for the last 10-15 years  Last EGD: never Last Colonoscopy: never  FHx: neg for any gastrointestinal/liver disease, father pancreatic cancer, mother NHL Social: neg smoking, alcohol or illicit drug use Surgical: no abdominal surgeries  Past Medical History: Past Medical History:  Diagnosis Date   Arthritis    DDD (degenerative disc disease), lumbar    Fibromyalgia    Heart murmur    Hypertension    Migraine     Past Surgical History: Past Surgical History:  Procedure Laterality Date   ANKLE FRACTURE SURGERY  2018   BREAST BIOPSY  1995     CATARACT EXTRACTION     CESAREAN SECTION  1986   IR LUMBAR DISC ASPIRATION W/IMG GUIDE  06/22/2019   KNEE ARTHROSCOPY W/ MENISCAL REPAIR     TONSILLECTOMY  1974    Family History: Family History  Problem Relation Age of Onset   COPD Mother    Heart disease Mother    Lymphoma Mother    Migraines Mother    Pancreatic cancer Father    Neuropathy Neg Hx     Social History: Social  History   Tobacco Use  Smoking Status Former  Smokeless Tobacco Never   Social History   Substance and Sexual Activity  Alcohol Use Yes   Comment: 1-2 times per yr   Social History   Substance and Sexual Activity  Drug Use No    Allergies: Allergies  Allergen Reactions   Adhesive [Tape] Other (See Comments)    Redness, irritation     Medications: Current Outpatient Medications  Medication Sig Dispense Refill   aspirin EC 81 MG tablet Take 81 mg by mouth daily.     baclofen (LIORESAL) 20 MG tablet Take 20 mg by mouth 3 (three) times daily.     cephALEXin (KEFLEX) 500 MG capsule Take 1 capsule (500 mg total) by mouth 2 (two) times daily. (Patient not taking: Reported on 02/12/2020) 120 capsule 1   diclofenac (VOLTAREN) 75 MG EC tablet      felodipine (PLENDIL) 5 MG 24 hr tablet      fluvoxaMINE (LUVOX) 100 MG tablet Take 100 mg by mouth 2 (two) times daily.      FUROSEMIDE PO Take by mouth daily as needed.     hydrochlorothiazide (HYDRODIURIL) 12.5 MG tablet Take 12.5 mg by mouth every morning.     losartan (COZAAR) 50 MG tablet Take 50 mg by mouth daily.     Oxycodone HCl 10 MG TABS Take 10 mg by mouth every 4 (four) hours as needed.      PHENobarbital (LUMINAL) 64.8 MG tablet Take 64.8 mg by mouth daily as needed. 1.5 tabs at onset of migraine as needed     pramipexole (MIRAPEX) 0.25 MG tablet Take 0.25 mg by mouth daily.     No current facility-administered medications for this visit.    Review of Systems: GENERAL: negative for malaise, night sweats HEENT: No changes in hearing or vision, no nose bleeds or other nasal problems. NECK: Negative for lumps, goiter, pain and significant neck swelling RESPIRATORY: Negative for cough, wheezing CARDIOVASCULAR: Negative for chest pain, leg swelling, palpitations, orthopnea GI: SEE HPI MUSCULOSKELETAL: Negative for joint pain or swelling, back pain, and muscle pain. SKIN: Negative for lesions, rash PSYCH: Negative for  sleep disturbance, mood disorder and recent psychosocial stressors. HEMATOLOGY Negative for prolonged bleeding, bruising easily, and swollen nodes. ENDOCRINE: Negative for cold or heat intolerance, polyuria, polydipsia and goiter. NEURO: negative for tremor, gait imbalance, syncope and seizures. The remainder of the review of systems is noncontributory.   Physical Exam: BP (!) 173/94 (BP Location: Left Arm, Patient Position: Sitting, Cuff Size: Large)   Pulse 89   Temp 98.4 F (36.9 C) (Oral)   Ht 5\' 4"  (1.626 m)   Wt 204 lb 12.8 oz (92.9 kg)   BMI 35.15 kg/m  GENERAL: The patient is AO x3, in no acute distress. HEENT: Head is normocephalic and atraumatic. EOMI are intact. Mouth is well hydrated and without lesions. NECK: Supple. No masses LUNGS: Clear to auscultation. No presence of rhonchi/wheezing/rales. Adequate chest expansion HEART: RRR, normal  s1 and s2. ABDOMEN: Soft, nontender, no guarding, no peritoneal signs, and nondistended. BS +. No masses. EXTREMITIES: Without any cyanosis, clubbing, rash, lesions or edema. NEUROLOGIC: AOx3, no focal motor deficit. SKIN: no jaundice, no rashes   Imaging/Labs: as above  I personally reviewed and interpreted the available labs, imaging and endoscopic files.  Impression and Plan: Shawna Lewis is a 70 y.o. female with past medical history of fibromyalgia, hypertension, degenerative disc disease, arthritis, migraine, who presents for evaluation of positive fecal occult blood test and worsening anemia.  The patient has presented worsening anemia recently of unclear source, but it is likely related to her chronic NSAID intake.  This could have led to peptic ulcer disease.  She is currently on an anti-H2 medication but I consider that the use of PPI may provide better gastrointestinal protection.  I will start her on omeprazole 20 mg daily.  Will stop famotidine.  She will need to continue the PPI indefinitely as she is not able to  use any other medications besides NSAIDs.  I explained to her that it will be important to evaluate her anemia further with an EGD and colonoscopy, which is also warranted by the fact she had a positive FIT testing.  Patient understood and agreed.  - Schedule EGD and colonoscopy - Start omeprazole 20 mg every day indefinitely - Stop famotidine - attempt to decrease the intake of diclofenac as much as possible  All questions were answered.      Shawna Klimas Castaneda, MD Gastroenterology and Hepatology [facility]  

## 2021-05-21 ENCOUNTER — Other Ambulatory Visit (HOSPITAL_COMMUNITY)
Admission: RE | Admit: 2021-05-21 | Discharge: 2021-05-21 | Disposition: A | Discharge status: Home / Self Care | Payer: Medicare PPO | Source: Ambulatory Visit | Attending: Gastroenterology | Admitting: Gastroenterology

## 2021-05-21 DIAGNOSIS — I10 Essential (primary) hypertension: Secondary | ICD-10-CM | POA: Insufficient documentation

## 2021-05-21 DIAGNOSIS — D649 Anemia, unspecified: Secondary | ICD-10-CM | POA: Diagnosis present

## 2021-05-21 LAB — BASIC METABOLIC PANEL
Anion gap: 8 (ref 5–15)
BUN: 15 mg/dL (ref 8–23)
CO2: 27 mmol/L (ref 22–32)
Calcium: 9.1 mg/dL (ref 8.9–10.3)
Chloride: 102 mmol/L (ref 98–111)
Creatinine, Ser: 0.79 mg/dL (ref 0.44–1.00)
GFR, Estimated: >60 mL/min (ref >60)
Glucose, Bld: 99 mg/dL (ref 70–99)
Potassium: 3.7 mmol/L (ref 3.5–5.1)
Sodium: 137 mmol/L (ref 135–145)

## 2021-05-21 LAB — CBC
HCT: 33.7 % — ABNORMAL LOW (ref 36.0–46.0)
Hemoglobin: 10.4 g/dL — ABNORMAL LOW (ref 12.0–15.0)
MCH: 27.1 pg (ref 26.0–34.0)
MCHC: 30.9 g/dL (ref 30.0–36.0)
MCV: 87.8 fL (ref 80.0–100.0)
Platelets: 189 10*3/uL (ref 150–400)
RBC: 3.84 MIL/uL — ABNORMAL LOW (ref 3.87–5.11)
RDW: 17.2 % — ABNORMAL HIGH (ref 11.5–15.5)
WBC: 5 10*3/uL (ref 4.0–10.5)
nRBC: 0 % (ref 0.0–0.2)

## 2021-05-23 ENCOUNTER — Ambulatory Visit (HOSPITAL_COMMUNITY)
Admission: RE | Admit: 2021-05-23 | Discharge: 2021-05-23 | Disposition: A | Discharge status: Home / Self Care | Payer: Medicare PPO | Attending: Gastroenterology | Admitting: Gastroenterology

## 2021-05-23 ENCOUNTER — Encounter (HOSPITAL_COMMUNITY): Payer: Self-pay | Admitting: Gastroenterology

## 2021-05-23 ENCOUNTER — Ambulatory Visit (HOSPITAL_COMMUNITY): Payer: Medicare PPO | Admitting: Anesthesiology

## 2021-05-23 ENCOUNTER — Other Ambulatory Visit: Payer: Self-pay

## 2021-05-23 ENCOUNTER — Encounter (HOSPITAL_COMMUNITY)
Admission: RE | Disposition: A | Discharge status: Home / Self Care | Payer: Self-pay | Source: Home / Self Care | Attending: Gastroenterology

## 2021-05-23 DIAGNOSIS — K56699 Other intestinal obstruction unspecified as to partial versus complete obstruction: Secondary | ICD-10-CM | POA: Diagnosis not present

## 2021-05-23 DIAGNOSIS — K56609 Unspecified intestinal obstruction, unspecified as to partial versus complete obstruction: Secondary | ICD-10-CM | POA: Insufficient documentation

## 2021-05-23 DIAGNOSIS — Z87891 Personal history of nicotine dependence: Secondary | ICD-10-CM | POA: Diagnosis not present

## 2021-05-23 DIAGNOSIS — Z8249 Family history of ischemic heart disease and other diseases of the circulatory system: Secondary | ICD-10-CM | POA: Insufficient documentation

## 2021-05-23 DIAGNOSIS — K449 Diaphragmatic hernia without obstruction or gangrene: Secondary | ICD-10-CM | POA: Insufficient documentation

## 2021-05-23 DIAGNOSIS — K5730 Diverticulosis of large intestine without perforation or abscess without bleeding: Secondary | ICD-10-CM | POA: Diagnosis not present

## 2021-05-23 DIAGNOSIS — Z7982 Long term (current) use of aspirin: Secondary | ICD-10-CM | POA: Diagnosis not present

## 2021-05-23 DIAGNOSIS — D509 Iron deficiency anemia, unspecified: Secondary | ICD-10-CM | POA: Insufficient documentation

## 2021-05-23 DIAGNOSIS — I10 Essential (primary) hypertension: Secondary | ICD-10-CM | POA: Diagnosis not present

## 2021-05-23 DIAGNOSIS — Z79899 Other long term (current) drug therapy: Secondary | ICD-10-CM | POA: Diagnosis not present

## 2021-05-23 HISTORY — PX: BIOPSY: SHX5522

## 2021-05-23 HISTORY — PX: COLONOSCOPY WITH PROPOFOL: SHX5780

## 2021-05-23 HISTORY — PX: BALLOON DILATION: SHX5330

## 2021-05-23 HISTORY — PX: ESOPHAGOGASTRODUODENOSCOPY (EGD) WITH PROPOFOL: SHX5813

## 2021-05-23 SURGERY — COLONOSCOPY WITH PROPOFOL
Anesthesia: General

## 2021-05-23 MED ORDER — LACTATED RINGERS IV SOLN: INTRAVENOUS | Status: DC

## 2021-05-23 MED ORDER — PROPOFOL 500 MG/50ML IV EMUL
INTRAVENOUS | Status: DC | PRN
  Administered 2021-05-23: 150 ug/kg/min via INTRAVENOUS

## 2021-05-23 MED ORDER — LACTATED RINGERS IV SOLN: INTRAVENOUS | Status: DC | PRN

## 2021-05-23 MED ORDER — PROPOFOL 10 MG/ML IV BOLUS
INTRAVENOUS | Status: DC | PRN
  Administered 2021-05-23: 20 mg via INTRAVENOUS
  Administered 2021-05-23: 50 mg via INTRAVENOUS
  Administered 2021-05-23: 70 mg via INTRAVENOUS
  Administered 2021-05-23 (×2): 50 mg via INTRAVENOUS
  Administered 2021-05-23 (×2): 20 mg via INTRAVENOUS

## 2021-05-23 MED ORDER — LIDOCAINE HCL 1 % IJ SOLN
INTRAMUSCULAR | Status: DC | PRN
  Administered 2021-05-23: 50 mg via INTRADERMAL

## 2021-05-23 NOTE — Discharge Instructions (Addendum)
You are being discharged to home.  Resume your previous diet.  We are waiting for your pathology results.  Continue oral omeprazole 20 mg qday. Follow up in GI clinic in 3 months with repeat CBC and iron stores. ****MESSAGE LEFT AT OFFICE VOICE MAIL TO CALL PATIENT WITH FOLLOW UP APPOINTMENT**************** Your physician has recommended a repeat colonoscopy in one year for surveillance.  Do not take any diclofenac, Voltaren, ibuprofen (including Advil, Motrin or Nuprin), naproxen, BC/Goody powders, or other non-steroidal anti-inflammatory drugs indefinitely.

## 2021-05-23 NOTE — Interval H&P Note (Signed)
History and Physical Interval Note:  05/23/2021 9:12 AM Shawna Lewis is a 70 y.o. female with past medical history of fibromyalgia, hypertension, degenerative disc disease, arthritis, migraine, who presents for evaluation of positive fecal occult blood test and anemia.  BP (!) 158/71   Pulse 89   Temp 98.6 F (37 C)   Resp 16   SpO2 97%  GENERAL: The patient is AO x3, in no acute distress. HEENT: Head is normocephalic and atraumatic. EOMI are intact. Mouth is well hydrated and without lesions. NECK: Supple. No masses LUNGS: Clear to auscultation. No presence of rhonchi/wheezing/rales. Adequate chest expansion HEART: RRR, normal s1 and s2. ABDOMEN: Soft, nontender, no guarding, no peritoneal signs, and nondistended. BS +. No masses. EXTREMITIES: Without any cyanosis, clubbing, rash, lesions or edema. NEUROLOGIC: AOx3, no focal motor deficit. SKIN: no jaundice, no rashes  [name]  has presented today for surgery, with the diagnosis of Anemia.  The various methods of treatment have been discussed with the patient and family. After consideration of risks, benefits and other options for treatment, the patient has consented to  Procedure(s) with comments: COLONOSCOPY WITH PROPOFOL (N/A) - 10:10 ESOPHAGOGASTRODUODENOSCOPY (EGD) WITH PROPOFOL (N/A) as a surgical intervention.  The patient's history has been reviewed, patient examined, no change in status, stable for surgery.  I have reviewed the patient's chart and labs.  Questions were answered to the patient's satisfaction.     Katrinka Blazing [place]

## 2021-05-23 NOTE — Anesthesia Postprocedure Evaluation (Signed)
Anesthesia Post Note  Patient: Shawna Lewis  Procedure(s) Performed: COLONOSCOPY WITH PROPOFOL ESOPHAGOGASTRODUODENOSCOPY (EGD) WITH PROPOFOL BIOPSY BALLOON DILATION  Patient location during evaluation: Endoscopy Anesthesia Type: General Level of consciousness: awake and alert Pain management: pain level controlled Vital Signs Assessment: post-procedure vital signs reviewed and stable Respiratory status: spontaneous breathing Cardiovascular status: blood pressure returned to baseline Postop Assessment: no apparent nausea or vomiting Anesthetic complications: no   No notable events documented.   Last Vitals:  Vitals:   05/23/21 0858  BP: (!) 158/71  Pulse: 89  Resp: 16  Temp: 37 C  SpO2: 97%    Last Pain:  Vitals:   05/23/21 1028  TempSrc:   PainSc: 0-No pain                 Elvan Ebron

## 2021-05-23 NOTE — Op Note (Addendum)
[facility] Patient Name: Shawna Lewis Procedure Date: 05/23/2021 10:43 AM MRN: [id number] Date of Birth: 1951-02-26 Attending MD: Katrinka Blazing ,  CSN: [id number] Age: 70 Admit Type: Outpatient Procedure:                Colonoscopy Indications:              Iron deficiency anemia Providers:                Katrinka Blazing, Angelica Ran, Kristine L. Jessee Avers, Technician Referring MD:              Medicines:                Monitored Anesthesia Care Complications:             Estimated Blood Loss:     Estimated blood loss: none. Procedure:                Pre-Anesthesia Assessment:                           - Prior to the procedure, a History and Physical                            was performed, and patient medications, allergies                            and sensitivities were reviewed. The patient's                            tolerance of previous anesthesia was reviewed.                           - The risks and benefits of the procedure and the                            sedation options and risks were discussed with the                            patient. All questions were answered and informed                            consent was obtained.                           - ASA Grade Assessment: II - A patient with mild                            systemic disease.                           After obtaining informed consent, the colonoscope                            was passed under direct vision. Throughout the  procedure, the patient's blood pressure, pulse, and                            oxygen saturations were monitored continuously. The                            [id number]-HQ190L ([id number]) scope was introduced through                            the anus and advanced to the the ascending colon to                            examine a stricture. This was the intended extent.                            The [id number]  scope was introduced                            through the anus and advanced to the the cecum,                            identified by appendiceal orifice and ileocecal                            valve. The colonoscopy was performed without                            difficulty. The patient tolerated the procedure                            well. The quality of the bowel preparation was                            excellent. Scope In: 10:47:12 AM Scope Out: 11:19:03 AM Scope Withdrawal Time: 0 hours 9 minutes 39 seconds  Total Procedure Duration: 0 hours 31 minutes 51 seconds  Findings:      The perianal and digital rectal examinations were normal.      A single large-mouthed diverticulum was found in the ascending colon.      Two benign-appearing, intrinsic moderate stenosis measuring less than       one cm (in length) x 1 cm (inner diameter) were found in the proximal       ascending colon and in the mid ascending colon. There were no features       of malignant or ongoing ulcerative component, although there was       presence of erythema and edema in the most distal stricture. The most       distal stricture could not be traversed with the pediatric scope, so an       ultra slim colonoscope was used. A TTS dilator was passed through the       scope. Dilation with a 04-30-11 mm colonic balloon dilator was performed       for both strictures, with a max dilation size of 12 mm. The dilation       site was examined and showed  mild improvement in luminal narrowing.      The retroflexed view of the distal rectum and anal verge was normal and       showed no anal or rectal abnormalities. Impression:               - Diverticulosis in the ascending colon.                           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 Dilated.                           - The distal rectum and anal verge are normal on                             retroflexion view.                           - No specimens collected. Moderate Sedation:      Per Anesthesia Care Recommendation:           - Discharge patient to home (ambulatory).                           - Resume previous diet.                           - Await pathology results.                           - Repeat colonoscopy in 1 year for surveillance.                           - No diclofenac, BC/Goody powders, Voltaren,                            ibuprofen, naproxen, or other non-steroidal                            anti-inflammatory drugs indefinitely. Procedure Code(s):        --- Professional ---                           [phone], Colonoscopy, flexible; with transendoscopic                            balloon dilation Diagnosis Code(s):        --- Professional ---                           H06.237, Other intestinal obstruction unspecified                            as to partial versus complete obstruction  D50.9, Iron deficiency anemia, unspecified                           K57.30, Diverticulosis of large intestine without                            perforation or abscess without bleeding CPT copyright 2019 American Medical Association. All rights reserved. The codes documented in this report are preliminary and upon coder review may  be revised to meet current compliance requirements. Katrinka Blazing, MD Katrinka Blazing,  05/23/2021 11:29:07 AM This report has been signed electronically. Number of Addenda: 0

## 2021-05-23 NOTE — Transfer of Care (Signed)
Immediate Anesthesia Transfer of Care Note  Patient: Shawna Lewis  Procedure(s) Performed: COLONOSCOPY WITH PROPOFOL ESOPHAGOGASTRODUODENOSCOPY (EGD) WITH PROPOFOL BIOPSY BALLOON DILATION  Patient Location: Endoscopy Unit  Anesthesia Type:General  Level of Consciousness: awake  Airway & Oxygen Therapy: Patient Spontanous Breathing  Post-op Assessment: Report given to RN  Post vital signs: Reviewed  Last Vitals:  Vitals Value Taken Time  BP    Temp    Pulse    Resp    SpO2      Last Pain:  Vitals:   05/23/21 1028  TempSrc:   PainSc: 0-No pain      Patients Stated Pain Goal: 2 (05/23/21 0856)  Complications: No notable events documented.

## 2021-05-23 NOTE — Anesthesia Preprocedure Evaluation (Signed)
Anesthesia Evaluation  Patient identified by MRN, date of birth, ID band Patient awake    Reviewed: Allergy & Precautions, H&P , NPO status , Patient's Chart, lab work & pertinent test results, reviewed documented beta blocker date and time   Airway Mallampati: II  TM Distance: >3 FB Neck ROM: full    Dental no notable dental hx.    Pulmonary neg pulmonary ROS, former smoker,    Pulmonary exam normal breath sounds clear to auscultation       Cardiovascular Exercise Tolerance: Good hypertension, negative cardio ROS   Rhythm:regular Rate:Normal     Neuro/Psych  Headaches,  Neuromuscular disease negative psych ROS   GI/Hepatic negative GI ROS, Neg liver ROS,   Endo/Other  negative endocrine ROS  Renal/GU negative Renal ROS  negative genitourinary   Musculoskeletal   Abdominal   Peds  Hematology negative hematology ROS (+) Blood dyscrasia, anemia ,   Anesthesia Other Findings   Reproductive/Obstetrics negative OB ROS                             Anesthesia Physical Anesthesia Plan  ASA: 2  Anesthesia Plan: General   Post-op Pain Management:    Induction:   PONV Risk Score and Plan: Propofol infusion  Airway Management Planned:   Additional Equipment:   Intra-op Plan:   Post-operative Plan:   Informed Consent: I have reviewed the patients History and Physical, chart, labs and discussed the procedure including the risks, benefits and alternatives for the proposed anesthesia with the patient or authorized representative who has indicated his/her understanding and acceptance.     Dental Advisory Given  Plan Discussed with: CRNA  Anesthesia Plan Comments:         Anesthesia Quick Evaluation

## 2021-05-23 NOTE — Op Note (Addendum)
[facility] Patient Name: Shawna Lewis Procedure Date: 05/23/2021 10:12 AM MRN: [id number] Date of Birth: 09-12-1950 Attending MD: Katrinka Blazing ,  CSN: [id number] Age: 70 Admit Type: Outpatient Procedure:                Upper GI endoscopy Indications:              Iron deficiency anemia Providers:                Katrinka Blazing, Angelica Ran, Kristine L. Jessee Avers, Technician Referring MD:              Medicines:                Monitored Anesthesia Care Complications:            No immediate complications. Estimated Blood Loss:     Estimated blood loss: none. Procedure:                Pre-Anesthesia Assessment:                           - Prior to the procedure, a History and Physical                            was performed, and patient medications, allergies                            and sensitivities were reviewed. The patient's                            tolerance of previous anesthesia was reviewed.                           - The risks and benefits of the procedure and the                            sedation options and risks were discussed with the                            patient. All questions were answered and informed                            consent was obtained.                           - ASA Grade Assessment: II - A patient with mild                            systemic disease.                           After obtaining informed consent, the endoscope was                            passed under direct vision. Throughout the  procedure, the patient's blood pressure, pulse, and                            oxygen saturations were monitored continuously. The                            GIF-H190 ([id number]) scope was introduced through the                            mouth, and advanced to the second part of duodenum.                            The upper GI endoscopy was accomplished without                             difficulty. The patient tolerated the procedure                            well. Scope In: 10:35:21 AM Scope Out: 10:41:22 AM Total Procedure Duration: 0 hours 6 minutes 1 second  Findings:      A 1 cm hiatal hernia was present.      The entire examined stomach was normal.      The examined duodenum was normal. Biopsies were taken with a cold       forceps for histology. Impression:               - 1 cm hiatal hernia.                           - Normal stomach.                           - Normal examined duodenum. Biopsied. Moderate Sedation:      Per Anesthesia Care Recommendation:           - Discharge patient to home (ambulatory).                           - Resume previous diet.                           - Await pathology results.                           - Continue oral omeprazole 20 mg qday.                           - Try to minimize diclofenac use.                           - Follow up in GI clinic in 3 months with repeat                            CBC and iron stores. Procedure Code(s):        --- Professional ---  54008, Esophagogastroduodenoscopy, flexible,                            transoral; with biopsy, single or multiple Diagnosis Code(s):        --- Professional ---                           K44.9, Diaphragmatic hernia without obstruction or                            gangrene                           D50.9, Iron deficiency anemia, unspecified CPT copyright 2019 American Medical Association. All rights reserved. The codes documented in this report are preliminary and upon coder review may  be revised to meet current compliance requirements. Katrinka Blazing, MD Katrinka Blazing,  05/23/2021 10:45:58 AM This report has been signed electronically. Number of Addenda: 0

## 2021-05-26 LAB — SURGICAL PATHOLOGY

## 2021-05-28 ENCOUNTER — Encounter (HOSPITAL_COMMUNITY): Payer: Self-pay | Admitting: Gastroenterology

## 2021-05-30 ENCOUNTER — Encounter (INDEPENDENT_AMBULATORY_CARE_PROVIDER_SITE_OTHER): Payer: Self-pay

## 2021-08-25 IMAGING — MG MM DIGITAL SCREENING BILAT W/ TOMO AND CAD
6 of 12 series · 6 of 36 positions shown · non-contrast
Comparison: Previous exam(s).

CLINICAL DATA: Screening.

EXAM:
DIGITAL SCREENING BILATERAL MAMMOGRAM WITH TOMOSYNTHESIS AND CAD
TECHNIQUE: Bilateral screening digital craniocaudal and mediolateral oblique
mammograms were obtained. Bilateral screening digital breast
tomosynthesis was performed. The images were evaluated with
computer-aided detection.

[R MLO synth-2D (1 of 2)]
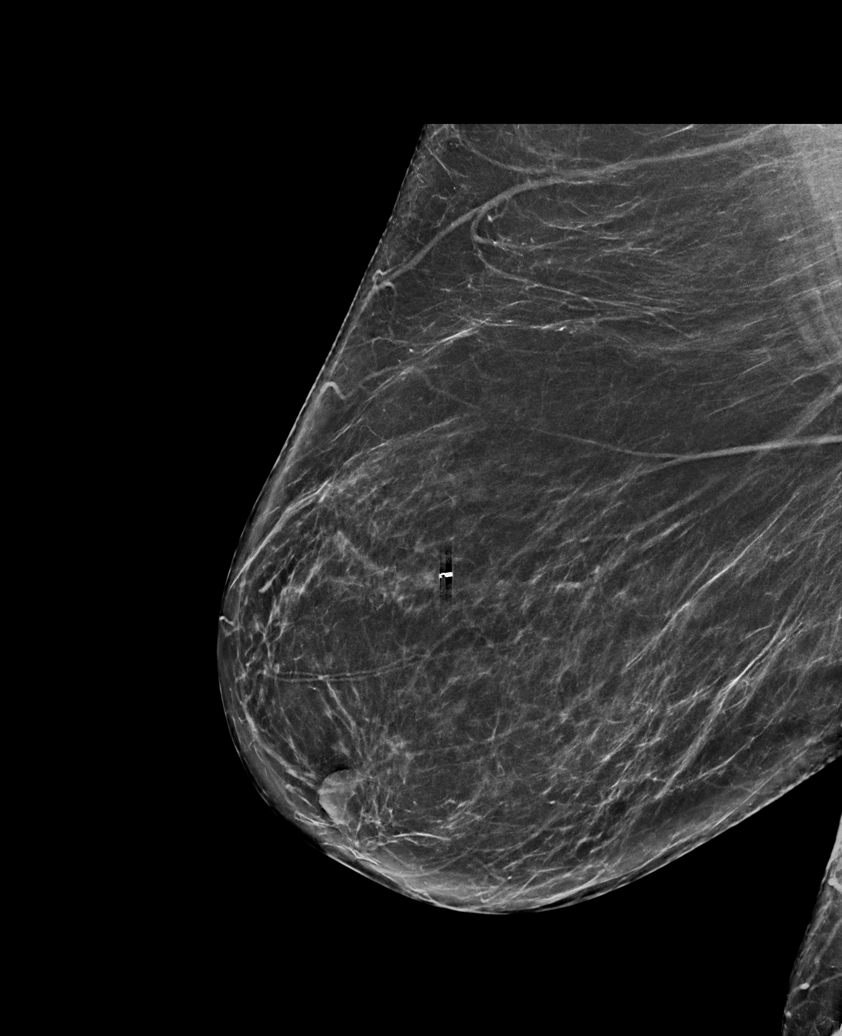

[R MLO synth-2D (2 of 2)]
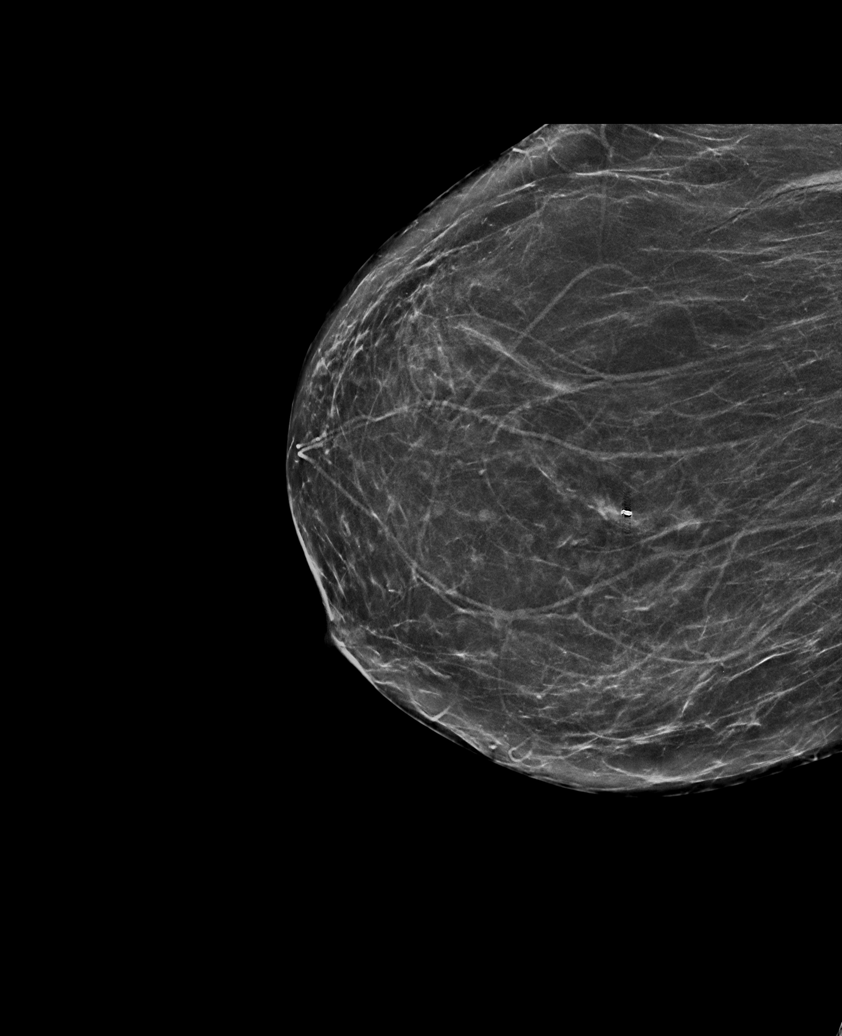

[L CC synth-2D (1 of 2)]
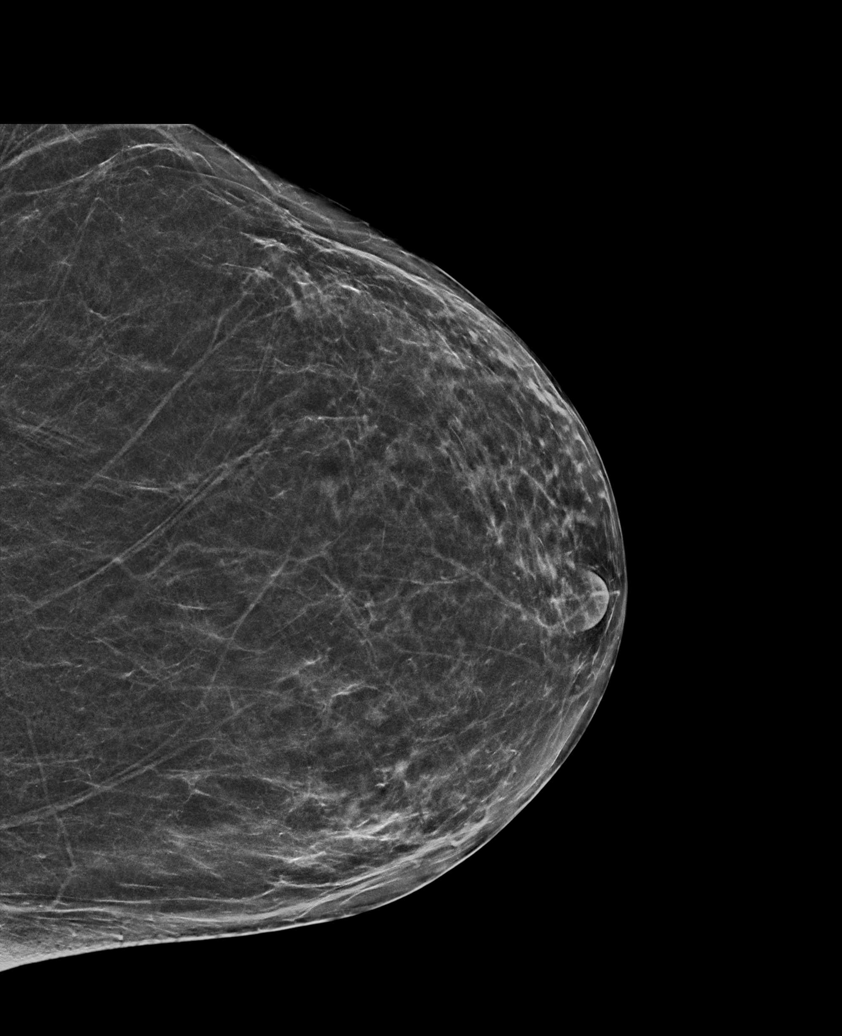

[L CC synth-2D (2 of 2)]
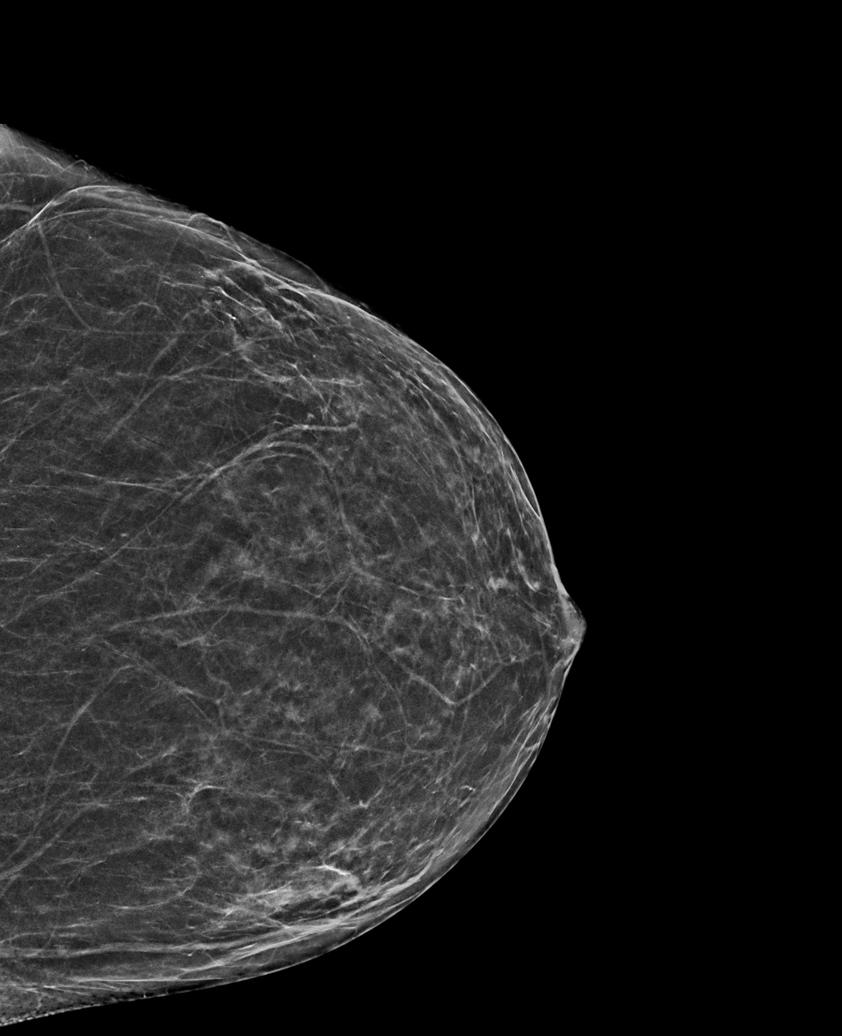

[L MLO synth-2D]
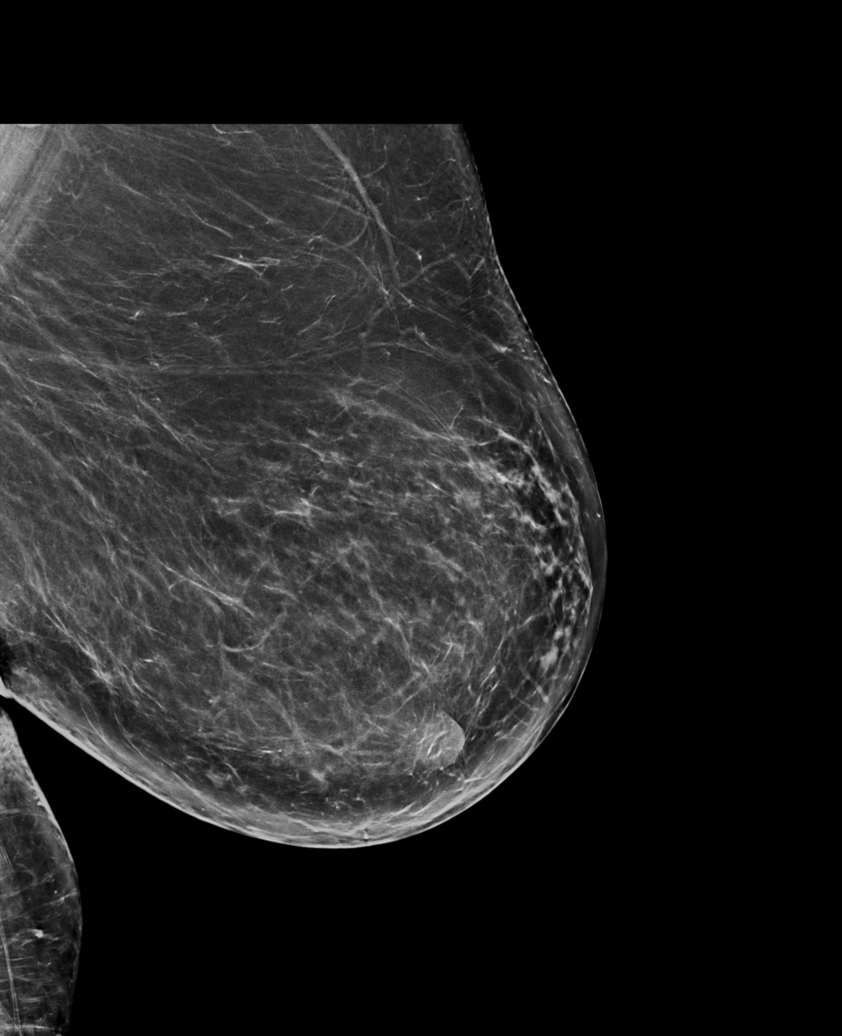

[R CC synth-2D]
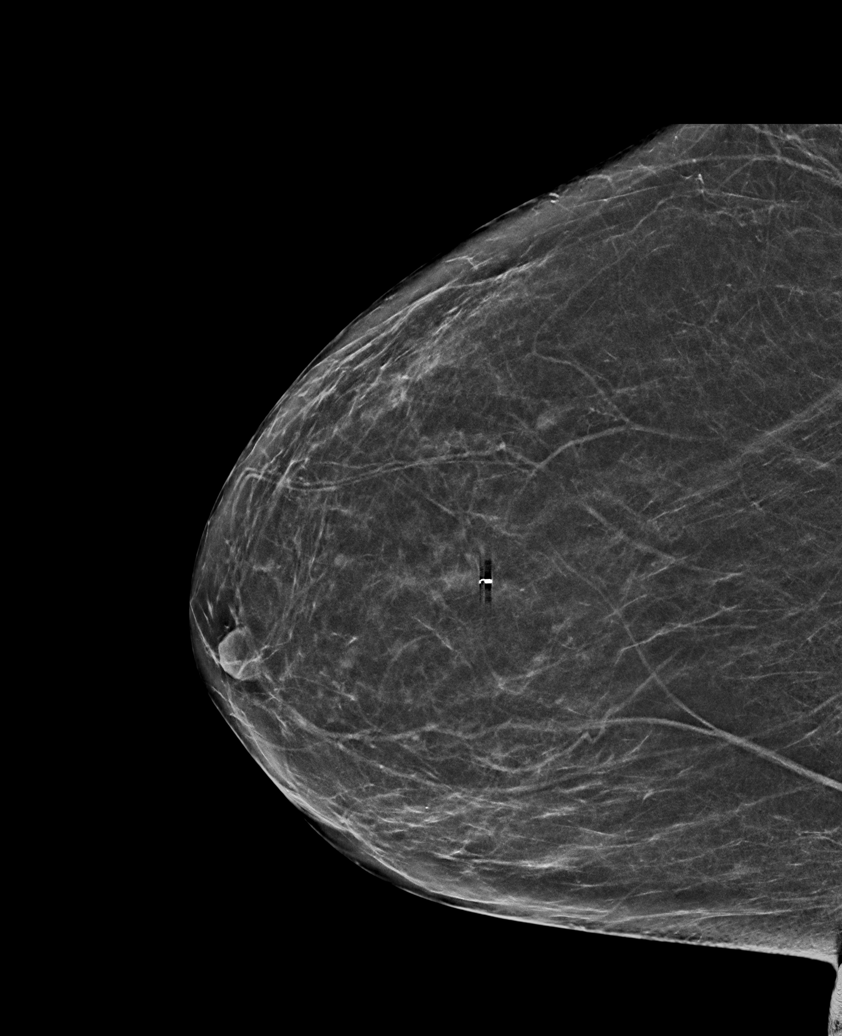

[6 of 36 positions shown; findings below may reference images not displayed]

ACR Breast Density Category b: There are scattered areas of
fibroglandular density.
FINDINGS: There are no findings suspicious for malignancy.
IMPRESSION: No mammographic evidence of malignancy. A result letter of this
screening mammogram will be mailed directly to the patient.

RECOMMENDATION:
Screening mammogram in one year. (Code:51-O-LD2)

BI-RADS CATEGORY  1: Negative.

## 2021-09-03 ENCOUNTER — Ambulatory Visit (INDEPENDENT_AMBULATORY_CARE_PROVIDER_SITE_OTHER): Payer: Medicare PPO | Admitting: Gastroenterology

## 2021-09-29 ENCOUNTER — Ambulatory Visit (INDEPENDENT_AMBULATORY_CARE_PROVIDER_SITE_OTHER): Payer: Medicare PPO | Admitting: Gastroenterology

## 2021-11-11 ENCOUNTER — Ambulatory Visit (INDEPENDENT_AMBULATORY_CARE_PROVIDER_SITE_OTHER): Payer: Medicare PPO | Admitting: Gastroenterology

## 2021-11-11 ENCOUNTER — Encounter (INDEPENDENT_AMBULATORY_CARE_PROVIDER_SITE_OTHER): Payer: Self-pay | Admitting: Gastroenterology

## 2021-11-11 VITALS — BP 158/81 | HR 85 | Temp 98.2°F | Ht 61.0 in | Wt 204.2 lb

## 2021-11-11 DIAGNOSIS — D649 Anemia, unspecified: Secondary | ICD-10-CM

## 2021-11-11 NOTE — Patient Instructions (Signed)
Please continue omeprazole 20mg  once daily and iron pills once daily at this time ?We will plan to repeat colonoscopy in nov 2023 ?Please let me know if you have any new or worsening GI symptoms in the meantime ?Please avoid NSAIDs (advil, aleve, naproxen, goody powder, ibuprofen) as these can be very hard on your GI tract, causing inflammation, ulcers and damage to the lining of your GI tract.  ? ?Follow up 1 year ?

## 2021-11-11 NOTE — Progress Notes (Signed)
? ?Referring Provider: Celene Squibb, MD ?Primary Care Physician:  Celene Squibb, MD ?Primary GI Physician: Jenetta Downer ? ?Chief Complaint  ?Patient presents with  ? Follow-up  ?  Patient here today for a follow up. She had a tcs November 2022. She denies any current issues.    ? ?HPI:   ?Shawna Lewis is a 71 y.o. female with past medical history of fibromyalgia, hypertension, degenerative disc disease, arthritis, migraine, and anemia. ? ?Patient presenting today for follow up of anemia, stricture of proximal ascending and mid ascending colon. ? ?Last seen October 2022 for positive fecal occult blood test. At that time she reported drop in hgb in September 2022 to 8.5 range, advised to stop her diclofenac, but had to restart at lower dose due to recurrent pain. Started on iron 04/01/21 and was taking famotidine 20mg  nightly. Reported chronic epigastric pain, worse with stress with some nausea as well. Upon review of labs, anemia present at least since 2020 based on the labs available in chart. hemoglobin has fluctuated between 9 and 12 with lowest value of 9.1 in January 2021. iron stores in 06/24/2019 showed normal iron of 53, iron saturation of 16%, ferritin of 76 which were within normal limits. hgb at time of OV was 10.4. she was scheduled to undergo EGD and Colonoscopy thereafter for further evaluation, findings as listed below. Also started on omeprazole 20mg  daily and famotidine d/ced.  ? ?Today, patient states she Had labs done on march 20th with hgb of 13. Pt states that her PCP stopped her diclofenac completely  as they were concerned this could be contributing to her anemia. Is maintained on omeprazole 20mg  daily and ferrous sulfate 325mg  daily. Denies any abdominal pain, nausea, vomiting, changes in appetite or weight loss. No rectal bleeding, melena, constipation or diarrhea. Denies sob or dizziness. Given presence of stricture in proximal and mid ascending colon, recommended to have repeat colonoscopy in  November 2023. ? ?Last Colonoscopy:05/23/21 Diverticulosis in the ascending colon. 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 Dilated. ?- The distal rectum and anal verge are normal on retroflexion view. ?- No specimens collected. ?Last Endoscopy:-05/23/21 1 cm hiatal hernia. ?- Normal stomach. ?- Normal examined duodenum. Biopsied-normal ? ?Recommendations:  ?Repeat colonoscopy in November 2023 ? ?Past Medical History:  ?Diagnosis Date  ? Anemia   ? Arthritis   ? DDD (degenerative disc disease), lumbar   ? Fibromyalgia   ? Heart murmur   ? Hypertension   ? Migraine   ? ? ?Past Surgical History:  ?Procedure Laterality Date  ? ANKLE FRACTURE SURGERY  2018  ? BALLOON DILATION  05/23/2021  ? Procedure: BALLOON DILATION;  Surgeon: Montez Morita, Quillian Quince, MD;  Location: [facility];  Service: Gastroenterology;;  ascending colon strictures  ? BIOPSY  05/23/2021  ? Procedure: BIOPSY;  Surgeon: Montez Morita, Quillian Quince, MD;  Location: [facility];  Service: Gastroenterology;;  ? [place]  ? CATARACT EXTRACTION    ? [place]  ? COLONOSCOPY WITH PROPOFOL N/A 05/23/2021  ? Procedure: COLONOSCOPY WITH PROPOFOL;  Surgeon: Harvel Quale, MD;  Location: [facility];  Service: Gastroenterology;  Laterality: N/A;  10:10  ? ESOPHAGOGASTRODUODENOSCOPY (EGD) WITH PROPOFOL N/A 05/23/2021  ? Procedure: ESOPHAGOGASTRODUODENOSCOPY (EGD) WITH PROPOFOL;  Surgeon: Harvel Quale, MD;  Location: [facility];  Service: Gastroenterology;  Laterality: N/A;  ? IR LUMBAR [place] W/IMG GUIDE  06/22/2019  ? KNEE ARTHROSCOPY W/  MENISCAL REPAIR    ? TONSILLECTOMY  1974  ? ? ?Current Outpatient Medications  ?Medication Sig Dispense Refill  ? acetaminophen (TYLENOL) 500 MG tablet Take 750 mg by mouth every 6 (six) hours as needed for moderate pain.    ? acyclovir (ZOVIRAX) 800 MG tablet Take 800 mg by mouth daily as needed (breakouts).    ? baclofen (LIORESAL)  20 MG tablet Take 20 mg by mouth 3 (three) times daily.    ? felodipine (PLENDIL) 5 MG 24 hr tablet Take 5 mg by mouth daily.    ? ferrous sulfate 325 (65 FE) MG tablet Take 325 mg by mouth daily with breakfast.    ? fluvoxaMINE (LUVOX) 100 MG tablet Take 100 mg by mouth daily. Pt can take an additional dose if needed    ? furosemide (LASIX) 20 MG tablet Take 20 mg by mouth daily as needed (swelling/fluid).    ? losartan (COZAAR) 50 MG tablet Take 50 mg by mouth daily.    ? Multiple Vitamins-Minerals (MULTIVITAMIN WITH MINERALS) tablet Take 1 tablet by mouth daily.    ? Omega-3 Fatty Acids (OMEGA 3 500 PO) Take 1,000 mg by mouth. Once per day    ? omeprazole (PRILOSEC) 20 MG capsule Take 1 capsule (20 mg total) by mouth daily. 90 capsule 3  ? Oxycodone HCl 10 MG TABS Take 10 mg by mouth 6 (six) times daily.    ? oxymetazoline (AFRIN) 0.05 % nasal spray Place 1 spray into both nostrils 2 (two) times daily as needed for congestion.    ? PHENobarbital (LUMINAL) 64.8 MG tablet Take 97.2 mg by mouth daily as needed (migraine). 1.5 tabs at onset of migraine as needed    ? pramipexole (MIRAPEX) 0.25 MG tablet Take 0.25 mg by mouth daily.    ? Probiotic Product (PROBIOTIC ADVANCED PO) Take 1 capsule by mouth daily. Also includes prebiotic.    ? simethicone (MYLICON) 0000000 MG chewable tablet Chew 125 mg by mouth every 6 (six) hours as needed for flatulence.    ? Turmeric (QC TUMERIC COMPLEX PO) Take by mouth. Two capsules per day.    ? ?No current facility-administered medications for this visit.  ? ? ?Allergies as of 11/11/2021  ? (No Known Allergies)  ? ? ?Family History  ?Problem Relation Age of Onset  ? COPD Mother   ? Heart disease Mother   ? Lymphoma Mother   ? Migraines Mother   ? Pancreatic cancer Father   ? Neuropathy Neg Hx   ? ? ?Social History  ? ?Socioeconomic History  ? Marital status: Married  ?  Spouse name: Not on file  ? Number of children: 1  ? Years of education: Master's  ? Highest education level: Not on  file  ?Occupational History  ? Occupation: [employer]  ?Tobacco Use  ? Smoking status: Former  ? Smokeless tobacco: Never  ?Vaping Use  ? Vaping Use: Never used  ?Substance and Sexual Activity  ? Alcohol use: Not Currently  ? Drug use: No  ? Sexual activity: Not on file  ?Other Topics Concern  ? Not on file  ?Social History Narrative  ? Lives at home w/ her husband  ? Right-handed  ? Caffeine: 0-1 cup of tea per day  ? ?Social Determinants of Health  ? ?Financial Resource Strain: Not on file  ?Food Insecurity: Not on file  ?Transportation Needs: Not on file  ?Physical Activity: Not on file  ?Stress: Not on file  ?Social  Connections: Not on file  ? ?Review of systems ?General: negative for malaise, night sweats, fever, chills, weight loss ?Neck: Negative for lumps, goiter, pain and significant neck swelling ?Resp: Negative for cough, wheezing, dyspnea at rest ?CV: Negative for chest pain, leg swelling, palpitations, orthopnea ?GI: denies melena, hematochezia, nausea, vomiting, diarrhea, constipation, dysphagia, odyonophagia, early satiety or unintentional weight loss.  ?MSK: Negative for joint pain or swelling, back pain, and muscle pain. ?Derm: Negative for itching or rash ?Psych: Denies depression, anxiety, memory loss, confusion. No homicidal or suicidal ideation.  ?Heme: Negative for prolonged bleeding, bruising easily, and swollen nodes. ?Endocrine: Negative for cold or heat intolerance, polyuria, polydipsia and goiter. ?Neuro: negative for tremor, gait imbalance, syncope and seizures. ?The remainder of the review of systems is noncontributory. ? ?Physical Exam: ?BP (!) 158/81 (BP Location: Left Arm, Patient Position: Sitting, Cuff Size: Large)   Pulse 85   Temp 98.2 ?F (36.8 ?C) (Oral)   Ht 5\' 1"  (1.549 m)   Wt 204 lb 3.2 oz (92.6 kg)   BMI 38.58 kg/m?  ?General:   Alert and oriented. No distress noted. Pleasant and cooperative.  ?Head:  Normocephalic and atraumatic. ?Eyes:  Conjuctiva  clear without scleral icterus. ?Mouth:  Oral mucosa pink and moist. Good dentition. No lesions. ?Heart: Normal rate and rhythm, s1 and s2 heart sounds present.  ?Lungs: Clear lung sounds in all lobes. Respi

## 2021-11-17 NOTE — H&P (Signed)
KNEE ARTHROPLASTY ADMISSION H&P ? ?Patient ID: ?Shawna Lewis ?MRN: [id number] ?DOB/AGE: 07-31-1950 71 y.o. ? ?Chief Complaint: right knee pain. ? ?Planned Procedure Date: 12/09/21 ?Medical and Cardiac Clearance by Dr. Catalina Pizza   ?PM&R clearance by Merryl Hacker NP-C who wants [place] to manage post-op acute pain ? ?HPI: ?Shawna Lewis is a 71 y.o. female who presents for evaluation of OASTEOARTHRITIS RIGHT KNEE. The patient has a history of pain and functional disability in the right knee due to arthritis and has failed non-surgical conservative treatments for greater than 12 weeks to include NSAID's and/or analgesics, corticosteriod injections, viscosupplementation injections, use of assistive devices, and activity modification.  Onset of symptoms was gradual, starting 5 years ago with gradually worsening course since that time. The patient noted no past surgery on the right knee.  Patient currently rates pain at 8 out of 10 with activity. Patient has night pain, worsening of pain with activity and weight bearing, and pain that interferes with activities of daily living.  Patient has evidence of subchondral sclerosis, periarticular osteophytes, and joint space narrowing by imaging studies.  There is no active infection. ? ?Past Medical History:  ?Diagnosis Date  ? Anemia   ? Arthritis   ? DDD (degenerative disc disease), lumbar   ? Fibromyalgia   ? Heart murmur   ? Hypertension   ? Migraine   ? ?Past Surgical History:  ?Procedure Laterality Date  ? ANKLE FRACTURE SURGERY  2018  ? BALLOON DILATION  05/23/2021  ? Procedure: BALLOON DILATION;  Surgeon: Marguerita Merles, Reuel Boom, MD;  [facility]: [facility];  Service: Gastroenterology;;  ascending colon strictures  ? BIOPSY  05/23/2021  ? Procedure: BIOPSY;  Surgeon: Marguerita Merles, Reuel Boom, MD;  Location: [facility];  Service: Gastroenterology;;  ? BREAST BIOPSY  1995  ? CATARACT EXTRACTION    ? CESAREAN SECTION  1986  ? COLONOSCOPY WITH PROPOFOL N/A 05/23/2021  ?  Procedure: COLONOSCOPY WITH PROPOFOL;  Surgeon: Dolores Frame, MD;  Location: [facility];  Service: Gastroenterology;  Laterality: N/A;  10:10  ? ESOPHAGOGASTRODUODENOSCOPY (EGD) WITH PROPOFOL N/A 05/23/2021  ? Procedure: ESOPHAGOGASTRODUODENOSCOPY (EGD) WITH PROPOFOL;  Surgeon: Dolores Frame, MD;  Location: [facility];  Service: Gastroenterology;  Laterality: N/A;  ? IR LUMBAR DISC ASPIRATION W/IMG GUIDE  06/22/2019  ? KNEE ARTHROSCOPY W/ MENISCAL REPAIR    ? TONSILLECTOMY  1974  ? ?No Known Allergies ?Prior to Admission medications   ?Medication Sig Start Date End Date Taking? Authorizing Provider  ?acetaminophen (TYLENOL) 500 MG tablet Take 750 mg by mouth every 6 (six) hours as needed for moderate pain.    [provider]  ?acyclovir (ZOVIRAX) 800 MG tablet Take 800 mg by mouth daily as needed (breakouts).    [provider]  ?baclofen (LIORESAL) 20 MG tablet Take 20 mg by mouth 3 (three) times daily. 06/10/19   [provider]  ?felodipine (PLENDIL) 5 MG 24 hr tablet Take 5 mg by mouth daily. 12/04/19   [provider]  ?ferrous sulfate 325 (65 FE) MG tablet Take 325 mg by mouth daily with breakfast.    [provider]  ?fluvoxaMINE (LUVOX) 100 MG tablet Take 100 mg by mouth daily. Pt can take an additional dose if needed 10/05/16   [provider]  ?furosemide (LASIX) 20 MG tablet Take 20 mg by mouth daily as needed (swelling/fluid).    [provider]  ?losartan (COZAAR) 50 MG tablet Take 50 mg by mouth daily. 01/16/20  [provider]  ?Multiple Vitamins-Minerals (MULTIVITAMIN WITH MINERALS) tablet Take 1 tablet by mouth daily.    [provider]  ?Omega-3 Fatty Acids (OMEGA 3 500 PO) Take 1,000 mg by mouth. Once per day    [provider]  ?omeprazole (PRILOSEC) 20 MG capsule Take 1 capsule (20 mg total) by mouth daily. 05/14/21   Dolores Frameastaneda Mayorga, Daniel, MD  ?Oxycodone HCl 10 MG TABS Take  10 mg by mouth 6 (six) times daily. 10/12/16   [provider]  ?oxymetazoline (AFRIN) 0.05 % nasal spray Place 1 spray into both nostrils 2 (two) times daily as needed for congestion.    [provider]  ?PHENobarbital (LUMINAL) 64.8 MG tablet Take 97.2 mg by mouth daily as needed (migraine). 1.5 tabs at onset of migraine as needed    [provider]  ?pramipexole (MIRAPEX) 0.25 MG tablet Take 0.25 mg by mouth daily.    [provider]  ?Probiotic Product (PROBIOTIC ADVANCED PO) Take 1 capsule by mouth daily. Also includes prebiotic.    [provider]  ?simethicone (MYLICON) 125 MG chewable tablet Chew 125 mg by mouth every 6 (six) hours as needed for flatulence.    [provider]  ?Turmeric (QC TUMERIC COMPLEX PO) Take by mouth. Two capsules per day.    [provider]  ? ?Social History  ? ?Socioeconomic History  ? Marital status: Married  ?  Spouse name: Not on file  ? Number of children: 1  ? Years of education: Master's  ? Highest education level: Not on file  ?Occupational History  ? Occupation: [employer]  ?Tobacco Use  ? Smoking status: Former  ? Smokeless tobacco: Never  ?Vaping Use  ? Vaping Use: Never used  ?Substance and Sexual Activity  ? Alcohol use: Not Currently  ? Drug use: No  ? Sexual activity: Not on file  ?Other Topics Concern  ? Not on file  ?Social History Narrative  ? Lives at home w/ her husband  ? Right-handed  ? Caffeine: 0-1 cup of tea per day  ? ?Social Determinants of Health  ? ?Financial Resource Strain: Not on file  ?Food Insecurity: Not on file  ?Transportation Needs: Not on file  ?Physical Activity: Not on file  ?Stress: Not on file  ?Social Connections: Not on file  ? ?Family History  ?Problem Relation Age of Onset  ? COPD Mother   ? Heart disease Mother   ? Lymphoma Mother   ? Migraines Mother   ? Pancreatic cancer Father   ? Neuropathy Neg Hx   ? ? ?ROS: Currently denies lightheadedness, dizziness,  Fever, chills, CP, SOB.   ?No personal history of DVT, PE, MI, or CVA. ?No loose teeth or dentures ?All other systems have been reviewed and were otherwise currently negative with the exception of those mentioned in the HPI and as above. ? ?Objective: ?Vitals: Ht: 5'2" Wt: 200.8 lbs Temp: 97.8 BP: 155/85 Pulse: 63 O2 98% on room air.   ?Physical Exam: ?General: Alert, NAD.  Antalgic Gait  ?HEENT: EOMI, Good Neck Extension  ?Pulm: No increased work of breathing.  Clear B/L A/P w/o crackle or wheeze.  ?CV: RRR, No m/g/r appreciated  ?GI: soft, NT, ND. BS x 4 quadrants ?Neuro: CN II-XII grossly intact without focal deficit.  Sensation intact distally ?Skin: No lesions in the area of chief complaint ?MSK/Surgical Site:  + JLT. ROM 40-95 degrees. Decreased strength in extension and flexion. B/L lower leg non-pitting edema. +EHL/FHL.  NVI.  Stable varus and valgus stress.  ? ? ?Imaging Review ?Plain radiographs demonstrate severe degenerative joint disease of the bilateral knees.  ? ?The overall alignment ismild valgus. The bone quality appears to be fair for age and reported activity level. ? ?Preoperative templating of the joint replacement has been completed, documented, and submitted to the Operating Room personnel in order to optimize intra-operative equipment management. ? ?Assessment: ?OASTEOARTHRITIS RIGHT KNEE ?Active Problems: ?  * No active hospital problems. * ? ? ?Plan: ?Plan for Procedure(s): ?TOTAL KNEE ARTHROPLASTY ? ?The patient history, physical exam, clinical judgement of the provider and imaging are consistent with end stage degenerative joint disease and total joint arthroplasty is deemed medically necessary. The treatment options including medical management, injection therapy, and arthroplasty were discussed at length. The risks and benefits of Procedure(s): ?TOTAL KNEE ARTHROPLASTY were presented and reviewed.  ?The risks of nonoperative treatment, versus surgical intervention including but not  limited to continued pain, aseptic loosening, stiffness, dislocation/subluxation, infection, bleeding, nerve injury, blood clots, cardiopulmonary complications, morbidity, mortality, among others were d

## 2021-11-21 NOTE — Patient Instructions (Addendum)
DUE TO COVID-19 ONLY TWO VISITORS  (aged 69 and older)  ARE ALLOWED TO COME WITH YOU AND STAY IN THE WAITING ROOM ONLY DURING PRE OP AND PROCEDURE.   ?**NO VISITORS ARE ALLOWED IN THE SHORT STAY AREA OR RECOVERY ROOM!!** ? ?IF YOU WILL BE ADMITTED INTO THE HOSPITAL YOU ARE ALLOWED ONLY FOUR SUPPORT PEOPLE DURING VISITATION HOURS ONLY (7 AM -8PM)   ?The support person(s) must pass our screening, gel in and out, and wear a mask at all times, including in the patient?s room. ?Patients must also wear a mask when staff or their support person are in the room. ?Visitors GUEST BADGE MUST BE WORN VISIBLY  ?One adult visitor may remain with you overnight and MUST be in the room by 8 P.M. ?  ? ? Your procedure is scheduled on: 12/09/21 ? ? Report to [facility] Main Entrance ? ?  Report to admitting at 7:30 AM ? ? Call this number if you have problems the morning of surgery [phone] ? ? Do not eat food :After Midnight. ? ? After Midnight you may have the following liquids until 7:00 AM DAY OF SURGERY ? ?Water ?Black Coffee (sugar ok, NO MILK/CREAM OR CREAMERS)  ?Tea (sugar ok, NO MILK/CREAM OR CREAMERS) regular and decaf                             ?Plain Jell-O (NO RED)                                           ?Fruit ices (not with fruit pulp, NO RED)                                     ?Popsicles (NO RED)                                                                  ?Juice: apple, WHITE grape, WHITE cranberry ?Sports drinks like Gatorade (NO RED) ?Clear broth(vegetable,chicken,beef) ?  ?  ?The day of surgery:  ?Drink ONE (1) Pre-Surgery Clear Ensure at 7:00 AM the morning of surgery. Drink in one sitting. Do not sip.  ?This drink was given to you during your hospital  ?pre-op appointment visit. ?Nothing else to drink after completing the  ?Pre-Surgery Clear Ensure. ?  ?       If you have questions, please contact your surgeon?s office. ? ? ?FOLLOW BOWEL PREP AND ANY ADDITIONAL PRE OP INSTRUCTIONS YOU  RECEIVED FROM YOUR SURGEON'S OFFICE!!! ?  ?  ?Oral Hygiene is also important to reduce your risk of infection.                                    ?Remember - BRUSH YOUR TEETH THE MORNING OF SURGERY WITH YOUR REGULAR TOOTHPASTE ? ? Take these medicines the morning of surgery with A SIP OF WATER: Tylenol, Omeprazole, Oxycodone.  ?                  ?  You may not have any metal on your body including hair pins, jewelry, and body piercing ? ?           Do not wear make-up, lotions, powders, perfumes, or deodorant ? ?Do not wear nail polish including gel and S&S, artificial/acrylic nails, or any other type of covering on natural nails including finger and toenails. If you have artificial nails, gel coating, etc. that needs to be removed by a nail salon please have this removed prior to surgery or surgery may need to be canceled/ delayed if the surgeon/ anesthesia feels like they are unable to be safely monitored.  ? ?Do not shave  48 hours prior to surgery.  ? ? Do not bring valuables to the hospital. [facility] IS NOT ?            RESPONSIBLE   FOR VALUABLES. ? ? Bring small overnight bag day of surgery. ? ?            Please read over the following fact sheets you were given: IF YOU HAVE QUESTIONS ABOUT YOUR PRE-OP INSTRUCTIONS PLEASE CALL [phone]- [place] ? ?   [facility] - Preparing for Surgery ?Before surgery, you can play an important role.  Because skin is not sterile, your skin needs to be as free of germs as possible.  You can reduce the number of germs on your skin by washing with CHG (chlorahexidine gluconate) soap before surgery.  CHG is an antiseptic cleaner which kills germs and bonds with the skin to continue killing germs even after washing. ?Please DO NOT use if you have an allergy to CHG or antibacterial soaps.  If your skin becomes reddened/irritated stop using the CHG and inform your nurse when you arrive at Short Stay. ?Do not shave (including legs and underarms) for at least 48 hours  prior to the first CHG shower.  You may shave your face/neck. ? ?Please follow these instructions carefully: ? 1.  Shower with CHG Soap the night before surgery and the  morning of surgery. ? 2.  If you choose to wash your hair, wash your hair first as usual with your normal  shampoo. ? 3.  After you shampoo, rinse your hair and body thoroughly to remove the shampoo.                            ? 4.  Use CHG as you would any other liquid soap.  You can apply chg directly to the skin and wash.  Gently with a scrungie or clean washcloth. ? 5.  Apply the CHG Soap to your body ONLY FROM THE NECK DOWN.   Do   not use on face/ open      ?                     Wound or open sores. Avoid contact with eyes, ears mouth and   genitals (private parts).  ?                     Engineering geologist,  Genitals (private parts) with your normal soap. ?            6.  Wash thoroughly, paying special attention to the area where your    surgery  will be performed. ? 7.  Thoroughly rinse your body with warm water from the neck down. ? 8.  DO NOT shower/wash with your normal  soap after using and rinsing off the CHG Soap. ?               9.  Pat yourself dry with a clean towel. ?           10.  Wear clean pajamas. ?           11.  Place clean sheets on your bed the night of your first shower and do not  sleep with pets. ?Day of Surgery : ?Do not apply any lotions/deodorants the morning of surgery.  Please wear clean clothes to the hospital/surgery center. ? ?FAILURE TO FOLLOW THESE INSTRUCTIONS MAY RESULT IN THE CANCELLATION OF YOUR SURGERY ? ?PATIENT SIGNATURE_________________________________ ? ?NURSE SIGNATURE__________________________________ ? ?________________________________________________________________________  ? ?Incentive Spirometer ? ?An incentive spirometer is a tool that can help keep your lungs clear and active. This tool measures how well you are filling your lungs with each breath. Taking long deep breaths may help reverse or decrease  the chance of developing breathing (pulmonary) problems (especially infection) following: ?A long period of time when you are unable to move or be active. ?BEFORE THE PROCEDURE  ?If the spirometer includes an indicator to show your best effort, your nurse or respiratory therapist will set it to a desired goal. ?If possible, sit up straight or lean slightly forward. Try not to slouch. ?Hold the incentive spirometer in an upright position. ?INSTRUCTIONS FOR USE  ?Sit on the edge of your bed if possible, or sit up as far as you can in bed or on a chair. ?Hold the incentive spirometer in an upright position. ?Breathe out normally. ?Place the mouthpiece in your mouth and seal your lips tightly around it. ?Breathe in slowly and as deeply as possible, raising the piston or the ball toward the top of the column. ?Hold your breath for 3-5 seconds or for as long as possible. Allow the piston or ball to fall to the bottom of the column. ?Remove the mouthpiece from your mouth and breathe out normally. ?Rest for a few seconds and repeat Steps 1 through 7 at least 10 times every 1-2 hours when you are awake. Take your time and take a few normal breaths between deep breaths. ?The spirometer may include an indicator to show your best effort. Use the indicator as a goal to work toward during each repetition. ?After each set of 10 deep breaths, practice coughing to be sure your lungs are clear. If you have an incision (the cut made at the time of surgery), support your incision when coughing by placing a pillow or rolled up towels firmly against it. ?Once you are able to get out of bed, walk around indoors and cough well. You may stop using the incentive spirometer when instructed by your caregiver.  ?RISKS AND COMPLICATIONS ?Take your time so you do not get dizzy or light-headed. ?If you are in pain, you may need to take or ask for pain medication before doing incentive spirometry. It is harder to take a deep breath if you are  having pain. ?AFTER USE ?Rest and breathe slowly and easily. ?It can be helpful to keep track of a log of your progress. Your caregiver can provide you with a simple table to help with this. ?If you are using th

## 2021-11-21 NOTE — Progress Notes (Addendum)
COVID Vaccine Completed: yes x2 ?Date COVID Vaccine completed: 08/28/19, 09/26/19 ?Has received booster: ?COVID vaccine manufacturer: [employer]  ? ?Date of COVID positive in last 90 days: no ? ?PCP - Nita Sells, MD ?Cardiologist - n/a ? ?Chest x-ray - n/a ?EKG - 11/26/21 Epic/chart ?Stress Test - a long tome ago per pt ?ECHO - 06/23/19 Epic ?Cardiac Cath - n/a ?Pacemaker/ICD device last checked: n/a ?Spinal Cord Stimulator: n/a ? ?Bowel Prep - n/a ? ?Sleep Study - n/a ?CPAP -  ? ?Fasting Blood Sugar - n/a ?Checks Blood Sugar _____ times a day ? ?Blood Thinner Instructions: ?Aspirin Instructions: ?Last Dose: ? ?Activity level: Can perform activities of daily living without stopping and without symptoms of chest pain or shortness of breath. Difficulty with stairs due to knee pain ?   ?Anesthesia review:  ? ?Patient denies shortness of breath, fever, cough and chest pain at PAT appointment ? ? ?Patient verbalized understanding of instructions that were given to them at the PAT appointment. Patient was also instructed that they will need to review over the PAT instructions again at home before surgery.  ?

## 2021-11-26 ENCOUNTER — Encounter (HOSPITAL_COMMUNITY): Payer: Self-pay

## 2021-11-26 ENCOUNTER — Encounter (HOSPITAL_COMMUNITY)
Admission: RE | Admit: 2021-11-26 | Discharge: 2021-11-26 | Disposition: A | Discharge status: Home / Self Care | Payer: Medicare PPO | Source: Ambulatory Visit | Attending: Orthopedic Surgery | Admitting: Orthopedic Surgery

## 2021-11-26 VITALS — BP 186/81 | HR 63 | Temp 98.3°F | Resp 14 | Ht 62.0 in | Wt 205.0 lb

## 2021-11-26 DIAGNOSIS — Z01818 Encounter for other preprocedural examination: Secondary | ICD-10-CM | POA: Diagnosis present

## 2021-11-26 DIAGNOSIS — I10 Essential (primary) hypertension: Secondary | ICD-10-CM | POA: Insufficient documentation

## 2021-11-26 HISTORY — DX: Gastro-esophageal reflux disease without esophagitis: K21.9

## 2021-11-26 HISTORY — DX: Osteomyelitis, unspecified: M86.9

## 2021-11-26 HISTORY — DX: Obsessive-compulsive disorder, unspecified: F42.9

## 2021-11-26 LAB — CBC
HCT: 37.6 % (ref 36.0–46.0)
Hemoglobin: 11.9 g/dL — ABNORMAL LOW (ref 12.0–15.0)
MCH: 29.9 pg (ref 26.0–34.0)
MCHC: 31.6 g/dL (ref 30.0–36.0)
MCV: 94.5 fL (ref 80.0–100.0)
Platelets: 173 10*3/uL (ref 150–400)
RBC: 3.98 MIL/uL (ref 3.87–5.11)
RDW: 13.2 % (ref 11.5–15.5)
WBC: 5.4 10*3/uL (ref 4.0–10.5)
nRBC: 0 % (ref 0.0–0.2)

## 2021-11-26 LAB — BASIC METABOLIC PANEL
Anion gap: 9 (ref 5–15)
BUN: 17 mg/dL (ref 8–23)
CO2: 24 mmol/L (ref 22–32)
Calcium: 9.3 mg/dL (ref 8.9–10.3)
Chloride: 106 mmol/L (ref 98–111)
Creatinine, Ser: 0.65 mg/dL (ref 0.44–1.00)
GFR, Estimated: >60 mL/min (ref >60)
Glucose, Bld: 105 mg/dL — ABNORMAL HIGH (ref 70–99)
Potassium: 3.9 mmol/L (ref 3.5–5.1)
Sodium: 139 mmol/L (ref 135–145)

## 2021-11-26 LAB — SURGICAL PCR SCREEN
MRSA, PCR: NEGATIVE
Staphylococcus aureus: NEGATIVE

## 2021-12-08 NOTE — Progress Notes (Signed)
Patient called stating surgery time has been moved. Wanted to confirm what time to be in hospital and when to drink Pre surgical Ensure. Informed patient to arrive at 1000 and that she can have clear liquids from 0000-0930. Instructed Ensure drink has to be finished by 0930. Patient verbalized understanding.

## 2021-12-08 NOTE — Progress Notes (Signed)
Pt aware to arrive at [facility] for scheduled surgical procedure on Tuesday 12/09/2021. No food after midnight; clear liquids from midnight till 0430 consuming entire pre surgery drink by 0430 then nothing by mouth.

## 2021-12-09 ENCOUNTER — Ambulatory Visit (HOSPITAL_COMMUNITY): Payer: Medicare PPO

## 2021-12-09 ENCOUNTER — Encounter (HOSPITAL_COMMUNITY): Payer: Self-pay | Admitting: Orthopedic Surgery

## 2021-12-09 ENCOUNTER — Other Ambulatory Visit: Payer: Self-pay

## 2021-12-09 ENCOUNTER — Ambulatory Visit (HOSPITAL_BASED_OUTPATIENT_CLINIC_OR_DEPARTMENT_OTHER): Payer: Medicare PPO | Admitting: Certified Registered"

## 2021-12-09 ENCOUNTER — Ambulatory Visit (HOSPITAL_COMMUNITY): Payer: Medicare PPO | Admitting: Certified Registered"

## 2021-12-09 ENCOUNTER — Encounter (HOSPITAL_COMMUNITY)
Admission: RE | Disposition: A | Discharge status: Home / Self Care | Payer: Self-pay | Source: Ambulatory Visit | Attending: Orthopedic Surgery

## 2021-12-09 ENCOUNTER — Ambulatory Visit (HOSPITAL_COMMUNITY)
Admission: RE | Admit: 2021-12-09 | Discharge: 2021-12-09 | Disposition: A | Discharge status: Home / Self Care | Payer: Medicare PPO | Source: Ambulatory Visit | Attending: Orthopedic Surgery | Admitting: Orthopedic Surgery

## 2021-12-09 DIAGNOSIS — M1711 Unilateral primary osteoarthritis, right knee: Secondary | ICD-10-CM | POA: Insufficient documentation

## 2021-12-09 DIAGNOSIS — Z96651 Presence of right artificial knee joint: Secondary | ICD-10-CM

## 2021-12-09 DIAGNOSIS — I10 Essential (primary) hypertension: Secondary | ICD-10-CM | POA: Insufficient documentation

## 2021-12-09 DIAGNOSIS — Z87891 Personal history of nicotine dependence: Secondary | ICD-10-CM | POA: Diagnosis not present

## 2021-12-09 HISTORY — PX: TOTAL KNEE ARTHROPLASTY: SHX125

## 2021-12-09 SURGERY — ARTHROPLASTY, KNEE, TOTAL
Anesthesia: Spinal | Site: Knee | Laterality: Right

## 2021-12-09 MED ORDER — LACTATED RINGERS IV SOLN: INTRAVENOUS | Status: DC

## 2021-12-09 MED ORDER — DEXAMETHASONE SODIUM PHOSPHATE 10 MG/ML IJ SOLN
INTRAMUSCULAR | Status: AC
  Filled 2021-12-09: qty 1

## 2021-12-09 MED ORDER — CEFAZOLIN SODIUM-DEXTROSE 2-4 GM/100ML-% IV SOLN: 2.0000 g | Freq: Four times a day (QID) | INTRAVENOUS | Status: DC

## 2021-12-09 MED ORDER — DEXAMETHASONE SODIUM PHOSPHATE 10 MG/ML IJ SOLN
INTRAMUSCULAR | Status: DC | PRN
  Administered 2021-12-09: 10 mg via INTRAVENOUS

## 2021-12-09 MED ORDER — ACETAMINOPHEN 500 MG PO TABS: 1000.0000 mg | ORAL_TABLET | Freq: Four times a day (QID) | ORAL | 0 refills | Status: DC | PRN

## 2021-12-09 MED ORDER — PROPOFOL 10 MG/ML IV BOLUS
INTRAVENOUS | Status: DC | PRN
  Administered 2021-12-09 (×2): 30 mg via INTRAVENOUS

## 2021-12-09 MED ORDER — BUPIVACAINE LIPOSOME 1.3 % IJ SUSP: 20.0000 mL | Freq: Once | INTRAMUSCULAR | Status: DC

## 2021-12-09 MED ORDER — OXYCODONE HCL 5 MG PO TABS
ORAL_TABLET | ORAL | Status: AC
  Filled 2021-12-09: qty 1

## 2021-12-09 MED ORDER — ROPIVACAINE HCL 5 MG/ML IJ SOLN
INTRAMUSCULAR | Status: DC | PRN
  Administered 2021-12-09 (×7): 5 mL via PERINEURAL

## 2021-12-09 MED ORDER — CELECOXIB 200 MG PO CAPS: 200.0000 mg | ORAL_CAPSULE | Freq: Two times a day (BID) | ORAL | 0 refills | Status: AC

## 2021-12-09 MED ORDER — TRANEXAMIC ACID-NACL 1000-0.7 MG/100ML-% IV SOLN: 1000.0000 mg | Freq: Once | INTRAVENOUS | Status: DC

## 2021-12-09 MED ORDER — ACETAMINOPHEN 500 MG PO TABS
1000.0000 mg | ORAL_TABLET | Freq: Once | ORAL | Status: DC
  Filled 2021-12-09: qty 2

## 2021-12-09 MED ORDER — SODIUM CHLORIDE (PF) 0.9 % IJ SOLN
INTRAMUSCULAR | Status: AC
  Filled 2021-12-09: qty 30

## 2021-12-09 MED ORDER — MIDAZOLAM HCL 2 MG/2ML IJ SOLN
INTRAMUSCULAR | Status: AC
  Filled 2021-12-09: qty 2

## 2021-12-09 MED ORDER — HYDROMORPHONE HCL 1 MG/ML IJ SOLN
0.2500 mg | INTRAMUSCULAR | Status: DC | PRN
  Administered 2021-12-09 (×4): 0.25 mg via INTRAVENOUS

## 2021-12-09 MED ORDER — FENTANYL CITRATE (PF) 100 MCG/2ML IJ SOLN
INTRAMUSCULAR | Status: AC
  Filled 2021-12-09: qty 2

## 2021-12-09 MED ORDER — PROPOFOL 500 MG/50ML IV EMUL
INTRAVENOUS | Status: DC | PRN
  Administered 2021-12-09: 100 ug/kg/min via INTRAVENOUS

## 2021-12-09 MED ORDER — MIDAZOLAM HCL 5 MG/5ML IJ SOLN
INTRAMUSCULAR | Status: DC | PRN
  Administered 2021-12-09 (×2): 1 mg via INTRAVENOUS

## 2021-12-09 MED ORDER — MEPERIDINE HCL 50 MG/ML IJ SOLN: 6.2500 mg | INTRAMUSCULAR | Status: DC | PRN

## 2021-12-09 MED ORDER — FENTANYL CITRATE (PF) 100 MCG/2ML IJ SOLN
INTRAMUSCULAR | Status: DC | PRN
  Administered 2021-12-09 (×2): 50 ug via INTRAVENOUS

## 2021-12-09 MED ORDER — ORAL CARE MOUTH RINSE: 15.0000 mL | Freq: Once | OROMUCOSAL | Status: AC

## 2021-12-09 MED ORDER — DEXAMETHASONE SODIUM PHOSPHATE 10 MG/ML IJ SOLN
INTRAMUSCULAR | Status: DC | PRN
  Administered 2021-12-09: 10 mg

## 2021-12-09 MED ORDER — LACTATED RINGERS IV BOLUS
250.0000 mL | Freq: Once | INTRAVENOUS | Status: AC
  Administered 2021-12-09: 250 mL via INTRAVENOUS

## 2021-12-09 MED ORDER — ONDANSETRON HCL 4 MG/2ML IJ SOLN
INTRAMUSCULAR | Status: AC
  Filled 2021-12-09: qty 2

## 2021-12-09 MED ORDER — METHOCARBAMOL 500 MG PO TABS: 500.0000 mg | ORAL_TABLET | Freq: Four times a day (QID) | ORAL | Status: DC | PRN

## 2021-12-09 MED ORDER — LACTATED RINGERS IV BOLUS
500.0000 mL | Freq: Once | INTRAVENOUS | Status: AC
  Administered 2021-12-09: 500 mL via INTRAVENOUS

## 2021-12-09 MED ORDER — METHOCARBAMOL 750 MG PO TABS: 750.0000 mg | ORAL_TABLET | Freq: Three times a day (TID) | ORAL | 0 refills | Status: DC | PRN

## 2021-12-09 MED ORDER — TRANEXAMIC ACID-NACL 1000-0.7 MG/100ML-% IV SOLN
INTRAVENOUS | Status: AC
  Filled 2021-12-09: qty 100

## 2021-12-09 MED ORDER — DEXAMETHASONE SODIUM PHOSPHATE 10 MG/ML IJ SOLN: 8.0000 mg | Freq: Once | INTRAMUSCULAR | Status: DC

## 2021-12-09 MED ORDER — TRANEXAMIC ACID-NACL 1000-0.7 MG/100ML-% IV SOLN
1000.0000 mg | Freq: Once | INTRAVENOUS | Status: AC
  Administered 2021-12-09: 1000 mg via INTRAVENOUS

## 2021-12-09 MED ORDER — BUPIVACAINE LIPOSOME 1.3 % IJ SUSP
INTRAMUSCULAR | Status: AC
  Filled 2021-12-09: qty 20

## 2021-12-09 MED ORDER — ONDANSETRON 4 MG PO TBDP: 4.0000 mg | ORAL_TABLET | Freq: Two times a day (BID) | ORAL | 0 refills | Status: DC | PRN

## 2021-12-09 MED ORDER — SODIUM CHLORIDE 0.9 % IR SOLN
Status: DC | PRN
  Administered 2021-12-09: 1000 mL

## 2021-12-09 MED ORDER — HYDROMORPHONE HCL 1 MG/ML IJ SOLN
INTRAMUSCULAR | Status: AC
  Filled 2021-12-09: qty 1

## 2021-12-09 MED ORDER — PROPOFOL 1000 MG/100ML IV EMUL
INTRAVENOUS | Status: AC
  Filled 2021-12-09: qty 100

## 2021-12-09 MED ORDER — POVIDONE-IODINE 10 % EX SWAB: 2.0000 "application " | Freq: Once | CUTANEOUS | Status: DC

## 2021-12-09 MED ORDER — BUPIVACAINE LIPOSOME 1.3 % IJ SUSP
INTRAMUSCULAR | Status: DC | PRN
  Administered 2021-12-09: 20 mL

## 2021-12-09 MED ORDER — POVIDONE-IODINE 10 % EX SWAB
2.0000 "application " | Freq: Once | CUTANEOUS | Status: AC
  Administered 2021-12-09: 2 via TOPICAL

## 2021-12-09 MED ORDER — ONDANSETRON HCL 4 MG/2ML IJ SOLN
INTRAMUSCULAR | Status: DC | PRN
  Administered 2021-12-09: 4 mg via INTRAVENOUS

## 2021-12-09 MED ORDER — OXYCODONE HCL 5 MG PO TABS: 5.0000 mg | ORAL_TABLET | Freq: Three times a day (TID) | ORAL | 0 refills | Status: DC | PRN

## 2021-12-09 MED ORDER — OXYCODONE HCL 5 MG PO TABS
10.0000 mg | ORAL_TABLET | Freq: Every day | ORAL | Status: DC
  Administered 2021-12-09: 10 mg via ORAL

## 2021-12-09 MED ORDER — CHLORHEXIDINE GLUCONATE 0.12 % MT SOLN
15.0000 mL | Freq: Once | OROMUCOSAL | Status: AC
  Administered 2021-12-09: 15 mL via OROMUCOSAL

## 2021-12-09 MED ORDER — METHOCARBAMOL 500 MG IVPB - SIMPLE MED: 500.0000 mg | Freq: Four times a day (QID) | INTRAVENOUS | Status: DC | PRN

## 2021-12-09 MED ORDER — BUPIVACAINE-EPINEPHRINE 0.25% -1:200000 IJ SOLN
INTRAMUSCULAR | Status: DC | PRN
  Administered 2021-12-09: 30 mL

## 2021-12-09 MED ORDER — CLONIDINE HCL (ANALGESIA) 100 MCG/ML EP SOLN
EPIDURAL | Status: DC | PRN
  Administered 2021-12-09: 100 ug

## 2021-12-09 MED ORDER — BUPIVACAINE-EPINEPHRINE (PF) 0.25% -1:200000 IJ SOLN
INTRAMUSCULAR | Status: AC
  Filled 2021-12-09: qty 30

## 2021-12-09 MED ORDER — ASPIRIN 81 MG PO TBEC: 81.0000 mg | DELAYED_RELEASE_TABLET | Freq: Two times a day (BID) | ORAL | 0 refills | Status: DC

## 2021-12-09 MED ORDER — TRANEXAMIC ACID-NACL 1000-0.7 MG/100ML-% IV SOLN
1000.0000 mg | INTRAVENOUS | Status: AC
  Administered 2021-12-09: 1000 mg via INTRAVENOUS
  Filled 2021-12-09: qty 100

## 2021-12-09 MED ORDER — CEFAZOLIN SODIUM-DEXTROSE 2-4 GM/100ML-% IV SOLN
2.0000 g | INTRAVENOUS | Status: AC
  Administered 2021-12-09: 2 g via INTRAVENOUS
  Filled 2021-12-09: qty 100

## 2021-12-09 MED ORDER — 0.9 % SODIUM CHLORIDE (POUR BTL) OPTIME
TOPICAL | Status: DC | PRN
  Administered 2021-12-09: 1000 mL

## 2021-12-09 MED ORDER — ONDANSETRON HCL 4 MG/2ML IJ SOLN: 4.0000 mg | Freq: Once | INTRAMUSCULAR | Status: DC | PRN

## 2021-12-09 MED ORDER — BUPIVACAINE IN DEXTROSE 0.75-8.25 % IT SOLN
INTRATHECAL | Status: DC | PRN
  Administered 2021-12-09: 1.6 mL via INTRATHECAL

## 2021-12-09 MED ORDER — WATER FOR IRRIGATION, STERILE IR SOLN
Status: DC | PRN
Start: 2021-12-09 — End: 2021-12-09
  Administered 2021-12-09: 2000 mL

## 2021-12-09 MED ORDER — SODIUM CHLORIDE 0.9% FLUSH
INTRAVENOUS | Status: DC | PRN
  Administered 2021-12-09: 30 mL

## 2021-12-09 SURGICAL SUPPLY — 51 items
BAG COUNTER SPONGE SURGICOUNT (BAG) IMPLANT
BLADE HEX COATED 2.75 (ELECTRODE) ×2 IMPLANT
BLADE SAG 18X100X1.27 (BLADE) ×2 IMPLANT
BLADE SAGITTAL 25.0X1.37X90 (BLADE) ×2 IMPLANT
BLADE SURG 15 STRL LF DISP TIS (BLADE) ×1 IMPLANT
BLADE SURG 15 STRL SS (BLADE) ×2
BLADE SURG SZ10 CARB STEEL (BLADE) IMPLANT
BNDG ELASTIC 6X10 VLCR STRL LF (GAUZE/BANDAGES/DRESSINGS) ×2 IMPLANT
BOWL SMART MIX CTS (DISPOSABLE) IMPLANT
CLSR STERI-STRIP ANTIMIC 1/2X4 (GAUZE/BANDAGES/DRESSINGS) ×2 IMPLANT
COVER SURGICAL LIGHT HANDLE (MISCELLANEOUS) ×2 IMPLANT
CUFF TOURN SGL QUICK 34 (TOURNIQUET CUFF) ×2
CUFF TRNQT CYL 34X4.125X (TOURNIQUET CUFF) ×1 IMPLANT
DRAPE U-SHAPE 47X51 STRL (DRAPES) ×2 IMPLANT
DRSG MEPILEX BORDER 4X12 (GAUZE/BANDAGES/DRESSINGS) ×2 IMPLANT
DURAPREP 26ML APPLICATOR (WOUND CARE) ×4 IMPLANT
GLOVE BIO SURGEON STRL SZ7.5 (GLOVE) ×4 IMPLANT
GLOVE BIOGEL PI IND STRL 7.5 (GLOVE) ×1 IMPLANT
GLOVE BIOGEL PI IND STRL 8 (GLOVE) ×1 IMPLANT
GLOVE BIOGEL PI INDICATOR 7.5 (GLOVE) ×1
GLOVE BIOGEL PI INDICATOR 8 (GLOVE) ×1
GLOVE SURG SYN 7.5  E (GLOVE) ×2
GLOVE SURG SYN 7.5 E (GLOVE) ×1 IMPLANT
GLOVE SURG SYN 7.5 PF PI (GLOVE) ×1 IMPLANT
GOWN SPEC L4 XLG W/TWL (GOWN DISPOSABLE) ×2 IMPLANT
GOWN STRL REUS W/ TWL LRG LVL3 (GOWN DISPOSABLE) ×1 IMPLANT
GOWN STRL REUS W/TWL LRG LVL3 (GOWN DISPOSABLE) ×2
HANDPIECE INTERPULSE COAX TIP (DISPOSABLE) ×2
HOLDER FOLEY CATH W/STRAP (MISCELLANEOUS) IMPLANT
IMMOBILIZER KNEE 22 UNIV (SOFTGOODS) ×2 IMPLANT
INSERT TIB BEARING X3 9 SZ5 (Insert) ×1 IMPLANT
KNEE FEMORAL COMP RT RETAIN (Knees) ×1 IMPLANT
KNEE PATELLA ASYMMETRIC 9X29 (Knees) ×1 IMPLANT
KNEE TIBIAL COMPONENT SZ5 (Knees) ×1 IMPLANT
MANIFOLD NEPTUNE II (INSTRUMENTS) ×2 IMPLANT
NS IRRIG 1000ML POUR BTL (IV SOLUTION) ×2 IMPLANT
PACK TOTAL KNEE CUSTOM (KITS) ×2 IMPLANT
PIN FLUTED HEDLESS FIX 3.5X1/8 (PIN) ×1 IMPLANT
PROTECTOR NERVE ULNAR (MISCELLANEOUS) ×2 IMPLANT
SET HNDPC FAN SPRY TIP SCT (DISPOSABLE) ×1 IMPLANT
SPIKE FLUID TRANSFER (MISCELLANEOUS) ×2 IMPLANT
SUT MNCRL AB 3-0 PS2 18 (SUTURE) ×2 IMPLANT
SUT VIC AB 0 CT1 36 (SUTURE) ×2 IMPLANT
SUT VIC AB 1 CT1 36 (SUTURE) ×4 IMPLANT
SUT VIC AB 2-0 CT1 27 (SUTURE) ×2
SUT VIC AB 2-0 CT1 TAPERPNT 27 (SUTURE) ×1 IMPLANT
TAPE STRIPS DRAPE STRL (GAUZE/BANDAGES/DRESSINGS) ×1 IMPLANT
TRAY FOLEY MTR SLVR 14FR STAT (SET/KITS/TRAYS/PACK) IMPLANT
TRAY FOLEY MTR SLVR 16FR STAT (SET/KITS/TRAYS/PACK) IMPLANT
TUBE SUCTION HIGH CAP CLEAR NV (SUCTIONS) ×2 IMPLANT
WRAP KNEE MAXI GEL POST OP (GAUZE/BANDAGES/DRESSINGS) ×2 IMPLANT

## 2021-12-09 NOTE — Anesthesia Procedure Notes (Signed)
Spinal  Patient location during procedure: OR Start time: 12/09/2021 7:40 AM End time: 12/09/2021 7:44 AM Reason for block: surgical anesthesia Staffing Performed: anesthesiologist  Anesthesiologist: Leilani Able, MD Preanesthetic Checklist Completed: patient identified, IV checked, site marked, risks and benefits discussed, surgical consent, monitors and equipment checked, pre-op evaluation and timeout performed Spinal Block Patient position: sitting Prep: DuraPrep and site prepped and draped Patient monitoring: continuous pulse ox and blood pressure Approach: midline Location: L3-4 Injection technique: single-shot Needle Needle type: Pencan  Needle gauge: 24 G Needle length: 10 cm Needle insertion depth: 6 cm Assessment Sensory level: T4 Events: CSF return

## 2021-12-09 NOTE — Discharge Instructions (Signed)

## 2021-12-09 NOTE — Evaluation (Signed)
Physical Therapy Evaluation Patient Details Name: Shawna Lewis MRN: [id number] DOB: [birth year] Today's Date: 12/09/2021  History of Present Illness  Patient is 71 y.o. female s/p Rt TKA on 12/09/21 with PMH significant for anemia, OA, fibromyalgia, GERD, HTN, OCD, osteomyelitis.   Clinical Impression  Shawna Lewis is a 71 y.o. female POD 0 s/p Rt TKA. Patient reports independence with SPC/rollator for mobility at baseline. Patient is now limited by functional impairments (see PT problem list below) and requires min guard for transfers and gait with RW. Patient was able to ambulate ~100 feet with RW and min guard and cues for safe walker management. Patient educated on safe sequencing for stair mobility and verbalized safe guarding position for people assisting with mobility. Patient instructed in exercises to facilitate ROM and circulation. Patient will benefit from continued skilled PT interventions to address impairments and progress towards PLOF. Patient has met mobility goals at adequate level for discharge home; will continue to follow if pt continues acute stay to progress towards Mod I goals.        Recommendations for follow up therapy are one component of a multi-disciplinary discharge planning process, led by the attending physician.  Recommendations may be updated based on patient status, additional functional criteria and insurance authorization.  Follow Up Recommendations Follow physician's recommendations for discharge plan and follow up therapies    Assistance Recommended at Discharge Intermittent Supervision/Assistance  Patient can return home with the following  A little help with walking and/or transfers;A little help with bathing/dressing/bathroom;Assistance with cooking/housework;Assist for transportation;Help with stairs or ramp for entrance    Equipment Recommendations None recommended by PT  Recommendations for Other Services       Functional Status Assessment  Patient has had a recent decline in their functional status and demonstrates the ability to make significant improvements in function in a reasonable and predictable amount of time.     Precautions / Restrictions Precautions Precautions: Fall Restrictions Weight Bearing Restrictions: No Other Position/Activity Restrictions: WBAT      Mobility  Bed Mobility Overal bed mobility: Needs Assistance Bed Mobility: Supine to Sit     Supine to sit: Min guard, HOB elevated     General bed mobility comments: guaridng for safety with pt using bil UE to scoot to EOB.    Transfers Overall transfer level: Needs assistance Equipment used: Rolling walker (2 wheels) Transfers: Sit to/from Stand Sit to Stand: Min guard           General transfer comment: guarding for safety, cues to use bil UE on EOB to initaite power up. pt required extra time and effort to complete rise to RW.VC's to extend Rt knee when sitting on toilet and recliner.    Ambulation/Gait Ambulation/Gait assistance: Min guard Gait Distance (Feet): 100 Feet Assistive device: Rolling walker (2 wheels) Gait Pattern/deviations: Step-to pattern, Decreased stride length, Decreased stance time - right, Decreased weight shift to right, Trunk flexed Gait velocity: decr     General Gait Details: VC's for safe management of RW, intermittent assist to position walker and pt improved to min guard throughout. no overt LOB or buckling at Rt knee.  Stairs Stairs: Yes Stairs assistance: Min guard Stair Management: One rail Right, Step to pattern, Sideways Number of Stairs: 4 (2x2) General stair comments: VC's for sequencing "up with good, down with bad" pt leaning heavily on rail for support. Rt knee stable with no buckling during stance.  Wheelchair Mobility    Modified Rankin (Stroke Patients Only)  Balance Overall balance assessment: Needs assistance Sitting-balance support: Feet supported Sitting balance-Leahy  Scale: Good     Standing balance support: Reliant on assistive device for balance, During functional activity, Bilateral upper extremity supported Standing balance-Leahy Scale: Poor                               Pertinent Vitals/Pain Pain Assessment Pain Assessment: 0-10 Pain Score: 4  Pain Location: Rt knee Pain Descriptors / Indicators: Aching, Discomfort Pain Intervention(s): Limited activity within patient's tolerance, Monitored during session, Repositioned    Home Living Family/patient expects to be discharged to:: Private residence Living Arrangements: Spouse/significant other Available Help at Discharge: Family Type of Home: House Home Access: Stairs to enter Entrance Stairs-Rails: Psychiatric nurse of Steps: 5-6   Home Layout: One level Home Equipment: Conservation officer, nature (2 wheels);Rollator (4 wheels);Cane - single point;Shower seat      Prior Function Prior Level of Function : Independent/Modified Independent             Mobility Comments: using SPC for gait or rollator (often using a tray table) ADLs Comments: pt taking sink baths     Hand Dominance   Dominant Hand: Right    Extremity/Trunk Assessment   Upper Extremity Assessment Upper Extremity Assessment: Overall WFL for tasks assessed    Lower Extremity Assessment Lower Extremity Assessment: RLE deficits/detail RLE Deficits / Details: good quad activaiton, no extensor lag with SLR RLE Sensation: WNL RLE Coordination: WNL    Cervical / Trunk Assessment Cervical / Trunk Assessment: Normal  Communication   Communication: No difficulties  Cognition Arousal/Alertness: Awake/alert Behavior During Therapy: WFL for tasks assessed/performed Overall Cognitive Status: Within Functional Limits for tasks assessed                                          General Comments      Exercises Total Joint Exercises Ankle Circles/Pumps: AROM, Both, 10 reps Quad  Sets: AROM, Right, 5 reps Short Arc Quad: AROM, Right, Other reps (comment) (3) Heel Slides: AROM, Right, 5 reps Hip ABduction/ADduction: AROM, Right, Other reps (comment) (3) Straight Leg Raises: AROM, Right, Other reps (comment) (2)   Assessment/Plan    PT Assessment Patient needs continued PT services  PT Problem List Decreased strength;Decreased range of motion;Decreased activity tolerance;Decreased balance;Decreased mobility;Decreased knowledge of use of DME;Decreased safety awareness;Decreased knowledge of precautions;Pain       PT Treatment Interventions DME instruction;Gait training;Stair training;Functional mobility training;Therapeutic activities;Therapeutic exercise;Balance training;Neuromuscular re-education;Patient/family education    PT Goals (Current goals can be found in the Care Plan section)  Acute Rehab PT Goals Patient Stated Goal: see her grandkids, get home today PT Goal Formulation: With patient Time For Goal Achievement: 12/16/21 Potential to Achieve Goals: Good    Frequency 7X/week     Co-evaluation               AM-PAC PT "6 Clicks" Mobility  Outcome Measure Help needed turning from your back to your side while in a flat bed without using bedrails?: A Little Help needed moving from lying on your back to sitting on the side of a flat bed without using bedrails?: A Little Help needed moving to and from a bed to a chair (including a wheelchair)?: A Little Help needed standing up from a chair using your arms (e.g., wheelchair or bedside  chair)?: A Little Help needed to walk in hospital room?: A Little Help needed climbing 3-5 steps with a railing? : A Little 6 Click Score: 18    End of Session Equipment Utilized During Treatment: Gait belt Activity Tolerance: Patient tolerated treatment well Patient left: in chair;with call bell/phone within reach Nurse Communication: Mobility status PT Visit Diagnosis: Muscle weakness (generalized)  (M62.81);Difficulty in walking, not elsewhere classified (R26.2);Pain Pain - Right/Left: Right Pain - part of body: Knee    Time: 1202-1250 PT Time Calculation (min) (ACUTE ONLY): 48 min   Charges:   PT Evaluation $PT Eval Low Complexity: 1 Low PT Treatments $Gait Training: 8-22 mins $Therapeutic Exercise: 8-22 mins        Verner Mould, DPT Acute Rehabilitation Services Office [phone] Pager [phone]  12/09/21 2:16 PM

## 2021-12-09 NOTE — Anesthesia Procedure Notes (Signed)
Anesthesia Regional Block: Adductor canal block   Pre-Anesthetic Checklist: , timeout performed,  Correct Patient, Correct Site, Correct Laterality,  Correct Procedure, Correct Position, site marked,  Risks and benefits discussed,  Surgical consent,  Pre-op evaluation,  At surgeon's request and post-op pain management  Laterality: Lower and Right  Prep: chloraprep       Needles:  Injection technique: Single-shot  Needle Type: Echogenic Stimulator Needle     Needle Length: 9cm  Needle Gauge: 20   Needle insertion depth: 3.5 cm   Additional Needles:   Procedures:,,,, ultrasound used (permanent image in chart),,    Narrative:  Start time: 12/09/2021 7:20 AM End time: 12/09/2021 7:27 AM Injection made incrementally with aspirations every 5 mL.  Performed by: Personally  Anesthesiologist: Leilani Able, MD

## 2021-12-09 NOTE — Anesthesia Postprocedure Evaluation (Signed)
Anesthesia Post Note  Patient: Shawna Lewis  Procedure(s) Performed: TOTAL KNEE ARTHROPLASTY (Right: Knee)     Patient location during evaluation: PACU Anesthesia Type: Spinal Level of consciousness: awake and sedated Pain management: pain level controlled Vital Signs Assessment: post-procedure vital signs reviewed and stable Respiratory status: spontaneous breathing Cardiovascular status: stable Postop Assessment: no apparent nausea or vomiting, no headache, spinal receding and patient able to bend at knees Anesthetic complications: no   No notable events documented.  Last Vitals:  Vitals:   12/09/21 1030 12/09/21 1045  BP: (!) 156/76 (!) 143/65  Pulse: 79 73  Resp: 15 (!) 22  Temp:    SpO2: 92% 96%    Last Pain:  Vitals:   12/09/21 1045  TempSrc:   PainSc: 5                  John F [address]

## 2021-12-09 NOTE — Interval H&P Note (Signed)
History and Physical Interval Note:  12/09/2021 7:27 AM  Shawna Lewis  has presented today for surgery, with the diagnosis of OASTEOARTHRITIS RIGHT KNEE.  The various methods of treatment have been discussed with the patient and family. After consideration of risks, benefits and other options for treatment, the patient has consented to  Procedure(s): TOTAL KNEE ARTHROPLASTY (Right) as a surgical intervention.  The patient's history has been reviewed, patient examined, no change in status, stable for surgery.  I have reviewed the patient's chart and labs.  Questions were answered to the patient's satisfaction.     Sheral Apley

## 2021-12-09 NOTE — Transfer of Care (Signed)
Immediate Anesthesia Transfer of Care Note  Patient: Shawna Lewis  Procedure(s) Performed: TOTAL KNEE ARTHROPLASTY (Right: Knee)  Patient Location: PACU  Anesthesia Type:Regional and Spinal  Level of Consciousness: awake, alert  and oriented  Airway & Oxygen Therapy: Patient Spontanous Breathing and Patient connected to face mask oxygen  Post-op Assessment: Report given to RN and Post -op Vital signs reviewed and stable  Post vital signs: Reviewed and stable  Last Vitals:  Vitals Value Taken Time  BP 142/78 12/09/21 1006  Temp    Pulse 95 12/09/21 1006  Resp 17 12/09/21 1006  SpO2 99 % 12/09/21 1006  Vitals shown include unvalidated device data.  Last Pain:  Vitals:   12/09/21 0622  TempSrc:   PainSc: 5       Patients Stated Pain Goal: 3 (12/09/21 0622)  Complications: No notable events documented.

## 2021-12-09 NOTE — Op Note (Signed)
DATE OF SURGERY:  12/09/2021 TIME: 10:08 AM  PATIENT NAME:  Shawna Lewis   AGE: 71 y.o.    PRE-OPERATIVE DIAGNOSIS:  OASTEOARTHRITIS RIGHT KNEE  POST-OPERATIVE DIAGNOSIS:  Same  PROCEDURE:  Procedure(s): TOTAL KNEE ARTHROPLASTY   SURGEON:  Sheral Apley, MD   ASSISTANT:  Levester Fresh, PA-C, he was present and scrubbed throughout the case, critical for completion in a timely fashion, and for retraction, instrumentation, and closure.    OPERATIVE IMPLANTS: [employer] Triathlon CR. Press fit knee  Femur size 4, Tibia size 5, Patella size 29 3-peg oval button, with a 9 mm polyethylene insert.   PREOPERATIVE INDICATIONS:  Shawna Lewis is a 71 y.o. year old female with end stage bone on bone degenerative arthritis of the knee who failed conservative treatment, including injections, antiinflammatories, activity modification, and assistive devices, and had significant impairment of their activities of daily living, and elected for Total Knee Arthroplasty.   The risks, benefits, and alternatives were discussed at length including but not limited to the risks of infection, bleeding, nerve injury, stiffness, blood clots, the need for revision surgery, cardiopulmonary complications, among others, and they were willing to proceed.   OPERATIVE DESCRIPTION:  The patient was brought to the operative room and placed in a supine position.  General anesthesia was administered.  IV antibiotics were given.  The lower extremity was prepped and draped in the usual sterile fashion.  Time out was performed.  The leg was elevated and exsanguinated and the tourniquet was inflated.  Anterior approach was performed.  The patella was everted and osteophytes were removed.  The anterior horn of the medial and lateral meniscus was removed.   The distal femur was opened with the drill and the intramedullary distal femoral cutting jig was utilized, set at 5 degrees resecting 10 mm off the distal femur.  Care  was taken to protect the collateral ligaments.  The distal femoral sizing jig was applied, taking care to avoid notching.  Then the 4-in-1 cutting jig was applied and the anterior and posterior femur was cut, along with the chamfer cuts.  All posterior osteophytes were removed.  The flexion gap was then measured and was symmetric with the extension gap.  Then the extramedullary tibial cutting jig was utilized making the appropriate cut using the anterior tibial crest as a reference building in appropriate posterior slope.  Care was taken during the cut to protect the medial and collateral ligaments.  The proximal tibia was removed along with the posterior horns of the menisci.  The PCL was sacrificed.    The extensor gap was measured and was approximately 58mm.    I completed the distal femoral preparation using the appropriate jig to prepare the box.  The patella was then measured, and cut with the saw.    The proximal tibia sized and prepared accordingly with the reamer and the punch, and then all components were trialed with the above sized poly insert.  The knee was found to have excellent balance and full motion.    The above named components were then impacted into place and Poly tibial piece and patella were inserted.  I was very happy with his stability and ROM  I performed a periarticular injection with marcaine and toradol  The knee was easily taken through a range of motion and the patella tracked well and the knee irrigated copiously and the parapatellar and subcutaneous tissue closed with vicryl, and monocryl with steri strips for the skin.  The  incision was dressed with sterile gauze and the tourniquet released and the patient was awakened and returned to the PACU in stable and satisfactory condition.  There were no complications.  Total tourniquet time was roughly 75 minutes.   POSTOPERATIVE PLAN: post op Abx, DVT px: SCD's, TED's, Early ambulation and chemical px

## 2021-12-09 NOTE — Therapy (Signed)
OUTPATIENT PHYSICAL THERAPY LOWER EXTREMITY EVALUATION   Patient Name: Shawna Lewis MRN: [id number] DOB:Oct 07, 1950, 71 y.o., female Today's Date: 12/11/2021   PT End of Session - 12/11/21 1400     Visit Number 1    Number of Visits 8    Date for PT Re-Evaluation 01/08/22    Authorization Type [employer] Medicare Choice; no auth required; no VL    Progress Note Due on Visit 10    PT Start Time 0156    PT Stop Time 0232    PT Time Calculation (min) 36 min             Past Medical History:  Diagnosis Date   Anemia    Arthritis    DDD (degenerative disc disease), lumbar    Fibromyalgia    GERD (gastroesophageal reflux disease)    Heart murmur    Hypertension    Migraine    OCD (obsessive compulsive disorder)    Osteomyelitis (HCC)    Past Surgical History:  Procedure Laterality Date   ANKLE FRACTURE SURGERY  2018   BALLOON DILATION  05/23/2021   Procedure: BALLOON DILATION;  Surgeon: Dolores Frameastaneda Mayorga, Daniel, MD;  [facility];  Service: Gastroenterology;;  ascending colon strictures   BIOPSY  05/23/2021   Procedure: BIOPSY;  Surgeon: Dolores Frameastaneda Mayorga, Daniel, MD;  [facility]: [facility];  Service: Gastroenterology;;   BREAST BIOPSY  1995   CATARACT EXTRACTION     CESAREAN SECTION  1986   COLONOSCOPY WITH PROPOFOL N/A 05/23/2021   Procedure: COLONOSCOPY WITH PROPOFOL;  Surgeon: Dolores Frameastaneda Mayorga, Daniel, MD;  [facility];  Service: Gastroenterology;  Laterality: N/A;  10:10   ESOPHAGOGASTRODUODENOSCOPY (EGD) WITH PROPOFOL N/A 05/23/2021   Procedure: ESOPHAGOGASTRODUODENOSCOPY (EGD) WITH PROPOFOL;  Surgeon: Dolores Frameastaneda Mayorga, Daniel, MD;  [facility];  Service: Gastroenterology;  Laterality: N/A;   IR LUMBAR DISC ASPIRATION W/IMG GUIDE  06/22/2019   KNEE ARTHROSCOPY W/ MENISCAL REPAIR     RADIOFREQUENCY ABLATION NERVES     TONSILLECTOMY  1974   TOTAL KNEE ARTHROPLASTY Right 12/09/2021   Procedure: TOTAL KNEE ARTHROPLASTY;   Surgeon: Sheral ApleyMurphy, Timothy D, MD;  Location: [facility];  Service: Orthopedics;  Laterality: Right;   Patient Active Problem List   Diagnosis Date Noted   S/P total knee arthroplasty, right 12/09/2021   Positive FIT (fecal immunochemical test) 05/14/2021   Anemia 05/14/2021   Therapeutic drug monitoring 06/30/2019   Chronic left lumbar radiculopathy 06/21/2019   Diskitis 06/20/2019   Osteomyelitis (HCC) 06/20/2019   HTN (hypertension) 06/20/2019   DDD (degenerative disc disease), lumbar    Migraine    Chronic low back pain    Central stenosis of spinal canal     PCP: Nita SellsJohn Hall, MD  REFERRING PROVIDER: Margarita Ranaimothy Murphy, MD  REFERRING DIAG: PT eval/tx for post op rt TKR (DOS 12/09/21) schedule for 12/11/21 or 12/12/21 per Margarita Ranaimothy Murphy, MD   OASTEOARTHRITIS RIGHT KNEE   THERAPY DIAG:  Primary osteoarthritis of right knee  Difficulty in walking, not elsewhere classified  Total knee replacement status, right  Rationale for Evaluation and Treatment Rehabilitation  ONSET DATE: 12/09/2021  SUBJECTIVE:   SUBJECTIVE STATEMENT: Patient has had knee trouble for years;  Worse over the last 3-4 years. Using CPM at home.   PERTINENT HISTORY: Left knee needs TKA; 15 years ago arthroscopic surgery Left knee. Back issues;  Larey SeatFell and fractured Right ankle 2019   PAIN:  Are you having pain? Yes: NPRS scale: 4/10 Pain location: Right  knee Pain description: stinging, sore, aching Aggravating factors: movement Relieving factors: ice, pain meds  PRECAUTIONS: Fall  WEIGHT BEARING RESTRICTIONS No WBAT  FALLS:  Has patient fallen in last 6 months? No  LIVING ENVIRONMENT: Lives with: lives with their spouse Lives in: House/apartment Stairs: Yes: External: 5 steps; on right going up, on left going up, and bilateral but cannot reach both Has following equipment at home: Single point cane, Walker - 2 wheeled, Crutches, Wheelchair (manual), shower chair, bed side commode, and Grab  bars  OCCUPATION: retired  PLOF: Independent  PATIENT GOALS get my knee working well.    OBJECTIVE:   DIAGNOSTIC FINDINGS: CLINICAL DATA:  Status post right knee arthroplasty   EXAM: PORTABLE RIGHT KNEE - 1-2 VIEW   COMPARISON:  None Available.   FINDINGS: Status post right knee total arthroplasty with expected overlying postoperative change. No evidence of perihardware fracture or component malpositioning.   IMPRESSION: Status post right knee total arthroplasty with expected overlying postoperative change. No evidence of perihardware fracture or component malpositioning.    PATIENT SURVEYS:  FOTO 40  COGNITION:  Overall cognitive status: Within functional limits for tasks assessed     SENSATION: Neuropathy in feet  POSTURE: flexed trunk   PALPATION: General soreness normal for this time s/p Right TKA  LOWER EXTREMITY ROM:  Active ROM Right eval Left eval  Hip flexion    Hip extension    Hip abduction    Hip adduction    Hip internal rotation    Hip external rotation    Knee flexion 58   Knee extension -10   Ankle dorsiflexion    Ankle plantarflexion    Ankle inversion    Ankle eversion     (Blank rows = not tested)  LOWER EXTREMITY MMT:  MMT Right eval Left eval  Hip flexion    Hip extension    Hip abduction    Hip adduction    Hip internal rotation    Hip external rotation    Knee flexion    Knee extension Fair quad set   Ankle dorsiflexion    Ankle plantarflexion    Ankle inversion    Ankle eversion     (Blank rows = not tested)    GAIT: Distance walked: 50 Assistive device utilized: Environmental consultant - 2 wheeled Level of assistance: SBA Comments: wearing leg immobilizer Right knee; antalgic gait; step to gait    TODAY'S TREATMENT: Physical therapy evaluation, HEP instruction   PATIENT EDUCATION:  Education details: HEP Person educated: Patient Education method: Programmer, multimedia, Facilities manager, and Handouts Education comprehension:  verbalized understanding, returned demonstration, and needs further education   HOME EXERCISE PROGRAM: Access Code: [email or web address] URL: [email or web address] Date: 12/11/2021 Prepared by: [facility]  Exercises - Supine Ankle Pumps  - 2-3 x daily - 7 x weekly - 2 sets - 10 reps - Long Sitting Quad Set  - 2-3 x daily - 7 x weekly - 2 sets - 10 reps - Supine Hip Abduction AROM  - 2-3 x daily - 7 x weekly - 3 sets - 10 reps - Supine Heel Slide  - 2-3 x daily - 7 x weekly - 3 sets - 10 reps - Seated Knee Flexion Extension AROM   - 2-3 x daily - 7 x weekly - 3 sets - 10 reps - Seated Heel Toe Raises  - 2-3 x daily - 7 x weekly - 3 sets - 10 reps  ASSESSMENT:  CLINICAL IMPRESSION: Patient is a 71  y.o.  female who was seen today for physical therapy evaluation and treatment for s/p Right TKA. She presents with pain and swelling of the right leg, decreased Right knee mobility; decreased Right lower extremity strength and antalgic gait all that limit her ability to perform tasks in her home and access her community.  Patient will benefit from skilled physical therapy services to address deficits and promote return to optimal function.    OBJECTIVE IMPAIRMENTS Abnormal gait, decreased activity tolerance, decreased balance, decreased endurance, decreased mobility, difficulty walking, decreased ROM, decreased strength, hypomobility, increased edema, increased fascial restrictions, impaired perceived functional ability, impaired flexibility, and pain.   ACTIVITY LIMITATIONS carrying, lifting, bending, sitting, standing, squatting, sleeping, stairs, transfers, bed mobility, bathing, toileting, and dressing  PARTICIPATION LIMITATIONS: meal prep, cleaning, laundry, medication management, personal finances, driving, shopping, community activity, yard work, school, and church  PERSONAL FACTORS Age, Fitness, and Time since onset of injury/illness/exacerbation are also affecting patient's functional  outcome.   REHAB POTENTIAL: Good  CLINICAL DECISION MAKING: Stable/uncomplicated  EVALUATION COMPLEXITY: Low   GOALS: Goals reviewed with patient? No  SHORT TERM GOALS: Target date: 12/25/2021    patient will be independent with initial HEP Baseline: Goal status: INITIAL  2.   Patient will improve Right knee ROM to -8 to 90 to improve functional mobility in home  Baseline:  Knee flexion 58  Knee extension -10   Goal status: INITIAL  LONG TERM GOALS: Target date: 01/08/2022    Patient will be independent with advanced HEP and self management strategies to improve quality of life and functional outcomes.  Baseline:  Goal status: INITIAL  2.   Patient with improved Right knee ROM to -5 to 120  to be able to safely navigate stairs   Baseline:  Knee flexion 58  Knee extension -10   Goal status: INITIAL  3.  Patient will increase Right knee MMTs to 5/5 to promote return to ambulation community distances with minimal deviation.   Baseline:  Goal status: INITIAL  4.   Patient will improve FOTO score to predicted value to demonstrate improved functional mobility.  Baseline: 40 Goal status: INITIAL PLAN: PT FREQUENCY: 2x/week  PT DURATION: 4 weeks  PLANNED INTERVENTIONS: Therapeutic exercises, Therapeutic activity, Neuromuscular re-education, Balance training, Gait training, Patient/Family education, Joint manipulation, Joint mobilization, Stair training, Vestibular training, Canalith repositioning, Orthotic/Fit training, DME instructions, Aquatic Therapy, Dry Needling, Electrical stimulation, Spinal manipulation, Spinal mobilization, Cryotherapy, Moist heat, scar mobilization, Taping, Traction, Ultrasound, Biofeedback, Manual therapy, and Re-evaluation  PLAN FOR NEXT SESSION: Review HEP and goals; progress as able Sees MD in 2 weeks   5:21 PM, 12/11/21 Shawna Lewis Shawna Lewis MPT [facility] [place] [phone] Ph:[phone]

## 2021-12-09 NOTE — Anesthesia Preprocedure Evaluation (Signed)
Anesthesia Evaluation  Patient identified by MRN, date of birth, ID band Patient awake    Reviewed: Allergy & Precautions, NPO status , Patient's Chart, lab work & pertinent test results  Airway Mallampati: I       Dental no notable dental hx.    Pulmonary former smoker,    Pulmonary exam normal        Cardiovascular hypertension, Pt. on medications negative cardio ROS Normal cardiovascular exam Rate:Normal     Neuro/Psych  Headaches, Anxiety  Neuromuscular disease negative psych ROS   GI/Hepatic Neg liver ROS, GERD  Medicated and Controlled,  Endo/Other  negative endocrine ROS  Renal/GU negative Renal ROS  negative genitourinary   Musculoskeletal  (+) Arthritis , Osteoarthritis,    Abdominal Normal abdominal exam  (+)   Peds negative pediatric ROS (+)  Hematology negative hematology ROS (+)   Anesthesia Other Findings   Reproductive/Obstetrics negative OB ROS                             Anesthesia Physical Anesthesia Plan  ASA: 2  Anesthesia Plan: Spinal   Post-op Pain Management: Regional block*   Induction:   PONV Risk Score and Plan: 2 and Ondansetron and Dexamethasone  Airway Management Planned: Natural Airway and Simple Face Mask  Additional Equipment:   Intra-op Plan:   Post-operative Plan:   Informed Consent: I have reviewed the patients History and Physical, chart, labs and discussed the procedure including the risks, benefits and alternatives for the proposed anesthesia with the patient or authorized representative who has indicated his/her understanding and acceptance.       Plan Discussed with: CRNA  Anesthesia Plan Comments:         Anesthesia Quick Evaluation

## 2021-12-10 ENCOUNTER — Encounter (HOSPITAL_COMMUNITY): Payer: Self-pay | Admitting: Orthopedic Surgery

## 2021-12-11 ENCOUNTER — Ambulatory Visit (HOSPITAL_COMMUNITY): Payer: Medicare PPO | Attending: Orthopedic Surgery

## 2021-12-11 DIAGNOSIS — Z96651 Presence of right artificial knee joint: Secondary | ICD-10-CM | POA: Diagnosis present

## 2021-12-11 DIAGNOSIS — R262 Difficulty in walking, not elsewhere classified: Secondary | ICD-10-CM | POA: Diagnosis present

## 2021-12-11 DIAGNOSIS — M1711 Unilateral primary osteoarthritis, right knee: Secondary | ICD-10-CM | POA: Diagnosis present

## 2021-12-16 ENCOUNTER — Encounter (HOSPITAL_COMMUNITY): Payer: Self-pay | Admitting: Physical Therapy

## 2021-12-16 ENCOUNTER — Ambulatory Visit (HOSPITAL_COMMUNITY): Payer: Medicare PPO | Admitting: Physical Therapy

## 2021-12-16 DIAGNOSIS — R262 Difficulty in walking, not elsewhere classified: Secondary | ICD-10-CM

## 2021-12-16 DIAGNOSIS — M1711 Unilateral primary osteoarthritis, right knee: Secondary | ICD-10-CM

## 2021-12-16 DIAGNOSIS — Z96651 Presence of right artificial knee joint: Secondary | ICD-10-CM

## 2021-12-16 NOTE — Therapy (Signed)
OUTPATIENT PHYSICAL THERAPY Treatment Note   Patient Name: Shawna Lewis MRN: [id number] DOB:September 02, 1950, 71 y.o., female Today's Date: 12/16/2021   PT End of Session - 12/16/21 1325     Visit Number 2    Number of Visits 8    Date for PT Re-Evaluation 01/08/22    Authorization Type [employer] Medicare Choice;  auth required; no VL    Authorization Time Period 8 visits approved 12/11/21 - 01/08/22    Progress Note Due on Visit 10    PT Start Time 1325   late arrival/delayed check in   PT Stop Time 1405    PT Time Calculation (min) 40 min    Activity Tolerance Patient tolerated treatment well    Behavior During Therapy WFL for tasks assessed/performed             Past Medical History:  Diagnosis Date   Anemia    Arthritis    DDD (degenerative disc disease), lumbar    Fibromyalgia    GERD (gastroesophageal reflux disease)    Heart murmur    Hypertension    Migraine    OCD (obsessive compulsive disorder)    Osteomyelitis ([place])    Past Surgical History:  Procedure Laterality Date   ANKLE FRACTURE SURGERY  2018   BALLOON DILATION  05/23/2021   Procedure: BALLOON DILATION;  Surgeon: Harvel Quale, MD;  [facility];  Service: Gastroenterology;;  ascending colon strictures   BIOPSY  05/23/2021   Procedure: BIOPSY;  Surgeon: Harvel Quale, MD;  [facility];  Service: Gastroenterology;;   BREAST BIOPSY  1995   CATARACT EXTRACTION     [place]   COLONOSCOPY WITH PROPOFOL N/A 05/23/2021   Procedure: COLONOSCOPY WITH PROPOFOL;  Surgeon: Harvel Quale, MD;  [facility];  Service: Gastroenterology;  Laterality: N/A;  10:10   ESOPHAGOGASTRODUODENOSCOPY (EGD) WITH PROPOFOL N/A 05/23/2021   Procedure: ESOPHAGOGASTRODUODENOSCOPY (EGD) WITH PROPOFOL;  Surgeon: Harvel Quale, MD;  [facility];  Service: Gastroenterology;  Laterality: N/A;   IR LUMBAR DISC ASPIRATION W/IMG GUIDE   06/22/2019   KNEE ARTHROSCOPY W/ MENISCAL REPAIR     RADIOFREQUENCY ABLATION NERVES     TONSILLECTOMY  1974   TOTAL KNEE ARTHROPLASTY Right 12/09/2021   Procedure: TOTAL KNEE ARTHROPLASTY;  Surgeon: Renette Butters, MD;  [facility]: [facility];  Service: Orthopedics;  Laterality: Right;   Patient Active Problem List   Diagnosis Date Noted   S/P total knee arthroplasty, right 12/09/2021   Positive FIT (fecal immunochemical test) 05/14/2021   Anemia 05/14/2021   Therapeutic drug monitoring 06/30/2019   Chronic left lumbar radiculopathy 06/21/2019   Diskitis 06/20/2019   Osteomyelitis ([place]) 06/20/2019   HTN (hypertension) 06/20/2019   DDD (degenerative disc disease), lumbar    Migraine    Chronic low back pain    Central stenosis of spinal canal     PCP: Allyn Kenner, MD  REFERRING PROVIDER: Edmonia Lynch, MD  REFERRING DIAG: PT eval/tx for post op rt TKR (DOS 12/09/21) schedule for 12/11/21 or 12/12/21 per Edmonia Lynch, MD   OASTEOARTHRITIS RIGHT KNEE   THERAPY DIAG:  Primary osteoarthritis of right knee  Difficulty in walking, not elsewhere classified  Total knee replacement status, right  Rationale for Evaluation and Treatment Rehabilitation  ONSET DATE: 12/09/2021  SUBJECTIVE:   SUBJECTIVE STATEMENT: Exercises are going well. Doing 2x/day. Incision stinging sometimes and she takes breaks when it does it.   PERTINENT HISTORY: Left  knee needs TKA; 15 years ago arthroscopic surgery Left knee. Back issues;  Fell and fractured Right ankle 2019   PAIN:  Are you having pain? Yes: NPRS scale: 4/10 Pain location: Right knee Pain description: stinging, sore, aching Aggravating factors: movement Relieving factors: ice, pain meds  PRECAUTIONS: Fall  WEIGHT BEARING RESTRICTIONS No WBAT  FALLS:  Has patient fallen in last 6 months? No    OCCUPATION: retired  PLOF: Independent  PATIENT GOALS get my knee working well.    OBJECTIVE:   DIAGNOSTIC FINDINGS:  CLINICAL DATA:  Status post right knee arthroplasty   EXAM: PORTABLE RIGHT KNEE - 1-2 VIEW   COMPARISON:  None Available.   FINDINGS: Status post right knee total arthroplasty with expected overlying postoperative change. No evidence of perihardware fracture or component malpositioning.   IMPRESSION: Status post right knee total arthroplasty with expected overlying postoperative change. No evidence of perihardware fracture or component malpositioning.    PATIENT SURVEYS:  FOTO 40  COGNITION:  Overall cognitive status: Within functional limits for tasks assessed     SENSATION: Neuropathy in feet  POSTURE: flexed trunk   PALPATION: General soreness normal for this time s/p Right TKA  LOWER EXTREMITY ROM:  Active ROM Right eval Left eval Right  12/16/21  Hip flexion     Hip extension     Hip abduction     Hip adduction     Hip internal rotation     Hip external rotation     Knee flexion 58  86  Knee extension -10  Lacking 5  Ankle dorsiflexion     Ankle plantarflexion     Ankle inversion     Ankle eversion      (Blank rows = not tested)  LOWER EXTREMITY MMT:  MMT Right eval Left eval  Hip flexion    Hip extension    Hip abduction    Hip adduction    Hip internal rotation    Hip external rotation    Knee flexion    Knee extension Fair quad set   Ankle dorsiflexion    Ankle plantarflexion    Ankle inversion    Ankle eversion     (Blank rows = not tested)    GAIT: Distance walked: 50 Assistive device utilized: Walker - 2 wheeled Level of assistance: SBA Comments: wearing leg immobilizer Right knee; antalgic gait; step to gait    TODAY'S TREATMENT: 12/16/21 Bike rocking 2 minutes for ROM and warm up Lacking 5 to 84 improves to 86 flexion with heel slides Heel slides long sitting 2x 10 5-10 second holds Quad set 10 x 10 second holds SAQ 10 x 5 second holds RLE LAQ 2x 10 x 5 second holds RLE Hamstring isometric 2x 10 with 5 second  holds     PATIENT EDUCATION:  Education details: HEP Person educated: Patient Education method: Explanation, Demonstration, and Handouts Education comprehension: verbalized understanding, returned demonstration, and needs further education   HOME EXERCISE PROGRAM: Access Code: [email or web address] URL: [email or web address] Date: 12/11/2021 Prepared by: [facility]  Exercises - Supine Ankle Pumps  - 2-3 x daily - 7 x weekly - 2 sets - 10 reps - Long Sitting Quad Set  - 2-3 x daily - 7 x weekly - 2 sets - 10 reps - Supine Hip Abduction AROM  - 2-3 x daily - 7 x weekly - 3 sets - 10 reps - Supine Heel Slide  - 2-3 x daily - 7 x weekly - 3 sets -   10 reps - Seated Knee Flexion Extension AROM   - 2-3 x daily - 7 x weekly - 3 sets - 10 reps - Seated Heel Toe Raises  - 2-3 x daily - 7 x weekly - 3 sets - 10 reps 12/16/21 - Seated Long Arc Quad  - 2 x daily - 7 x weekly - 2 sets - 10 reps - 5 second hold - Long Sitting Hamstring Set  - 2 x daily - 7 x weekly - 2 sets - 10 reps - 5 second holds hold  ASSESSMENT:  CLINICAL IMPRESSION: Began session with bike rocking for dynamic warm and ROM but patient limited to only one position of flexion due to L knee pain and ROM restriction not allowing for stretch for R knee. Patient demonstrating improving ROM from lacking 5 to 84 following bike. Continued with L knee mobility and strengthening which is tolerated well with moderate fatigue following.  Patient will continue to benefit from physical therapy in order to improve function and reduce impairment.    OBJECTIVE IMPAIRMENTS Abnormal gait, decreased activity tolerance, decreased balance, decreased endurance, decreased mobility, difficulty walking, decreased ROM, decreased strength, hypomobility, increased edema, increased fascial restrictions, impaired perceived functional ability, impaired flexibility, and pain.   ACTIVITY LIMITATIONS carrying, lifting, bending, sitting, standing,  squatting, sleeping, stairs, transfers, bed mobility, bathing, toileting, and dressing  PARTICIPATION LIMITATIONS: meal prep, cleaning, laundry, medication management, personal finances, driving, shopping, community activity, yard work, school, and church  PERSONAL FACTORS Age, Fitness, and Time since onset of injury/illness/exacerbation are also affecting patient's functional outcome.   REHAB POTENTIAL: Good  CLINICAL DECISION MAKING: Stable/uncomplicated  EVALUATION COMPLEXITY: Low   GOALS: Goals reviewed with patient? Yes  SHORT TERM GOALS: Target date: 01/04/2022    patient will be independent with initial HEP Baseline: Goal status: MET  2.   Patient will improve Right knee ROM to -8 to 90 to improve functional mobility in home  Baseline:  Knee flexion 58  Knee extension -10   Goal status: IN PROGRESS  LONG TERM GOALS: Target date: 01/08/2022    Patient will be independent with advanced HEP and self management strategies to improve quality of life and functional outcomes.  Baseline:  Goal status: IN PROGRESS  2.   Patient with improved Right knee ROM to -5 to 120  to be able to safely navigate stairs   Baseline:  Knee flexion 58  Knee extension -10   Goal status: IN PROGRESS  3.  Patient will increase Right knee MMTs to 5/5 to promote return to ambulation community distances with minimal deviation.   Baseline:  Goal status: IN PROGRESS  4.   Patient will improve FOTO score to predicted value to demonstrate improved functional mobility.  Baseline: 40 Goal status: IN PROGRESS PLAN: PT FREQUENCY: 2x/week  PT DURATION: 4 weeks  PLANNED INTERVENTIONS: Therapeutic exercises, Therapeutic activity, Neuromuscular re-education, Balance training, Gait training, Patient/Family education, Joint manipulation, Joint mobilization, Stair training, Vestibular training, Canalith repositioning, Orthotic/Fit training, DME instructions, Aquatic Therapy, Dry Needling,  Electrical stimulation, Spinal manipulation, Spinal mobilization, Cryotherapy, Moist heat, scar mobilization, Taping, Traction, Ultrasound, Biofeedback, Manual therapy, and Re-evaluation  PLAN FOR NEXT SESSION: Review HEP and goals; progress as able Sees MD in 2 weeks   2:13 PM, 12/16/21 Mearl Latin PT, DPT Physical Therapist at [facility]

## 2021-12-18 ENCOUNTER — Ambulatory Visit (HOSPITAL_COMMUNITY): Payer: Medicare PPO | Attending: Orthopedic Surgery

## 2021-12-18 DIAGNOSIS — M1711 Unilateral primary osteoarthritis, right knee: Secondary | ICD-10-CM

## 2021-12-18 DIAGNOSIS — Z96651 Presence of right artificial knee joint: Secondary | ICD-10-CM | POA: Diagnosis not present

## 2021-12-18 DIAGNOSIS — R262 Difficulty in walking, not elsewhere classified: Secondary | ICD-10-CM

## 2021-12-18 NOTE — Therapy (Signed)
OUTPATIENT PHYSICAL THERAPY Treatment Note   Patient Name: Shawna Lewis MRN: [id number] DOB:[birth year], 71 y.o., female Today's Date: 12/18/2021   PT End of Session - 12/18/21 1302     Visit Number 3    Number of Visits 8    Date for PT Re-Evaluation 01/08/22    Authorization Type [employer] Choice;  auth required; no VL    Authorization Time Period 8 visits approved 12/11/21 - 01/08/22    Progress Note Due on Visit 10    PT Start Time 1302    PT Stop Time 1344    PT Time Calculation (min) 42 min    Activity Tolerance Patient tolerated treatment well    Behavior During Therapy WFL for tasks assessed/performed              Past Medical History:  Diagnosis Date   Anemia    Arthritis    DDD (degenerative disc disease), lumbar    Fibromyalgia    GERD (gastroesophageal reflux disease)    Heart murmur    Hypertension    Migraine    OCD (obsessive compulsive disorder)    Osteomyelitis ([place])    Past Surgical History:  Procedure Laterality Date   ANKLE FRACTURE SURGERY  2018   BALLOON DILATION  05/23/2021   Procedure: BALLOON DILATION;  Surgeon: Harvel Quale, MD;  [facility];  Service: Gastroenterology;;  ascending colon strictures   BIOPSY  05/23/2021   Procedure: BIOPSY;  Surgeon: Harvel Quale, MD;  [facility];  Service: Gastroenterology;;   BREAST BIOPSY  1995   CATARACT EXTRACTION     [place]   COLONOSCOPY WITH PROPOFOL N/A 05/23/2021   Procedure: COLONOSCOPY WITH PROPOFOL;  Surgeon: Harvel Quale, MD;  [facility];  Service: Gastroenterology;  Laterality: N/A;  10:10   ESOPHAGOGASTRODUODENOSCOPY (EGD) WITH PROPOFOL N/A 05/23/2021   Procedure: ESOPHAGOGASTRODUODENOSCOPY (EGD) WITH PROPOFOL;  Surgeon: Harvel Quale, MD;  [facility];  Service: Gastroenterology;  Laterality: N/A;   IR LUMBAR DISC ASPIRATION W/IMG GUIDE  06/22/2019   KNEE ARTHROSCOPY  W/ MENISCAL REPAIR     RADIOFREQUENCY ABLATION NERVES     TONSILLECTOMY  1974   TOTAL KNEE ARTHROPLASTY Right 12/09/2021   Procedure: TOTAL KNEE ARTHROPLASTY;  Surgeon: Renette Butters, MD;  Location: [facility];  Service: Orthopedics;  Laterality: Right;   Patient Active Problem List   Diagnosis Date Noted   S/P total knee arthroplasty, right 12/09/2021   Positive FIT (fecal immunochemical test) 05/14/2021   Anemia 05/14/2021   Therapeutic drug monitoring 06/30/2019   Chronic left lumbar radiculopathy 06/21/2019   Diskitis 06/20/2019   Osteomyelitis ([place] [facility]) 06/20/2019   HTN (hypertension) 06/20/2019   DDD (degenerative disc disease), lumbar    Migraine    Chronic low back pain    Central stenosis of spinal canal     PCP: Allyn Kenner, MD  REFERRING PROVIDER: Edmonia Lenon Kuennen, MD  REFERRING DIAG: PT eval/tx for post op rt TKR (DOS 12/09/21) schedule for 12/11/21 or 12/12/21 per Edmonia Evelean Bigler, MD   OASTEOARTHRITIS RIGHT KNEE   THERAPY DIAG:  Primary osteoarthritis of right knee  Difficulty in walking, not elsewhere classified  Total knee replacement status, right  Rationale for Evaluation and Treatment Rehabilitation  ONSET DATE: 12/09/2021  SUBJECTIVE:   SUBJECTIVE STATEMENT: Patient reports some shin pain today. No issues with HEP; has been using ice to assist with pain relief.  PERTINENT HISTORY: Left knee needs TKA;  15 years ago arthroscopic surgery Left knee. Back issues;  [place] and fractured Right ankle 2019   PAIN:  Are you having pain? Yes: NPRS scale: 5/10 Pain location: Right knee Pain description: stinging, sore, aching Aggravating factors: movement Relieving factors: ice, pain meds  PRECAUTIONS: Fall  WEIGHT BEARING RESTRICTIONS No WBAT  FALLS:  Has patient fallen in last 6 months? No    OCCUPATION: retired  PLOF: Independent  PATIENT GOALS get my knee working well.    OBJECTIVE:   DIAGNOSTIC FINDINGS: CLINICAL DATA:  Status post right  knee arthroplasty   EXAM: PORTABLE RIGHT KNEE - 1-2 VIEW   COMPARISON:  None Available.   FINDINGS: Status post right knee total arthroplasty with expected overlying postoperative change. No evidence of perihardware fracture or component malpositioning.   IMPRESSION: Status post right knee total arthroplasty with expected overlying postoperative change. No evidence of perihardware fracture or component malpositioning.    PATIENT SURVEYS:  FOTO 40  COGNITION:  Overall cognitive status: Within functional limits for tasks assessed     SENSATION: Neuropathy in feet  POSTURE: flexed trunk   PALPATION: General soreness normal for this time s/p Right TKA  LOWER EXTREMITY ROM:  Active ROM Right eval Left eval Right  12/16/21  Hip flexion     Hip extension     Hip abduction     Hip adduction     Hip internal rotation     Hip external rotation     Knee flexion 58  86  Knee extension -10  Lacking 5  Ankle dorsiflexion     Ankle plantarflexion     Ankle inversion     Ankle eversion      (Blank rows = not tested)  LOWER EXTREMITY MMT:  MMT Right eval Left eval  Hip flexion    Hip extension    Hip abduction    Hip adduction    Hip internal rotation    Hip external rotation    Knee flexion    Knee extension Fair quad set   Ankle dorsiflexion    Ankle plantarflexion    Ankle inversion    Ankle eversion     (Blank rows = not tested)    GAIT: Distance walked: 50 Assistive device utilized: Environmental consultant - 2 wheeled Level of assistance: SBA Comments: wearing leg immobilizer Right knee; antalgic gait; step to gait    TODAY'S TREATMENT: 12/18/21 Standing: Heel raises 2 x 10 Slant board 5 x 20" TKE GTB x 20  Hip Abduction x 10 Tandem stance x 30" each  Sit to stand 2 x 5 from mat table using UEs to push up to stand  Sitting: Heel slides x 10 LAQs x 10 therapist assist for fulcrum under the knee Sitting knee flexion 86 degrees today AAROM  Supine: Hip  ABD 2 x 10 Heel slides 2 x 10; second set with therapist overpressure SAQs 2 x 10     12/16/21 Bike rocking 2 minutes for ROM and warm up Lacking 5 to 84 improves to 86 flexion with heel slides Heel slides long sitting 2x 10 5-10 second holds Quad set 10 x 10 second holds SAQ 10 x 5 second holds RLE LAQ 2x 10 x 5 second holds RLE Hamstring isometric 2x 10 with 5 second holds     PATIENT EDUCATION:  Education details: HEP Person educated: Patient Education method: Consulting civil engineer, Media planner, and Handouts Education comprehension: verbalized understanding, returned demonstration, and needs further education   HOME EXERCISE PROGRAM: Access Code:  [email or web address] URL: [email or web address] Date: 12/11/2021 Prepared by: [facility]  Exercises - Supine Ankle Pumps  - 2-3 x daily - 7 x weekly - 2 sets - 10 reps - Long Sitting Quad Set  - 2-3 x daily - 7 x weekly - 2 sets - 10 reps - Supine Hip Abduction AROM  - 2-3 x daily - 7 x weekly - 3 sets - 10 reps - Supine Heel Slide  - 2-3 x daily - 7 x weekly - 3 sets - 10 reps - Seated Knee Flexion Extension AROM   - 2-3 x daily - 7 x weekly - 3 sets - 10 reps - Seated Heel Toe Raises  - 2-3 x daily - 7 x weekly - 3 sets - 10 reps 12/16/21 - Seated Long Arc Quad  - 2 x daily - 7 x weekly - 2 sets - 10 reps - 5 second hold - Long Sitting Hamstring Set  - 2 x daily - 7 x weekly - 2 sets - 10 reps - 5 second holds hold  ASSESSMENT:  CLINICAL IMPRESSION:  Patient reports some increased pain over the past few days; managing with ice and medication.  Added standing exercise today and introduced some balance exercise today.  Patient will some pain with increased weightbearing on the right leg and continued with forward flexed gait with ambulation.  Overall is progressing well with therapy. Patient will continue to benefit from physical therapy in order to improve function and reduce impairment.    OBJECTIVE IMPAIRMENTS Abnormal gait,  decreased activity tolerance, decreased balance, decreased endurance, decreased mobility, difficulty walking, decreased ROM, decreased strength, hypomobility, increased edema, increased fascial restrictions, impaired perceived functional ability, impaired flexibility, and pain.   ACTIVITY LIMITATIONS carrying, lifting, bending, sitting, standing, squatting, sleeping, stairs, transfers, bed mobility, bathing, toileting, and dressing  PARTICIPATION LIMITATIONS: meal prep, cleaning, laundry, medication management, personal finances, driving, shopping, community activity, yard work, school, and church  PERSONAL FACTORS Age, Fitness, and Time since onset of injury/illness/exacerbation are also affecting patient's functional outcome.   REHAB POTENTIAL: Good  CLINICAL DECISION MAKING: Stable/uncomplicated  EVALUATION COMPLEXITY: Low   GOALS: Goals reviewed with patient? Yes  SHORT TERM GOALS: Target date: 01/04/2022    patient will be independent with initial HEP Baseline: Goal status: MET  2.   Patient will improve Right knee ROM to -8 to 90 to improve functional mobility in home  Baseline:  Knee flexion 58  Knee extension -10   Goal status: IN PROGRESS  LONG TERM GOALS: Target date: 01/08/2022    Patient will be independent with advanced HEP and self management strategies to improve quality of life and functional outcomes.  Baseline:  Goal status: IN PROGRESS  2.   Patient with improved Right knee ROM to -5 to 120  to be able to safely navigate stairs   Baseline:  Knee flexion 58  Knee extension -10   Goal status: IN PROGRESS  3.  Patient will increase Right knee MMTs to 5/5 to promote return to ambulation community distances with minimal deviation.   Baseline:  Goal status: IN PROGRESS  4.   Patient will improve FOTO score to predicted value to demonstrate improved functional mobility.  Baseline: 40 Goal status: IN PROGRESS PLAN: PT FREQUENCY: 2x/week  PT  DURATION: 4 weeks  PLANNED INTERVENTIONS: Therapeutic exercises, Therapeutic activity, Neuromuscular re-education, Balance training, Gait training, Patient/Family education, Joint manipulation, Joint mobilization, Stair training, Vestibular training, Canalith repositioning, Orthotic/Fit training, DME instructions, Aquatic Therapy, Dry Needling,  Electrical stimulation, Spinal manipulation, Spinal mobilization, Cryotherapy, Moist heat, scar mobilization, Taping, Traction, Ultrasound, Biofeedback, Manual therapy, and Re-evaluation  PLAN FOR NEXT SESSION:  progress knee mobility and strengthening as able.  Sees MD next wed.  1:46 PM, 12/18/21 Dakia Schifano Small Kensey Luepke MPT [facility] [place] [phone]

## 2021-12-23 ENCOUNTER — Encounter (HOSPITAL_COMMUNITY): Payer: Self-pay

## 2021-12-23 ENCOUNTER — Ambulatory Visit (HOSPITAL_COMMUNITY): Payer: Medicare PPO | Attending: Orthopedic Surgery

## 2021-12-23 DIAGNOSIS — M1711 Unilateral primary osteoarthritis, right knee: Secondary | ICD-10-CM | POA: Insufficient documentation

## 2021-12-23 DIAGNOSIS — R262 Difficulty in walking, not elsewhere classified: Secondary | ICD-10-CM | POA: Diagnosis not present

## 2021-12-23 DIAGNOSIS — Z96651 Presence of right artificial knee joint: Secondary | ICD-10-CM | POA: Diagnosis not present

## 2021-12-23 NOTE — Therapy (Signed)
OUTPATIENT PHYSICAL THERAPY Treatment Note   Patient Name: Shawna Lewis MRN: [id number] DOB:27-Nov-1950, 71 y.o., female Today's Date: 12/23/2021   PT End of Session - 12/23/21 1409     Visit Number 4    Number of Visits 8    Date for PT Re-Evaluation 01/08/22    Authorization Type [employer] Choice;  auth required; no VL    Authorization Time Period 8 visits approved 12/11/21 - 01/08/22    Progress Note Due on Visit 10    PT Start Time 1320    PT Stop Time 1358    PT Time Calculation (min) 38 min    Activity Tolerance Patient tolerated treatment well    Behavior During Therapy WFL for tasks assessed/performed               Past Medical History:  Diagnosis Date   Anemia    Arthritis    DDD (degenerative disc disease), lumbar    Fibromyalgia    GERD (gastroesophageal reflux disease)    Heart murmur    Hypertension    Migraine    OCD (obsessive compulsive disorder)    Osteomyelitis ([place])    Past Surgical History:  Procedure Laterality Date   ANKLE FRACTURE SURGERY  2018   BALLOON DILATION  05/23/2021   Procedure: BALLOON DILATION;  Surgeon: Harvel Quale, MD;  [facility];  Service: Gastroenterology;;  ascending colon strictures   BIOPSY  05/23/2021   Procedure: BIOPSY;  Surgeon: Harvel Quale, MD;  [facility];  Service: Gastroenterology;;   BREAST BIOPSY  1995   CATARACT EXTRACTION     [place]   COLONOSCOPY WITH PROPOFOL N/A 05/23/2021   Procedure: COLONOSCOPY WITH PROPOFOL;  Surgeon: Harvel Quale, MD;  [facility];  Service: Gastroenterology;  Laterality: N/A;  10:10   ESOPHAGOGASTRODUODENOSCOPY (EGD) WITH PROPOFOL N/A 05/23/2021   Procedure: ESOPHAGOGASTRODUODENOSCOPY (EGD) WITH PROPOFOL;  Surgeon: Harvel Quale, MD;  [facility];  Service: Gastroenterology;  Laterality: N/A;   IR LUMBAR DISC ASPIRATION W/IMG GUIDE  06/22/2019   KNEE  ARTHROSCOPY W/ MENISCAL REPAIR     RADIOFREQUENCY ABLATION NERVES     TONSILLECTOMY  1974   TOTAL KNEE ARTHROPLASTY Right 12/09/2021   Procedure: TOTAL KNEE ARTHROPLASTY;  Surgeon: Renette Butters, MD;  Location: [facility];  Service: Orthopedics;  Laterality: Right;   Patient Active Problem List   Diagnosis Date Noted   S/P total knee arthroplasty, right 12/09/2021   Positive FIT (fecal immunochemical test) 05/14/2021   Anemia 05/14/2021   Therapeutic drug monitoring 06/30/2019   Chronic left lumbar radiculopathy 06/21/2019   Diskitis 06/20/2019   Osteomyelitis ([place]) 06/20/2019   HTN (hypertension) 06/20/2019   DDD (degenerative disc disease), lumbar    Migraine    Chronic low back pain    Central stenosis of spinal canal     PCP: Allyn Kenner, MD  REFERRING PROVIDER: Edmonia Lynch, MD  REFERRING DIAG: PT eval/tx for post op rt TKR (DOS 12/09/21) schedule for 12/11/21 or 12/12/21 per Edmonia Lynch, MD   OASTEOARTHRITIS RIGHT KNEE   THERAPY DIAG:  Primary osteoarthritis of right knee  Difficulty in walking, not elsewhere classified  Total knee replacement status, right  Rationale for Evaluation and Treatment Rehabilitation  ONSET DATE: 12/09/2021  SUBJECTIVE:   SUBJECTIVE STATEMENT: Pt stated she feels her knee is doing good considering she has surgery 2 weeks ago.  Pain scale 3/10 sore and stiffness.  PERTINENT HISTORY: Left knee needs TKA; 15 years ago arthroscopic surgery Left knee. Back issues;  [place] and fractured Right ankle 2019   PAIN:  Are you having pain? Yes: NPRS scale: 3/10 Pain location: Right knee Pain description: stinging, sore, aching Aggravating factors: movement Relieving factors: ice, pain meds  PRECAUTIONS: Fall  WEIGHT BEARING RESTRICTIONS No WBAT  FALLS:  Has patient fallen in last 6 months? No    OCCUPATION: retired  PLOF: Independent  PATIENT GOALS get my knee working well.    OBJECTIVE:   DIAGNOSTIC FINDINGS:  CLINICAL DATA:  Status post right knee arthroplasty   EXAM: PORTABLE RIGHT KNEE - 1-2 VIEW   COMPARISON:  None Available.   FINDINGS: Status post right knee total arthroplasty with expected overlying postoperative change. No evidence of perihardware fracture or component malpositioning.   IMPRESSION: Status post right knee total arthroplasty with expected overlying postoperative change. No evidence of perihardware fracture or component malpositioning.    PATIENT SURVEYS:  FOTO 40  COGNITION:  Overall cognitive status: Within functional limits for tasks assessed     SENSATION: Neuropathy in feet  POSTURE: flexed trunk   PALPATION: General soreness normal for this time s/p Right TKA  LOWER EXTREMITY ROM:  Active ROM Right eval Left eval Right  12/16/21 Right 12/23/21  Hip flexion      Hip extension      Hip abduction      Hip adduction      Hip internal rotation      Hip external rotation      Knee flexion 58  86 88  Knee extension -10  Lacking 5 Lacking 5 degrees  Ankle dorsiflexion      Ankle plantarflexion      Ankle inversion      Ankle eversion       (Blank rows = not tested)  LOWER EXTREMITY MMT:  MMT Right eval Left eval  Hip flexion    Hip extension    Hip abduction    Hip adduction    Hip internal rotation    Hip external rotation    Knee flexion    Knee extension Fair quad set   Ankle dorsiflexion    Ankle plantarflexion    Ankle inversion    Ankle eversion     (Blank rows = not tested)    GAIT: Distance walked: 50 Assistive device utilized: Environmental consultant - 2 wheeled Level of assistance: SBA Comments: wearing leg immobilizer Right knee; antalgic gait; step to gait    TODAY'S TREATMENT: 12/23/21: Rocking on bike with 5" holds end range for mobility x 5 min, seat 14 2MWT with RW 139f Heel/toe raises on incline slope 10x with UE support Knee drive on 8in step for flexion 10x 10" holds stretch only not painful TKE 20x 5" Slant board 2x  30"  AROM lacking 5-88 degrees Supine: Heel slide  SAQ 15x Seated: AAROM flexion  LAQ 10x5"  12/18/21 Standing: Heel raises 2 x 10 Slant board 5 x 20" TKE GTB x 20  Hip Abduction x 10 Tandem stance x 30" each  Sit to stand 2 x 5 from mat table using UEs to push up to stand  Sitting: Heel slides x 10 LAQs x 10 therapist assist for fulcrum under the knee Sitting knee flexion 86 degrees today AAROM  Supine: Hip ABD 2 x 10 Heel slides 2 x 10; second set with therapist overpressure SAQs 2 x 10     12/16/21 Bike rocking 2 minutes for  ROM and warm up Lacking 5 to 84 improves to 86 flexion with heel slides Heel slides long sitting 2x 10 5-10 second holds Quad set 10 x 10 second holds SAQ 10 x 5 second holds RLE LAQ 2x 10 x 5 second holds RLE Hamstring isometric 2x 10 with 5 second holds     PATIENT EDUCATION:  Education details: HEP Person educated: Patient Education method: Consulting civil engineer, Demonstration, and Handouts Education comprehension: verbalized understanding, returned demonstration, and needs further education   HOME EXERCISE PROGRAM: Access Code: [email or web address] URL: [email or web address] Date: 12/11/2021 Prepared by: [facility]  Exercises - Supine Ankle Pumps  - 2-3 x daily - 7 x weekly - 2 sets - 10 reps - Long Sitting Quad Set  - 2-3 x daily - 7 x weekly - 2 sets - 10 reps - Supine Hip Abduction AROM  - 2-3 x daily - 7 x weekly - 3 sets - 10 reps - Supine Heel Slide  - 2-3 x daily - 7 x weekly - 3 sets - 10 reps - Seated Knee Flexion Extension AROM   - 2-3 x daily - 7 x weekly - 3 sets - 10 reps - Seated Heel Toe Raises  - 2-3 x daily - 7 x weekly - 3 sets - 10 reps 12/16/21 - Seated Long Arc Quad  - 2 x daily - 7 x weekly - 2 sets - 10 reps - 5 second hold - Long Sitting Hamstring Set  - 2 x daily - 7 x weekly - 2 sets - 10 reps - 5 second holds hold  12/23/21:  Seated AAROM knee flexion  ASSESSMENT:  CLINICAL IMPRESSION: Pt progressing well  towards goals.  Session focus with knee mobility and quad strengthening.  Reviewed goals prior MD apt with MD Percell Miller.  Added knee drive and seated AAROM flexion for flexion.  AROM imporved 5-88 degrees (was 10-58degrees initial eval).  Good gait mechanics demonstrated during 2MWT with increased cadence and distance.  No reports of increased pain through session.      OBJECTIVE IMPAIRMENTS Abnormal gait, decreased activity tolerance, decreased balance, decreased endurance, decreased mobility, difficulty walking, decreased ROM, decreased strength, hypomobility, increased edema, increased fascial restrictions, impaired perceived functional ability, impaired flexibility, and pain.   ACTIVITY LIMITATIONS carrying, lifting, bending, sitting, standing, squatting, sleeping, stairs, transfers, bed mobility, bathing, toileting, and dressing  PARTICIPATION LIMITATIONS: meal prep, cleaning, laundry, medication management, personal finances, driving, shopping, community activity, yard work, school, and church  PERSONAL FACTORS Age, Fitness, and Time since onset of injury/illness/exacerbation are also affecting patient's functional outcome.   REHAB POTENTIAL: Good  CLINICAL DECISION MAKING: Stable/uncomplicated  EVALUATION COMPLEXITY: Low   GOALS: Goals reviewed with patient? Yes  SHORT TERM GOALS: Target date: 01/04/2022    patient will be independent with initial HEP Baseline: Goal status: MET  2.   Patient will improve Right knee ROM to -8 to 90 to improve functional mobility in home  Baseline:   Eval 12/23/21  Knee flexion 58 88  Knee extension -10 -5   Goal status: IN PROGRESS  LONG TERM GOALS: Target date: 01/08/2022    Patient will be independent with advanced HEP and self management strategies to improve quality of life and functional outcomes.  Baseline:  Goal status: IN PROGRESS  2.   Patient with improved Right knee ROM to -5 to 120  to be able to safely navigate  stairs   Baseline:  Knee flexion 58  Knee extension -10   Goal status:  IN PROGRESS  3.  Patient will increase Right knee MMTs to 5/5 to promote return to ambulation community distances with minimal deviation.   Baseline:  Goal status: IN PROGRESS  4.   Patient will improve FOTO score to predicted value to demonstrate improved functional mobility.  Baseline: 40 Goal status: IN PROGRESS PLAN: PT FREQUENCY: 2x/week  PT DURATION: 4 weeks  PLANNED INTERVENTIONS: Therapeutic exercises, Therapeutic activity, Neuromuscular re-education, Balance training, Gait training, Patient/Family education, Joint manipulation, Joint mobilization, Stair training, Vestibular training, Canalith repositioning, Orthotic/Fit training, DME instructions, Aquatic Therapy, Dry Needling, Electrical stimulation, Spinal manipulation, Spinal mobilization, Cryotherapy, Moist heat, scar mobilization, Taping, Traction, Ultrasound, Biofeedback, Manual therapy, and Re-evaluation  PLAN FOR NEXT SESSION:  progress knee mobility and strengthening as able. Sees MD tomorrow. Ihor Austin, LPTA/CLT; CBIS [phone]  2:12 PM, 12/23/21

## 2021-12-24 DIAGNOSIS — M1711 Unilateral primary osteoarthritis, right knee: Secondary | ICD-10-CM | POA: Diagnosis not present

## 2021-12-25 ENCOUNTER — Ambulatory Visit (HOSPITAL_COMMUNITY): Payer: Medicare PPO | Attending: Orthopedic Surgery

## 2021-12-25 ENCOUNTER — Encounter (HOSPITAL_COMMUNITY): Payer: Self-pay

## 2021-12-25 DIAGNOSIS — R262 Difficulty in walking, not elsewhere classified: Secondary | ICD-10-CM

## 2021-12-25 DIAGNOSIS — Z96651 Presence of right artificial knee joint: Secondary | ICD-10-CM | POA: Diagnosis not present

## 2021-12-25 DIAGNOSIS — M1711 Unilateral primary osteoarthritis, right knee: Secondary | ICD-10-CM | POA: Diagnosis not present

## 2021-12-25 NOTE — Therapy (Signed)
OUTPATIENT PHYSICAL THERAPY Treatment Note   Patient Name: Shawna Lewis MRN: [id number] DOB:October 31, 1950, 71 y.o., female Today's Date: 12/25/2021   PT End of Session - 12/25/21 1309     Visit Number 5    Number of Visits 8    Date for PT Re-Evaluation 01/08/22    Authorization Type [employer] Choice;  auth required; no VL    Authorization Time Period 8 visits approved 12/11/21 - 01/08/22    Progress Note Due on Visit 10    PT Start Time 1310    PT Stop Time 1358    PT Time Calculation (min) 48 min    Activity Tolerance Patient tolerated treatment well    Behavior During Therapy WFL for tasks assessed/performed               Past Medical History:  Diagnosis Date   Anemia    Arthritis    DDD (degenerative disc disease), lumbar    Fibromyalgia    GERD (gastroesophageal reflux disease)    Heart murmur    Hypertension    Migraine    OCD (obsessive compulsive disorder)    Osteomyelitis ([place])    Past Surgical History:  Procedure Laterality Date   ANKLE FRACTURE SURGERY  2018   BALLOON DILATION  05/23/2021   Procedure: BALLOON DILATION;  Surgeon: Harvel Quale, MD;  [facility];  Service: Gastroenterology;;  ascending colon strictures   BIOPSY  05/23/2021   Procedure: BIOPSY;  Surgeon: Harvel Quale, MD;  [facility];  Service: Gastroenterology;;   BREAST BIOPSY  1995   CATARACT EXTRACTION     [place]   COLONOSCOPY WITH PROPOFOL N/A 05/23/2021   Procedure: COLONOSCOPY WITH PROPOFOL;  Surgeon: Harvel Quale, MD;  [facility];  Service: Gastroenterology;  Laterality: N/A;  10:10   ESOPHAGOGASTRODUODENOSCOPY (EGD) WITH PROPOFOL N/A 05/23/2021   Procedure: ESOPHAGOGASTRODUODENOSCOPY (EGD) WITH PROPOFOL;  Surgeon: Harvel Quale, MD;  [facility];  Service: Gastroenterology;  Laterality: N/A;   IR LUMBAR DISC ASPIRATION W/IMG GUIDE  06/22/2019   KNEE  ARTHROSCOPY W/ MENISCAL REPAIR     RADIOFREQUENCY ABLATION NERVES     TONSILLECTOMY  1974   TOTAL KNEE ARTHROPLASTY Right 12/09/2021   Procedure: TOTAL KNEE ARTHROPLASTY;  Surgeon: Renette Butters, MD;  Location: [facility];  Service: Orthopedics;  Laterality: Right;   Patient Active Problem List   Diagnosis Date Noted   S/P total knee arthroplasty, right 12/09/2021   Positive FIT (fecal immunochemical test) 05/14/2021   Anemia 05/14/2021   Therapeutic drug monitoring 06/30/2019   Chronic left lumbar radiculopathy 06/21/2019   Diskitis 06/20/2019   Osteomyelitis ([place]) 06/20/2019   HTN (hypertension) 06/20/2019   DDD (degenerative disc disease), lumbar    Migraine    Chronic low back pain    Central stenosis of spinal canal     PCP: Allyn Kenner, MD  REFERRING PROVIDER: Edmonia Lynch, MD  REFERRING DIAG: PT eval/tx for post op rt TKR (DOS 12/09/21) schedule for 12/11/21 or 12/12/21 per Edmonia Lynch, MD; Next apt 01/07/22.  OASTEOARTHRITIS RIGHT KNEE   THERAPY DIAG:  Primary osteoarthritis of right knee  Difficulty in walking, not elsewhere classified  Total knee replacement status, right  Rationale for Evaluation and Treatment Rehabilitation  ONSET DATE: 12/09/2021  SUBJECTIVE:   SUBJECTIVE STATEMENT: Pt stated MD happy with ROM.  Noted some redness close to incision, given antibiotics for 7 days.  Current pain scale 5/10,  achey pain  PERTINENT HISTORY: Left knee needs TKA; 15 years ago arthroscopic surgery Left knee. Back issues;  [place] and fractured Right ankle 2019   PAIN:  Are you having pain? Yes: NPRS scale: 3/10 Pain location: Right knee Pain description: stinging, sore, aching Aggravating factors: movement Relieving factors: ice, pain meds  PRECAUTIONS: Fall  WEIGHT BEARING RESTRICTIONS No WBAT  FALLS:  Has patient fallen in last 6 months? No    OCCUPATION: retired  PLOF: Independent  PATIENT GOALS get my knee working well.    OBJECTIVE:    DIAGNOSTIC FINDINGS: CLINICAL DATA:  Status post right knee arthroplasty   EXAM: PORTABLE RIGHT KNEE - 1-2 VIEW   COMPARISON:  None Available.   FINDINGS: Status post right knee total arthroplasty with expected overlying postoperative change. No evidence of perihardware fracture or component malpositioning.   IMPRESSION: Status post right knee total arthroplasty with expected overlying postoperative change. No evidence of perihardware fracture or component malpositioning.    PATIENT SURVEYS:  FOTO 40  COGNITION:  Overall cognitive status: Within functional limits for tasks assessed     SENSATION: Neuropathy in feet  POSTURE: flexed trunk   PALPATION: General soreness normal for this time s/p Right TKA  LOWER EXTREMITY ROM:  Active ROM Right eval Left eval Right  12/16/21 Right 12/23/21  Hip flexion      Hip extension      Hip abduction      Hip adduction      Hip internal rotation      Hip external rotation      Knee flexion 58  86 88  Knee extension -10  Lacking 5 Lacking 5 degrees  Ankle dorsiflexion      Ankle plantarflexion      Ankle inversion      Ankle eversion       (Blank rows = not tested)  LOWER EXTREMITY MMT:  MMT Right eval Left eval  Hip flexion    Hip extension    Hip abduction    Hip adduction    Hip internal rotation    Hip external rotation    Knee flexion    Knee extension Fair quad set   Ankle dorsiflexion    Ankle plantarflexion    Ankle inversion    Ankle eversion     (Blank rows = not tested)    GAIT: Distance walked: 50 Assistive device utilized: Environmental consultant - 2 wheeled Level of assistance: SBA Comments: wearing leg immobilizer Right knee; antalgic gait; step to gait    TODAY'S TREATMENT: 12/25/21 Rocking on bike with 5" holds end range for mobility x 5 min, seat 13 Gait with SPC 170f, good sequence, cueing for heel to toe mechanics and equal stride length.  Encouraged to begin inside home, if pain tolerable Knee  drive on 8in step for flexion 10x 10" holds stretch only not painful TKE 20x 5" with RTB Hamstring stretch on 12in step 3x 30" Rockerboard 1' each DF/PF and lateral Tandem stance x 30" each   Manual: Decongestive techqniues for edema control with LE elevated   Traced redness  Supine: 10 heel slides   AROM 5-88 degrees  12/23/21: Rocking on bike with 5" holds end range for mobility x 5 min, seat 14 2MWT with RW 1410fHeel/toe raises on incline slope 10x with UE support Knee drive on 8in step for flexion 10x 10" holds stretch only not painful TKE 20x 5" Slant board 2x 30"  AROM lacking 5-88 degrees Supine: Heel slide  SAQ 15x Seated: AAROM flexion  LAQ 10x5"  12/18/21 Standing: Heel raises 2 x 10 Slant board 5 x 20" TKE GTB x 20  Hip Abduction x 10 Tandem stance x 30" each  Sit to stand 2 x 5 from mat table using UEs to push up to stand  Sitting: Heel slides x 10 LAQs x 10 therapist assist for fulcrum under the knee Sitting knee flexion 86 degrees today AAROM  Supine: Hip ABD 2 x 10 Heel slides 2 x 10; second set with therapist overpressure SAQs 2 x 10     12/16/21 Bike rocking 2 minutes for ROM and warm up Lacking 5 to 84 improves to 86 flexion with heel slides Heel slides long sitting 2x 10 5-10 second holds Quad set 10 x 10 second holds SAQ 10 x 5 second holds RLE LAQ 2x 10 x 5 second holds RLE Hamstring isometric 2x 10 with 5 second holds     PATIENT EDUCATION:  Education details: HEP Person educated: Patient Education method: Consulting civil engineer, Media planner, and Handouts Education comprehension: verbalized understanding, returned demonstration, and needs further education   HOME EXERCISE PROGRAM: Access Code: [email or web address] URL: [email or web address] Date: 12/11/2021 Prepared by: [facility]  Exercises - Supine Ankle Pumps  - 2-3 x daily - 7 x weekly - 2 sets - 10 reps - Long Sitting Quad Set  - 2-3 x daily - 7 x weekly - 2 sets - 10 reps -  Supine Hip Abduction AROM  - 2-3 x daily - 7 x weekly - 3 sets - 10 reps - Supine Heel Slide  - 2-3 x daily - 7 x weekly - 3 sets - 10 reps - Seated Knee Flexion Extension AROM   - 2-3 x daily - 7 x weekly - 3 sets - 10 reps - Seated Heel Toe Raises  - 2-3 x daily - 7 x weekly - 3 sets - 10 reps 12/16/21 - Seated Long Arc Quad  - 2 x daily - 7 x weekly - 2 sets - 10 reps - 5 second hold - Long Sitting Hamstring Set  - 2 x daily - 7 x weekly - 2 sets - 10 reps - 5 second holds hold  12/23/21:  Seated AAROM knee flexion  ASSESSMENT:  CLINICAL IMPRESSION: Continued session focus with knee mobility.  Pt walking some around dept without AD, increased limping noted.  Gait training with SPC, pt presents with good sequence with min cueing for heel to toe mechanics and equal stride length, encouraged to use cane inside per pain tolerance but to continue with RW outdoors and long distance.  Edema present proximal knee with noted redness surrounding incision.  Therapist traced redness to assess if redness grows, pt stated MD put her on antibiotic for a week.  Added retrograde decongestive massage for edema control.  Pt reports pain reduce following manual.      OBJECTIVE IMPAIRMENTS Abnormal gait, decreased activity tolerance, decreased balance, decreased endurance, decreased mobility, difficulty walking, decreased ROM, decreased strength, hypomobility, increased edema, increased fascial restrictions, impaired perceived functional ability, impaired flexibility, and pain.   ACTIVITY LIMITATIONS carrying, lifting, bending, sitting, standing, squatting, sleeping, stairs, transfers, bed mobility, bathing, toileting, and dressing  PARTICIPATION LIMITATIONS: meal prep, cleaning, laundry, medication management, personal finances, driving, shopping, community activity, yard work, school, and church  PERSONAL FACTORS Age, Fitness, and Time since onset of injury/illness/exacerbation are also affecting patient's  functional outcome.   REHAB POTENTIAL: Good  CLINICAL DECISION MAKING: Stable/uncomplicated  EVALUATION COMPLEXITY: Low  GOALS: Goals reviewed with patient? Yes  SHORT TERM GOALS: Target date: 01/04/2022    patient will be independent with initial HEP Baseline: Goal status: MET  2.   Patient will improve Right knee ROM to -8 to 90 to improve functional mobility in home  Baseline:   Eval 12/23/21  Knee flexion 58 88  Knee extension -10 -5   Goal status: IN PROGRESS  LONG TERM GOALS: Target date: 01/08/2022    Patient will be independent with advanced HEP and self management strategies to improve quality of life and functional outcomes.  Baseline:  Goal status: IN PROGRESS  2.   Patient with improved Right knee ROM to -5 to 120  to be able to safely navigate stairs   Baseline:  Knee flexion 58  Knee extension -10   Goal status: IN PROGRESS  3.  Patient will increase Right knee MMTs to 5/5 to promote return to ambulation community distances with minimal deviation.   Baseline:  Goal status: IN PROGRESS  4.   Patient will improve FOTO score to predicted value to demonstrate improved functional mobility.  Baseline: 40 Goal status: IN PROGRESS PLAN: PT FREQUENCY: 2x/week  PT DURATION: 4 weeks  PLANNED INTERVENTIONS: Therapeutic exercises, Therapeutic activity, Neuromuscular re-education, Balance training, Gait training, Patient/Family education, Joint manipulation, Joint mobilization, Stair training, Vestibular training, Canalith repositioning, Orthotic/Fit training, DME instructions, Aquatic Therapy, Dry Needling, Electrical stimulation, Spinal manipulation, Spinal mobilization, Cryotherapy, Moist heat, scar mobilization, Taping, Traction, Ultrasound, Biofeedback, Manual therapy, and Re-evaluation  PLAN FOR NEXT SESSION:  progress knee mobility and strengthening as able. Ihor Austin, LPTA/CLT; Delana Meyer [phone]  6:53 PM, 12/25/21

## 2021-12-30 ENCOUNTER — Ambulatory Visit (HOSPITAL_COMMUNITY): Payer: Medicare PPO | Attending: Orthopedic Surgery | Admitting: Physical Therapy

## 2021-12-30 ENCOUNTER — Encounter (HOSPITAL_COMMUNITY): Payer: Self-pay | Admitting: Physical Therapy

## 2021-12-30 DIAGNOSIS — Z96651 Presence of right artificial knee joint: Secondary | ICD-10-CM | POA: Diagnosis not present

## 2021-12-30 DIAGNOSIS — R262 Difficulty in walking, not elsewhere classified: Secondary | ICD-10-CM | POA: Insufficient documentation

## 2021-12-30 DIAGNOSIS — M1711 Unilateral primary osteoarthritis, right knee: Secondary | ICD-10-CM | POA: Insufficient documentation

## 2021-12-30 NOTE — Therapy (Signed)
OUTPATIENT PHYSICAL THERAPY Treatment Note   Patient Name: Shawna Lewis MRN: [id number] DOB:12-Nov-1950, 71 y.o., female Today's Date: 12/30/2021   PT End of Session - 12/30/21 1318     Visit Number 6    Number of Visits 8    Date for PT Re-Evaluation 01/08/22    Authorization Type [employer] Choice;  auth required; no VL    Authorization Time Period 8 visits approved 12/11/21 - 01/08/22    Progress Note Due on Visit 10    PT Start Time 0932    PT Stop Time 1356    PT Time Calculation (min) 39 min    Activity Tolerance Patient tolerated treatment well    Behavior During Therapy WFL for tasks assessed/performed               Past Medical History:  Diagnosis Date   Anemia    Arthritis    DDD (degenerative disc disease), lumbar    Fibromyalgia    GERD (gastroesophageal reflux disease)    Heart murmur    Hypertension    Migraine    OCD (obsessive compulsive disorder)    Osteomyelitis ([place])    Past Surgical History:  Procedure Laterality Date   ANKLE FRACTURE SURGERY  2018   BALLOON DILATION  05/23/2021   Procedure: BALLOON DILATION;  Surgeon: Harvel Quale, MD;  [facility];  Service: Gastroenterology;;  ascending colon strictures   BIOPSY  05/23/2021   Procedure: BIOPSY;  Surgeon: Harvel Quale, MD;  [facility];  Service: Gastroenterology;;   BREAST BIOPSY  1995   CATARACT EXTRACTION     [place]   COLONOSCOPY WITH PROPOFOL N/A 05/23/2021   Procedure: COLONOSCOPY WITH PROPOFOL;  Surgeon: Harvel Quale, MD;  [facility];  Service: Gastroenterology;  Laterality: N/A;  10:10   ESOPHAGOGASTRODUODENOSCOPY (EGD) WITH PROPOFOL N/A 05/23/2021   Procedure: ESOPHAGOGASTRODUODENOSCOPY (EGD) WITH PROPOFOL;  Surgeon: Harvel Quale, MD;  [facility];  Service: Gastroenterology;  Laterality: N/A;   IR LUMBAR DISC ASPIRATION W/IMG GUIDE  06/22/2019   KNEE  ARTHROSCOPY W/ MENISCAL REPAIR     RADIOFREQUENCY ABLATION NERVES     TONSILLECTOMY  1974   TOTAL KNEE ARTHROPLASTY Right 12/09/2021   Procedure: TOTAL KNEE ARTHROPLASTY;  Surgeon: Renette Butters, MD;  Location: [facility];  Service: Orthopedics;  Laterality: Right;   Patient Active Problem List   Diagnosis Date Noted   S/P total knee arthroplasty, right 12/09/2021   Positive FIT (fecal immunochemical test) 05/14/2021   Anemia 05/14/2021   Therapeutic drug monitoring 06/30/2019   Chronic left lumbar radiculopathy 06/21/2019   Diskitis 06/20/2019   Osteomyelitis ([place]) 06/20/2019   HTN (hypertension) 06/20/2019   DDD (degenerative disc disease), lumbar    Migraine    Chronic low back pain    Central stenosis of spinal canal     PCP: Allyn Kenner, MD  REFERRING PROVIDER: Edmonia Lynch, MD  REFERRING DIAG: PT eval/tx for post op rt TKR (DOS 12/09/21) schedule for 12/11/21 or 12/12/21 per Edmonia Lynch, MD; Next apt 01/07/22.  OASTEOARTHRITIS RIGHT KNEE   THERAPY DIAG:  No diagnosis found.  Rationale for Evaluation and Treatment Rehabilitation  ONSET DATE: 12/09/2021  SUBJECTIVE:   SUBJECTIVE STATEMENT: Her thigh is achy. Her motion progressed well at first but has been slow lately.   PERTINENT HISTORY: Left knee needs TKA; 15 years ago arthroscopic surgery Left knee. Back issues;  Fell and fractured Right ankle  2019   PAIN:  Are you having pain? Yes: NPRS scale: 6/10 Pain location: Right knee Pain description: stinging, sore, aching Aggravating factors: movement Relieving factors: ice, pain meds  PRECAUTIONS: Fall  WEIGHT BEARING RESTRICTIONS No WBAT  FALLS:  Has patient fallen in last 6 months? No    OCCUPATION: retired  PLOF: Independent  PATIENT GOALS get my knee working well.    OBJECTIVE:   DIAGNOSTIC FINDINGS: CLINICAL DATA:  Status post right knee arthroplasty   EXAM: PORTABLE RIGHT KNEE - 1-2 VIEW   COMPARISON:  None Available.    FINDINGS: Status post right knee total arthroplasty with expected overlying postoperative change. No evidence of perihardware fracture or component malpositioning.   IMPRESSION: Status post right knee total arthroplasty with expected overlying postoperative change. No evidence of perihardware fracture or component malpositioning.    PATIENT SURVEYS:  FOTO 40  COGNITION:  Overall cognitive status: Within functional limits for tasks assessed     SENSATION: Neuropathy in feet  POSTURE: flexed trunk   PALPATION: General soreness normal for this time s/p Right TKA  LOWER EXTREMITY ROM:  Active ROM Right eval Left eval Right  12/16/21 Right 12/23/21 Right  12/30/21  Hip flexion       Hip extension       Hip abduction       Hip adduction       Hip internal rotation       Hip external rotation       Knee flexion 58  86 88 90  Knee extension -10  Lacking 5 Lacking 5 degrees Lacking 5  Ankle dorsiflexion       Ankle plantarflexion       Ankle inversion       Ankle eversion        (Blank rows = not tested)  LOWER EXTREMITY MMT:  MMT Right eval Left eval  Hip flexion    Hip extension    Hip abduction    Hip adduction    Hip internal rotation    Hip external rotation    Knee flexion    Knee extension Fair quad set   Ankle dorsiflexion    Ankle plantarflexion    Ankle inversion    Ankle eversion     (Blank rows = not tested)    GAIT: Distance walked: 50 Assistive device utilized: Environmental consultant - 2 wheeled Level of assistance: SBA Comments: wearing leg immobilizer Right knee; antalgic gait; step to gait    TODAY'S TREATMENT: 12/30/21 Rocking on bike with 5" holds end range for mobility x 5 min, seat 13 Step up 4 inch 2x 10  Lateral step up 4 inch 2x 10  STS 2x 10  Rockerboard A/P and lateral - 1 minute x 2 each direction Hamstring curl isometric in seated with green ball 10 x 10 second holds   12/25/21 Rocking on bike with 5" holds end range for mobility x  5 min, seat 13 Gait with SPC 165f, good sequence, cueing for heel to toe mechanics and equal stride length.  Encouraged to begin inside home, if pain tolerable Knee drive on 8in step for flexion 10x 10" holds stretch only not painful TKE 20x 5" with RTB Hamstring stretch on 12in step 3x 30" Rockerboard 1' each DF/PF and lateral Tandem stance x 30" each   Manual: Decongestive techqniues for edema control with LE elevated   Traced redness  Supine: 10 heel slides   AROM 5-88 degrees  12/23/21: Rocking on bike with  5" holds end range for mobility x 5 min, seat 14 2MWT with RW 155f Heel/toe raises on incline slope 10x with UE support Knee drive on 8in step for flexion 10x 10" holds stretch only not painful TKE 20x 5" Slant board 2x 30"  AROM lacking 5-88 degrees Supine: Heel slide  SAQ 15x Seated: AAROM flexion  LAQ 10x5"  12/18/21 Standing: Heel raises 2 x 10 Slant board 5 x 20" TKE GTB x 20  Hip Abduction x 10 Tandem stance x 30" each  Sit to stand 2 x 5 from mat table using UEs to push up to stand  Sitting: Heel slides x 10 LAQs x 10 therapist assist for fulcrum under the knee Sitting knee flexion 86 degrees today AAROM  Supine: Hip ABD 2 x 10 Heel slides 2 x 10; second set with therapist overpressure SAQs 2 x 10     12/16/21 Bike rocking 2 minutes for ROM and warm up Lacking 5 to 84 improves to 86 flexion with heel slides Heel slides long sitting 2x 10 5-10 second holds Quad set 10 x 10 second holds SAQ 10 x 5 second holds RLE LAQ 2x 10 x 5 second holds RLE Hamstring isometric 2x 10 with 5 second holds     PATIENT EDUCATION:  Education details: HEP Person educated: Patient Education method: EConsulting civil engineer DMedia planner and Handouts Education comprehension: verbalized understanding, returned demonstration, and needs further education   HOME EXERCISE PROGRAM: Access Code: [email or web address]: [email or web address] Date: 12/11/2021 Prepared by:  [facility]  Exercises - Supine Ankle Pumps  - 2-3 x daily - 7 x weekly - 2 sets - 10 reps - Long Sitting Quad Set  - 2-3 x daily - 7 x weekly - 2 sets - 10 reps - Supine Hip Abduction AROM  - 2-3 x daily - 7 x weekly - 3 sets - 10 reps - Supine Heel Slide  - 2-3 x daily - 7 x weekly - 3 sets - 10 reps - Seated Knee Flexion Extension AROM   - 2-3 x daily - 7 x weekly - 3 sets - 10 reps - Seated Heel Toe Raises  - 2-3 x daily - 7 x weekly - 3 sets - 10 reps 12/16/21 - Seated Long Arc Quad  - 2 x daily - 7 x weekly - 2 sets - 10 reps - 5 second hold - Long Sitting Hamstring Set  - 2 x daily - 7 x weekly - 2 sets - 10 reps - 5 second holds hold  12/23/21:  Seated AAROM knee flexion 6/13 STS  ASSESSMENT:  CLINICAL IMPRESSION: Began session on bike for dynamic warm up and ROM. Demonstrating slight improvement in flexion ROM but lacking TKE. Requires bilateral UE support  due to RLE weakness and relies on momentum with STS. No change in ROM at end of session. Patient will continue to benefit from physical therapy in order to improve function and reduce impairment.    OBJECTIVE IMPAIRMENTS Abnormal gait, decreased activity tolerance, decreased balance, decreased endurance, decreased mobility, difficulty walking, decreased ROM, decreased strength, hypomobility, increased edema, increased fascial restrictions, impaired perceived functional ability, impaired flexibility, and pain.   ACTIVITY LIMITATIONS carrying, lifting, bending, sitting, standing, squatting, sleeping, stairs, transfers, bed mobility, bathing, toileting, and dressing  PARTICIPATION LIMITATIONS: meal prep, cleaning, laundry, medication management, personal finances, driving, shopping, community activity, yard work, school, and church  PERSONAL FACTORS Age, Fitness, and Time since onset of injury/illness/exacerbation are also affecting patient's functional outcome.   REHAB  POTENTIAL: Good  CLINICAL DECISION MAKING:  Stable/uncomplicated  EVALUATION COMPLEXITY: Low   GOALS: Goals reviewed with patient? Yes  SHORT TERM GOALS: Target date: 01/04/2022    patient will be independent with initial HEP Baseline: Goal status: MET  2.   Patient will improve Right knee ROM to -8 to 90 to improve functional mobility in home  Baseline:   Eval 12/23/21  Knee flexion 58 88  Knee extension -10 -5   Goal status: IN PROGRESS  LONG TERM GOALS: Target date: 01/08/2022    Patient will be independent with advanced HEP and self management strategies to improve quality of life and functional outcomes.  Baseline:  Goal status: IN PROGRESS  2.   Patient with improved Right knee ROM to -5 to 120  to be able to safely navigate stairs   Baseline:  Knee flexion 58  Knee extension -10   Goal status: IN PROGRESS  3.  Patient will increase Right knee MMTs to 5/5 to promote return to ambulation community distances with minimal deviation.   Baseline:  Goal status: IN PROGRESS  4.   Patient will improve FOTO score to predicted value to demonstrate improved functional mobility.  Baseline: 40 Goal status: IN PROGRESS PLAN: PT FREQUENCY: 2x/week  PT DURATION: 4 weeks  PLANNED INTERVENTIONS: Therapeutic exercises, Therapeutic activity, Neuromuscular re-education, Balance training, Gait training, Patient/Family education, Joint manipulation, Joint mobilization, Stair training, Vestibular training, Canalith repositioning, Orthotic/Fit training, DME instructions, Aquatic Therapy, Dry Needling, Electrical stimulation, Spinal manipulation, Spinal mobilization, Cryotherapy, Moist heat, scar mobilization, Taping, Traction, Ultrasound, Biofeedback, Manual therapy, and Re-evaluation  PLAN FOR NEXT SESSION:  progress knee mobility and strengthening as able.   1:18 PM, 12/30/21 Mearl Latin PT, DPT Physical Therapist at [facility]

## 2021-12-31 ENCOUNTER — Encounter (HOSPITAL_COMMUNITY): Payer: Self-pay

## 2021-12-31 ENCOUNTER — Ambulatory Visit (HOSPITAL_COMMUNITY): Payer: Medicare PPO | Attending: Internal Medicine

## 2021-12-31 DIAGNOSIS — Z96651 Presence of right artificial knee joint: Secondary | ICD-10-CM | POA: Diagnosis not present

## 2021-12-31 DIAGNOSIS — R262 Difficulty in walking, not elsewhere classified: Secondary | ICD-10-CM

## 2021-12-31 DIAGNOSIS — Z471 Aftercare following joint replacement surgery: Secondary | ICD-10-CM | POA: Insufficient documentation

## 2021-12-31 DIAGNOSIS — M1711 Unilateral primary osteoarthritis, right knee: Secondary | ICD-10-CM | POA: Insufficient documentation

## 2021-12-31 NOTE — Therapy (Signed)
OUTPATIENT PHYSICAL THERAPY Treatment Note   Patient Name: Shawna Lewis MRN: [id number] DOB:[birth year], 71 y.o., female Today's Date: 12/31/2021       Past Medical History:  Diagnosis Date   Anemia    Arthritis    DDD (degenerative disc disease), lumbar    Fibromyalgia    GERD (gastroesophageal reflux disease)    Heart murmur    Hypertension    Migraine    OCD (obsessive compulsive disorder)    Osteomyelitis ([place])    Past Surgical History:  Procedure Laterality Date   ANKLE FRACTURE SURGERY  2018   BALLOON DILATION  05/23/2021   Procedure: BALLOON DILATION;  Surgeon: Harvel Quale, MD;  [facility];  Service: Gastroenterology;;  ascending colon strictures   BIOPSY  05/23/2021   Procedure: BIOPSY;  Surgeon: Harvel Quale, MD;  [facility];  Service: Gastroenterology;;   BREAST BIOPSY  1995   CATARACT EXTRACTION     [place]   COLONOSCOPY WITH PROPOFOL N/A 05/23/2021   Procedure: COLONOSCOPY WITH PROPOFOL;  Surgeon: Harvel Quale, MD;  [facility];  Service: Gastroenterology;  Laterality: N/A;  10:10   ESOPHAGOGASTRODUODENOSCOPY (EGD) WITH PROPOFOL N/A 05/23/2021   Procedure: ESOPHAGOGASTRODUODENOSCOPY (EGD) WITH PROPOFOL;  Surgeon: Harvel Quale, MD;  [facility];  Service: Gastroenterology;  Laterality: N/A;   IR LUMBAR DISC ASPIRATION W/IMG GUIDE  06/22/2019   KNEE ARTHROSCOPY W/ MENISCAL REPAIR     RADIOFREQUENCY ABLATION NERVES     TONSILLECTOMY  1974   TOTAL KNEE ARTHROPLASTY Right 12/09/2021   Procedure: TOTAL KNEE ARTHROPLASTY;  Surgeon: Renette Butters, MD;  Location: [facility];  Service: Orthopedics;  Laterality: Right;   Patient Active Problem List   Diagnosis Date Noted   S/P total knee arthroplasty, right 12/09/2021   Positive FIT (fecal immunochemical test) 05/14/2021   Anemia 05/14/2021   Therapeutic drug monitoring 06/30/2019   Chronic left  lumbar radiculopathy 06/21/2019   Diskitis 06/20/2019   Osteomyelitis ([place]) 06/20/2019   HTN (hypertension) 06/20/2019   DDD (degenerative disc disease), lumbar    Migraine    Chronic low back pain    Central stenosis of spinal canal     PCP: Allyn Kenner, MD  REFERRING PROVIDER: Edmonia Lynch, MD  REFERRING DIAG: PT eval/tx for post op rt TKR (DOS 12/09/21) schedule for 12/11/21 or 12/12/21 per Edmonia Lynch, MD; Next apt 01/07/22.  OASTEOARTHRITIS RIGHT KNEE   THERAPY DIAG:  No diagnosis found.  Rationale for Evaluation and Treatment Rehabilitation  ONSET DATE: 12/09/2021  SUBJECTIVE:   SUBJECTIVE STATEMENT: Pt stated her son's dog ran into her leg with a bruise for about a week, that's sore there.  Pain scale 4/10, sore knee.  Pt stated her motion is getting more difficult to move, stated it was really easy at beginning.  Pt stated her Lt knee feels unstable, has been relying on it a lot.  Reports she has began walking with cane at home, no reports of LOB episodes.  Stated today was last day of antibiotics.  PERTINENT HISTORY: Left knee needs TKA; 15 years ago arthroscopic surgery Left knee. Back issues;  [place] and fractured Right ankle 2019   PAIN:  Are you having pain? Yes: NPRS scale: 4/10 Pain location: Right knee Pain description: stinging, sore, aching Aggravating factors: movement Relieving factors: ice, pain meds  PRECAUTIONS: Fall  WEIGHT BEARING RESTRICTIONS No WBAT  FALLS:  Has patient fallen in last 6 months? No  OCCUPATION: retired  PLOF: Independent  PATIENT GOALS get my knee working well.    OBJECTIVE:   DIAGNOSTIC FINDINGS: CLINICAL DATA:  Status post right knee arthroplasty   EXAM: PORTABLE RIGHT KNEE - 1-2 VIEW   COMPARISON:  None Available.   FINDINGS: Status post right knee total arthroplasty with expected overlying postoperative change. No evidence of perihardware fracture or component malpositioning.    IMPRESSION: Status post right knee total arthroplasty with expected overlying postoperative change. No evidence of perihardware fracture or component malpositioning.    PATIENT SURVEYS:  FOTO 40  COGNITION:  Overall cognitive status: Within functional limits for tasks assessed     SENSATION: Neuropathy in feet  POSTURE: flexed trunk   PALPATION: General soreness normal for this time s/p Right TKA  LOWER EXTREMITY ROM:  Active ROM Right eval Left eval Right  12/16/21 Right 12/23/21 Right  12/30/21 Right 12/31/21  Hip flexion        Hip extension        Hip abduction        Hip adduction        Hip internal rotation        Hip external rotation        Knee flexion 58  86 88 90 94  Knee extension -10  Lacking 5 Lacking 5 degrees Lacking 5 Lacking 4 degrees  Ankle dorsiflexion        Ankle plantarflexion        Ankle inversion        Ankle eversion         (Blank rows = not tested)  LOWER EXTREMITY MMT:  MMT Right eval Left eval  Hip flexion    Hip extension    Hip abduction    Hip adduction    Hip internal rotation    Hip external rotation    Knee flexion    Knee extension Fair quad set   Ankle dorsiflexion    Ankle plantarflexion    Ankle inversion    Ankle eversion     (Blank rows = not tested)    GAIT: Distance walked: 50 Assistive device utilized: Environmental consultant - 2 wheeled Level of assistance: SBA Comments: wearing leg immobilizer Right knee; antalgic gait; step to gait    TODAY'S TREATMENT: 12/31/21: Rocking on bike 5, able to make full revolution backward, Lt knee blocks ability to go forward.    Standing: Rockerboard A/P and lateral - 1 minute x 2 each direction  Heel raises on incline slope 15x  Toe raises on incline slope 15x Knee drive on 41DE step for flexion 10" holds 10x  Hamstring stretch 3x 30" holds Prone: quad stretch with rope 2x 30"  Contract relax for flexion 3x 10" holds  Manual in supine with LE elevated: retrograde massage,  patella mobs all directions  Skin integrity, decreased redness and heat, dry skin distally    12/30/21 Rocking on bike with 5" holds end range for mobility x 5 min, seat 13 Step up 4 inch 2x 10  Lateral step up 4 inch 2x 10  STS 2x 10  Rockerboard A/P and lateral - 1 minute x 2 each direction Hamstring curl isometric in seated with green ball 10 x 10 second holds   12/25/21 Rocking on bike with 5" holds end range for mobility x 5 min, seat 13 Gait with SPC 180f, good sequence, cueing for heel to toe mechanics and equal stride length.  Encouraged to begin inside home, if pain tolerable Knee  drive on 8in step for flexion 10x 10" holds stretch only not painful TKE 20x 5" with RTB Hamstring stretch on 12in step 3x 30" Rockerboard 1' each DF/PF and lateral Tandem stance x 30" each   Manual: Decongestive techqniues for edema control with LE elevated   Traced redness  Supine: 10 heel slides   AROM 5-88 degrees  12/23/21: Rocking on bike with 5" holds end range for mobility x 5 min, seat 14 2MWT with RW 13f Heel/toe raises on incline slope 10x with UE support Knee drive on 8in step for flexion 10x 10" holds stretch only not painful TKE 20x 5" Slant board 2x 30"  AROM lacking 5-88 degrees Supine: Heel slide  SAQ 15x Seated: AAROM flexion  LAQ 10x5"  12/18/21 Standing: Heel raises 2 x 10 Slant board 5 x 20" TKE GTB x 20  Hip Abduction x 10 Tandem stance x 30" each  Sit to stand 2 x 5 from mat table using UEs to push up to stand  Sitting: Heel slides x 10 LAQs x 10 therapist assist for fulcrum under the knee Sitting knee flexion 86 degrees today AAROM  Supine: Hip ABD 2 x 10 Heel slides 2 x 10; second set with therapist overpressure SAQs 2 x 10     12/16/21 Bike rocking 2 minutes for ROM and warm up Lacking 5 to 84 improves to 86 flexion with heel slides Heel slides long sitting 2x 10 5-10 second holds Quad set 10 x 10 second holds SAQ 10 x 5 second holds  RLE LAQ 2x 10 x 5 second holds RLE Hamstring isometric 2x 10 with 5 second holds     PATIENT EDUCATION:  Education details: HEP Person educated: Patient Education method: EConsulting civil engineer DMedia planner and Handouts Education comprehension: verbalized understanding, returned demonstration, and needs further education   HOME EXERCISE PROGRAM: Access Code: [email or web address]: [email or web address] Date: 12/11/2021 Prepared by: [facility]  Exercises - Supine Ankle Pumps  - 2-3 x daily - 7 x weekly - 2 sets - 10 reps - Long Sitting Quad Set  - 2-3 x daily - 7 x weekly - 2 sets - 10 reps - Supine Hip Abduction AROM  - 2-3 x daily - 7 x weekly - 3 sets - 10 reps - Supine Heel Slide  - 2-3 x daily - 7 x weekly - 3 sets - 10 reps - Seated Knee Flexion Extension AROM   - 2-3 x daily - 7 x weekly - 3 sets - 10 reps - Seated Heel Toe Raises  - 2-3 x daily - 7 x weekly - 3 sets - 10 reps 12/16/21 - Seated Long Arc Quad  - 2 x daily - 7 x weekly - 2 sets - 10 reps - 5 second hold - Long Sitting Hamstring Set  - 2 x daily - 7 x weekly - 2 sets - 10 reps - 5 second holds hold  12/23/21:  Seated AAROM knee flexion 6/13 STS  ASSESSMENT:  CLINICAL IMPRESSION: Session focus with knee mobility, strengthening and edema control.  Pt presents with decreased eccentric control with STS and relies on momentum, elevated height for improve control with functional task, able to stand with hands on thigh.  Added prone quad stretch and contract/relax to improve flexion with good response.  EOS with manual retrograde massage for edema control.  AROM improved 4-94 degrees (was 5-90 last session.)   OBJECTIVE IMPAIRMENTS Abnormal gait, decreased activity tolerance, decreased balance, decreased endurance, decreased mobility, difficulty walking, decreased  ROM, decreased strength, hypomobility, increased edema, increased fascial restrictions, impaired perceived functional ability, impaired flexibility, and pain.    ACTIVITY LIMITATIONS carrying, lifting, bending, sitting, standing, squatting, sleeping, stairs, transfers, bed mobility, bathing, toileting, and dressing  PARTICIPATION LIMITATIONS: meal prep, cleaning, laundry, medication management, personal finances, driving, shopping, community activity, yard work, school, and church  PERSONAL FACTORS Age, Fitness, and Time since onset of injury/illness/exacerbation are also affecting patient's functional outcome.   REHAB POTENTIAL: Good  CLINICAL DECISION MAKING: Stable/uncomplicated  EVALUATION COMPLEXITY: Low   GOALS: Goals reviewed with patient? Yes  SHORT TERM GOALS: Target date: 01/04/2022    patient will be independent with initial HEP Baseline: Goal status: MET  2.   Patient will improve Right knee ROM to -8 to 90 to improve functional mobility in home  Baseline:   Eval 12/23/21  Knee flexion 58 88  Knee extension -10 -5   Goal status: IN PROGRESS  LONG TERM GOALS: Target date: 01/08/2022    Patient will be independent with advanced HEP and self management strategies to improve quality of life and functional outcomes.  Baseline:  Goal status: IN PROGRESS  2.   Patient with improved Right knee ROM to -5 to 120  to be able to safely navigate stairs   Baseline:  Knee flexion 58  Knee extension -10   Goal status: IN PROGRESS  3.  Patient will increase Right knee MMTs to 5/5 to promote return to ambulation community distances with minimal deviation.   Baseline:  Goal status: IN PROGRESS  4.   Patient will improve FOTO score to predicted value to demonstrate improved functional mobility.  Baseline: 40 Goal status: IN PROGRESS PLAN: PT FREQUENCY: 2x/week  PT DURATION: 4 weeks  PLANNED INTERVENTIONS: Therapeutic exercises, Therapeutic activity, Neuromuscular re-education, Balance training, Gait training, Patient/Family education, Joint manipulation, Joint mobilization, Stair training, Vestibular training, Canalith  repositioning, Orthotic/Fit training, DME instructions, Aquatic Therapy, Dry Needling, Electrical stimulation, Spinal manipulation, Spinal mobilization, Cryotherapy, Moist heat, scar mobilization, Taping, Traction, Ultrasound, Biofeedback, Manual therapy, and Re-evaluation  PLAN FOR NEXT SESSION:  progress knee mobility and strengthening as able.  Ihor Austin, LPTA/CLT; CBIS [phone]  2:01 PM, 12/31/21

## 2022-01-05 ENCOUNTER — Ambulatory Visit (HOSPITAL_COMMUNITY): Payer: Medicare PPO | Admitting: Physical Therapy

## 2022-01-05 ENCOUNTER — Encounter (HOSPITAL_COMMUNITY): Payer: Self-pay | Admitting: Physical Therapy

## 2022-01-05 DIAGNOSIS — M1711 Unilateral primary osteoarthritis, right knee: Secondary | ICD-10-CM

## 2022-01-05 DIAGNOSIS — Z96651 Presence of right artificial knee joint: Secondary | ICD-10-CM

## 2022-01-05 DIAGNOSIS — R262 Difficulty in walking, not elsewhere classified: Secondary | ICD-10-CM | POA: Diagnosis not present

## 2022-01-05 NOTE — Therapy (Signed)
OUTPATIENT PHYSICAL THERAPY Treatment Note   Patient Name: Shawna Lewis MRN: [id number] DOB:1950-10-12, 71 y.o., female Today's Date: 01/05/2022  Progress Note   Reporting Period 12/11/21 to 01/08/22   See note below for Objective Data and Assessment of Progress/Goals    PT End of Session - 01/05/22 1133     Visit Number 8    Number of Visits 16    Date for PT Re-Evaluation 02/02/22    Authorization Type [employer] Choice;  auth required; no VL    Authorization Time Period 8 visits approved 12/11/21 - 01/08/22; 8 visits requested - check auth    Progress Note Due on Visit 18    PT Start Time 1133    PT Stop Time 1211    PT Time Calculation (min) 38 min    Activity Tolerance Patient tolerated treatment well    Behavior During Therapy WFL for tasks assessed/performed                Past Medical History:  Diagnosis Date   Anemia    Arthritis    DDD (degenerative disc disease), lumbar    Fibromyalgia    GERD (gastroesophageal reflux disease)    Heart murmur    Hypertension    Migraine    OCD (obsessive compulsive disorder)    Osteomyelitis ([place])    Past Surgical History:  Procedure Laterality Date   ANKLE FRACTURE SURGERY  2018   BALLOON DILATION  05/23/2021   Procedure: BALLOON DILATION;  Surgeon: Harvel Quale, MD;  [facility];  Service: Gastroenterology;;  ascending colon strictures   BIOPSY  05/23/2021   Procedure: BIOPSY;  Surgeon: Harvel Quale, MD;  [facility];  Service: Gastroenterology;;   BREAST BIOPSY  1995   CATARACT EXTRACTION     [place]   COLONOSCOPY WITH PROPOFOL N/A 05/23/2021   Procedure: COLONOSCOPY WITH PROPOFOL;  Surgeon: Harvel Quale, MD;  [facility];  Service: Gastroenterology;  Laterality: N/A;  10:10   ESOPHAGOGASTRODUODENOSCOPY (EGD) WITH PROPOFOL N/A 05/23/2021   Procedure: ESOPHAGOGASTRODUODENOSCOPY (EGD) WITH PROPOFOL;  Surgeon:  Harvel Quale, MD;  [facility];  Service: Gastroenterology;  Laterality: N/A;   IR LUMBAR DISC ASPIRATION W/IMG GUIDE  06/22/2019   KNEE ARTHROSCOPY W/ MENISCAL REPAIR     RADIOFREQUENCY ABLATION NERVES     TONSILLECTOMY  1974   TOTAL KNEE ARTHROPLASTY Right 12/09/2021   Procedure: TOTAL KNEE ARTHROPLASTY;  Surgeon: Renette Butters, MD;  [facility];  Service: Orthopedics;  Laterality: Right;   Patient Active Problem List   Diagnosis Date Noted   S/P total knee arthroplasty, right 12/09/2021   Positive FIT (fecal immunochemical test) 05/14/2021   Anemia 05/14/2021   Therapeutic drug monitoring 06/30/2019   Chronic left lumbar radiculopathy 06/21/2019   Diskitis 06/20/2019   Osteomyelitis ([place]) 06/20/2019   HTN (hypertension) 06/20/2019   DDD (degenerative disc disease), lumbar    Migraine    Chronic low back pain    Central stenosis of spinal canal     PCP: Allyn Kenner, MD  REFERRING PROVIDER: Edmonia Lynch, MD  REFERRING DIAG: PT eval/tx for post op rt TKR (DOS 12/09/21) schedule for 12/11/21 or 12/12/21 per Edmonia Lynch, MD; Next apt 01/07/22.  OASTEOARTHRITIS RIGHT KNEE   THERAPY DIAG:  Primary osteoarthritis of right knee  Difficulty in walking, not elsewhere classified  Total knee replacement status, right  Rationale for Evaluation and Treatment Rehabilitation  ONSET DATE:  12/09/2021  SUBJECTIVE:   SUBJECTIVE STATEMENT: She spent the day on an ice pack yesterday because her sciatic nerve and back bothering her. Feels pretty good with R knee. Patient states 80% improvement since beginning therapy. She remains limited by swelling which limits motion. Continued L knee pain and low back pain. Has been doing HEP. Patient was using cane before R TKA. She tended to use walls for support also. Was using rollator/walls/nothing for support.   PERTINENT HISTORY: Left knee needs TKA; 15 years ago arthroscopic surgery Left knee. Back issues;   [place] and fractured Right ankle 2019   PAIN:  Are you having pain? Yes: NPRS scale: 3/10 Pain location: Right knee Pain description: stinging, sore, aching Aggravating factors: movement Relieving factors: ice, pain meds  PRECAUTIONS: Fall  WEIGHT BEARING RESTRICTIONS No WBAT  FALLS:  Has patient fallen in last 6 months? No    OCCUPATION: retired  PLOF: Independent  PATIENT GOALS get my knee working well.    OBJECTIVE:   DIAGNOSTIC FINDINGS: CLINICAL DATA:  Status post right knee arthroplasty   EXAM: PORTABLE RIGHT KNEE - 1-2 VIEW   COMPARISON:  None Available.   FINDINGS: Status post right knee total arthroplasty with expected overlying postoperative change. No evidence of perihardware fracture or component malpositioning.   IMPRESSION: Status post right knee total arthroplasty with expected overlying postoperative change. No evidence of perihardware fracture or component malpositioning.    PATIENT SURVEYS:  FOTO 40  6/19 48% function  COGNITION:  Overall cognitive status: Within functional limits for tasks assessed     SENSATION: Neuropathy in feet  POSTURE: flexed trunk   PALPATION: General soreness normal for this time s/p Right TKA  LOWER EXTREMITY ROM:  Active ROM Right eval Left eval Right  12/16/21 Right 12/23/21 Right  12/30/21 Right 12/31/21 Right 01/05/22  Hip flexion         Hip extension         Hip abduction         Hip adduction         Hip internal rotation         Hip external rotation         Knee flexion 58  86 88 90 94 95  Knee extension -10  Lacking 5 Lacking 5 degrees Lacking 5 Lacking 4 degrees Lacking 3  Ankle dorsiflexion         Ankle plantarflexion         Ankle inversion         Ankle eversion          (Blank rows = not tested)  LOWER EXTREMITY MMT:  MMT Right eval Left eval 6/19 Right 01/05/22 Left  Hip flexion   4+/5 4+/5  Hip extension      Hip abduction      Hip adduction      Hip internal rotation       Hip external rotation      Knee flexion   4+/5 4+/5  Knee extension Fair quad set  4+/5 4+/5  Ankle dorsiflexion   5/5 5/5  Ankle plantarflexion      Ankle inversion      Ankle eversion       (Blank rows = not tested)    GAIT: Distance walked: 50 Assistive device utilized: Environmental consultant - 2 wheeled Level of assistance: SBA Comments: wearing leg immobilizer Right knee; antalgic gait; step to gait    TODAY'S TREATMENT: 01/05/22 Rocking on bike 5 minutes ,  able to make full revolution backward and forward toward end Reassessment   12/31/21: Rocking on bike 5, able to make full revolution backward, Lt knee blocks ability to go forward.    Standing: Rockerboard A/P and lateral - 1 minute x 2 each direction  Heel raises on incline slope 15x  Toe raises on incline slope 15x Knee drive on 14NW step for flexion 10" holds 10x  Hamstring stretch 3x 30" holds Prone: quad stretch with rope 2x 30"  Contract relax for flexion 3x 10" holds  Manual in supine with LE elevated: retrograde massage, patella mobs all directions  Skin integrity, decreased redness and heat, dry skin distally    12/30/21 Rocking on bike with 5" holds end range for mobility x 5 min, seat 13 Step up 4 inch 2x 10  Lateral step up 4 inch 2x 10  STS 2x 10  Rockerboard A/P and lateral - 1 minute x 2 each direction Hamstring curl isometric in seated with green ball 10 x 10 second holds   12/25/21 Rocking on bike with 5" holds end range for mobility x 5 min, seat 13 Gait with SPC 111f, good sequence, cueing for heel to toe mechanics and equal stride length.  Encouraged to begin inside home, if pain tolerable Knee drive on 8in step for flexion 10x 10" holds stretch only not painful TKE 20x 5" with RTB Hamstring stretch on 12in step 3x 30" Rockerboard 1' each DF/PF and lateral Tandem stance x 30" each   Manual: Decongestive techqniues for edema control with LE elevated   Traced redness  Supine: 10 heel  slides   AROM 5-88 degrees    PATIENT EDUCATION:  Education details: HEP 6/19 reassessment, POC, HEP Person educated: Patient Education method: Explanation, Demonstration, and Handouts Education comprehension: verbalized understanding, returned demonstration, and needs further education   HOME EXERCISE PROGRAM: Access Code: [email or web address]: [email or web address] Date: 12/11/2021 Prepared by: [facility]  Exercises - Supine Ankle Pumps  - 2-3 x daily - 7 x weekly - 2 sets - 10 reps - Long Sitting Quad Set  - 2-3 x daily - 7 x weekly - 2 sets - 10 reps - Supine Hip Abduction AROM  - 2-3 x daily - 7 x weekly - 3 sets - 10 reps - Supine Heel Slide  - 2-3 x daily - 7 x weekly - 3 sets - 10 reps - Seated Knee Flexion Extension AROM   - 2-3 x daily - 7 x weekly - 3 sets - 10 reps - Seated Heel Toe Raises  - 2-3 x daily - 7 x weekly - 3 sets - 10 reps 12/16/21 - Seated Long Arc Quad  - 2 x daily - 7 x weekly - 2 sets - 10 reps - 5 second hold - Long Sitting Hamstring Set  - 2 x daily - 7 x weekly - 2 sets - 10 reps - 5 second holds hold  12/23/21:  Seated AAROM knee flexion 6/13 STS  ASSESSMENT:  CLINICAL IMPRESSION: Patient has met 2/2 short term goals and 0/4 long term goals with ability to complete HEP and improvement in ROM. Remaining goals not met due to continued deficits in strength, ROM, activity tolerance, and functional mobility. Patient has made good progress since starting PT with strength and ROM deficits. Patient also limited by LBP and L knee pain. Discussed reassessment findings with patient and educated on probable improvements in mobility if POC extended. Extending POC 2x/week for 4 weeks to continue to  improve ROM and strength to improve functional mobility and gait. Patient will continue to benefit from physical therapy in order to improve function and reduce impairment.    OBJECTIVE IMPAIRMENTS Abnormal gait, decreased activity tolerance, decreased balance,  decreased endurance, decreased mobility, difficulty walking, decreased ROM, decreased strength, hypomobility, increased edema, increased fascial restrictions, impaired perceived functional ability, impaired flexibility, and pain.   ACTIVITY LIMITATIONS carrying, lifting, bending, sitting, standing, squatting, sleeping, stairs, transfers, bed mobility, bathing, toileting, and dressing  PARTICIPATION LIMITATIONS: meal prep, cleaning, laundry, medication management, personal finances, driving, shopping, community activity, yard work, school, and church  PERSONAL FACTORS Age, Fitness, and Time since onset of injury/illness/exacerbation are also affecting patient's functional outcome.   REHAB POTENTIAL: Good  CLINICAL DECISION MAKING: Stable/uncomplicated  EVALUATION COMPLEXITY: Low   GOALS: Goals reviewed with patient? Yes  SHORT TERM GOALS: Target date: 01/04/2022    patient will be independent with initial HEP Baseline: Goal status: MET  2.   Patient will improve Right knee ROM to -8 to 90 to improve functional mobility in home  Baseline:   Eval 12/23/21 01/05/22  Knee flexion 58 88 95  Knee extension -10 -5 Lacking 3   Goal status: MET  LONG TERM GOALS: Target date: 01/08/2022    Patient will be independent with advanced HEP and self management strategies to improve quality of life and functional outcomes.  Baseline:  Goal status: IN PROGRESS  2.   Patient with improved Right knee ROM to -5 to 120  to be able to safely navigate stairs   Baseline:  Knee flexion 58 01/05/22 95   Knee extension -10 Lacking 3   Goal status: IN PROGRESS  3.  Patient will increase Right knee MMTs to 5/5 to promote return to ambulation community distances with minimal deviation.   Baseline:  see MMT Goal status: IN PROGRESS  4.   Patient will improve FOTO score to predicted value to demonstrate improved functional mobility.  Baseline: 40 6/19 48% function Goal status: IN  PROGRESS PLAN: PT FREQUENCY: 2x/week  PT DURATION: 4 weeks  PLANNED INTERVENTIONS: Therapeutic exercises, Therapeutic activity, Neuromuscular re-education, Balance training, Gait training, Patient/Family education, Joint manipulation, Joint mobilization, Stair training, Vestibular training, Canalith repositioning, Orthotic/Fit training, DME instructions, Aquatic Therapy, Dry Needling, Electrical stimulation, Spinal manipulation, Spinal mobilization, Cryotherapy, Moist heat, scar mobilization, Taping, Traction, Ultrasound, Biofeedback, Manual therapy, and Re-evaluation  PLAN FOR NEXT SESSION:  progress knee mobility and strengthening as able.  12:24 PM, 01/05/22 Mearl Latin PT, DPT Physical Therapist at [facility]

## 2022-01-07 ENCOUNTER — Encounter (HOSPITAL_COMMUNITY): Payer: Medicare PPO

## 2022-01-08 ENCOUNTER — Ambulatory Visit (HOSPITAL_COMMUNITY): Payer: Medicare PPO

## 2022-01-15 ENCOUNTER — Ambulatory Visit (HOSPITAL_COMMUNITY): Payer: Medicare PPO

## 2022-01-15 ENCOUNTER — Encounter (HOSPITAL_COMMUNITY): Payer: Self-pay

## 2022-01-15 DIAGNOSIS — M1711 Unilateral primary osteoarthritis, right knee: Secondary | ICD-10-CM | POA: Diagnosis not present

## 2022-01-15 DIAGNOSIS — Z96651 Presence of right artificial knee joint: Secondary | ICD-10-CM | POA: Diagnosis not present

## 2022-01-15 DIAGNOSIS — R262 Difficulty in walking, not elsewhere classified: Secondary | ICD-10-CM | POA: Diagnosis not present

## 2022-01-15 NOTE — Therapy (Signed)
OUTPATIENT PHYSICAL THERAPY Treatment Note   Patient Name: NAJAT OLAZABAL MRN: [id number] DOB:16-Aug-1950, 71 y.o., female Today's Date: 01/15/2022           Past Medical History:  Diagnosis Date   Anemia    Arthritis    DDD (degenerative disc disease), lumbar    Fibromyalgia    GERD (gastroesophageal reflux disease)    Heart murmur    Hypertension    Migraine    OCD (obsessive compulsive disorder)    Osteomyelitis ([place])    Past Surgical History:  Procedure Laterality Date   ANKLE FRACTURE SURGERY  2018   BALLOON DILATION  05/23/2021   Procedure: BALLOON DILATION;  Surgeon: Harvel Quale, MD;  Location: [facility];  Service: Gastroenterology;;  ascending colon strictures   BIOPSY  05/23/2021   Procedure: BIOPSY;  Surgeon: Harvel Quale, MD;  Location: [facility];  Service: Gastroenterology;;   BREAST BIOPSY  1995   CATARACT EXTRACTION     [place]   COLONOSCOPY WITH PROPOFOL N/A 05/23/2021   Procedure: COLONOSCOPY WITH PROPOFOL;  Surgeon: Harvel Quale, MD;  Location: [facility];  Service: Gastroenterology;  Laterality: N/A;  10:10   ESOPHAGOGASTRODUODENOSCOPY (EGD) WITH PROPOFOL N/A 05/23/2021   Procedure: ESOPHAGOGASTRODUODENOSCOPY (EGD) WITH PROPOFOL;  Surgeon: Harvel Quale, MD;  Location: [facility];  Service: Gastroenterology;  Laterality: N/A;   IR LUMBAR DISC ASPIRATION W/IMG GUIDE  06/22/2019   KNEE ARTHROSCOPY W/ MENISCAL REPAIR     RADIOFREQUENCY ABLATION NERVES     TONSILLECTOMY  1974   TOTAL KNEE ARTHROPLASTY Right 12/09/2021   Procedure: TOTAL KNEE ARTHROPLASTY;  Surgeon: Renette Butters, MD;  Location: [facility];  Service: Orthopedics;  Laterality: Right;   Patient Active Problem List   Diagnosis Date Noted   S/P total knee arthroplasty, right 12/09/2021   Positive FIT (fecal immunochemical test) 05/14/2021   Anemia 05/14/2021   Therapeutic drug monitoring 06/30/2019    Chronic left lumbar radiculopathy 06/21/2019   Diskitis 06/20/2019   Osteomyelitis ([place]) 06/20/2019   HTN (hypertension) 06/20/2019   DDD (degenerative disc disease), lumbar    Migraine    Chronic low back pain    Central stenosis of spinal canal     PCP: Allyn Kenner, MD  REFERRING PROVIDER: Edmonia Lynch, MD  REFERRING DIAG: PT eval/tx for post op rt TKR (DOS 12/09/21) schedule for 12/11/21 or 12/12/21 per Edmonia Lynch, MD; Next apt 01/07/22.  OASTEOARTHRITIS RIGHT KNEE   THERAPY DIAG:  No diagnosis found.  Rationale for Evaluation and Treatment Rehabilitation  ONSET DATE: 12/09/2021  SUBJECTIVE:   SUBJECTIVE STATEMENT: Saw MD 01/09/22 and happy with progress.  Reports plans for Lt knee TKA on 04/04/22.  Arrived with [facility]  She spent the day on an ice pack yesterday because her sciatic nerve and back bothering her. Feels pretty good with R knee. Patient states 80% improvement since beginning therapy. She remains limited by swelling which limits motion. Continued L knee pain and low back pain. Has been doing HEP. Patient was using cane before R TKA. She tended to use walls for support also. Was using rollator/walls/nothing for support.   PERTINENT HISTORY: Left knee needs TKA; 15 years ago arthroscopic surgery Left knee. Back issues;  [place] and fractured Right ankle 2019   PAIN:  Are you having pain? Yes: NPRS scale: 3/10 Pain location: Right knee Pain description: stinging, sore, aching Aggravating factors: movement Relieving factors: ice, pain meds  PRECAUTIONS: Fall  WEIGHT  BEARING RESTRICTIONS No WBAT  FALLS:  Has patient fallen in last 6 months? No    OCCUPATION: retired  PLOF: Independent  PATIENT GOALS get my knee working well.    OBJECTIVE:   DIAGNOSTIC FINDINGS: CLINICAL DATA:  Status post right knee arthroplasty   EXAM: PORTABLE RIGHT KNEE - 1-2 VIEW   COMPARISON:  None Available.   FINDINGS: Status post right knee total arthroplasty with  expected overlying postoperative change. No evidence of perihardware fracture or component malpositioning.   IMPRESSION: Status post right knee total arthroplasty with expected overlying postoperative change. No evidence of perihardware fracture or component malpositioning.    PATIENT SURVEYS:  FOTO 40  6/19 48% function  COGNITION:  Overall cognitive status: Within functional limits for tasks assessed     SENSATION: Neuropathy in feet  POSTURE: flexed trunk   PALPATION: General soreness normal for this time s/p Right TKA  LOWER EXTREMITY ROM:  Active ROM Right eval Left eval Right  12/16/21 Right 12/23/21 Right  12/30/21 Right 12/31/21 Right 01/05/22 Right 01/15/22  Hip flexion          Hip extension          Hip abduction          Hip adduction          Hip internal rotation          Hip external rotation          Knee flexion 58  86 88 90 94 95 AROM 98; AAROM 99  Knee extension -10  Lacking 5 Lacking 5 degrees Lacking 5 Lacking 4 degrees Lacking 3 Lacking 3  Ankle dorsiflexion          Ankle plantarflexion          Ankle inversion          Ankle eversion           (Blank rows = not tested)  LOWER EXTREMITY MMT:  MMT Right eval Left eval 6/19 Right 01/05/22 Left  Hip flexion   4+/5 4+/5  Hip extension      Hip abduction      Hip adduction      Hip internal rotation      Hip external rotation      Knee flexion   4+/5 4+/5  Knee extension Fair quad set  4+/5 4+/5  Ankle dorsiflexion   5/5 5/5  Ankle plantarflexion      Ankle inversion      Ankle eversion       (Blank rows = not tested)    GAIT: Distance walked: 50 Assistive device utilized: Environmental consultant - 2 wheeled Level of assistance: SBA Comments: wearing leg immobilizer Right knee; antalgic gait; step to gait    TODAY'S TREATMENT: 01/15/22: Full revolution, seat 15 for Lt knee mobility/ pain control with full forward revolution 5 Standing: Knee drive on 29JJ step for flexion 10" holds  10x  Hamstring stretch 3x 30" holds  TKE body craft 1Pl 10x 5" holds  Seated STS standard height, feet equal 10x no HHA  Prone: quad stretch with rope 2x 30"  Contract relax for flexion 3x 10" holds Supine: heel slide 10x AROM 3-99 degrees  01/05/22 Rocking on bike 5 minutes , able to make full revolution backward and forward toward end Reassessment   12/31/21: Rocking on bike 5, able to make full revolution backward, Lt knee blocks ability to go forward.    Standing: Rockerboard A/P and lateral - 1 minute  x 2 each direction  Heel raises on incline slope 15x  Toe raises on incline slope 15x Knee drive on 44RX step for flexion 10" holds 10x  Hamstring stretch 3x 30" holds Prone: quad stretch with rope 2x 30"  Contract relax for flexion 3x 10" holds  Manual in supine with LE elevated: retrograde massage, patella mobs all directions  Skin integrity, decreased redness and heat, dry skin distally    12/30/21 Rocking on bike with 5" holds end range for mobility x 5 min, seat 13 Step up 4 inch 2x 10  Lateral step up 4 inch 2x 10  STS 2x 10  Rockerboard A/P and lateral - 1 minute x 2 each direction Hamstring curl isometric in seated with green ball 10 x 10 second holds   12/25/21 Rocking on bike with 5" holds end range for mobility x 5 min, seat 13 Gait with SPC 136f, good sequence, cueing for heel to toe mechanics and equal stride length.  Encouraged to begin inside home, if pain tolerable Knee drive on 8in step for flexion 10x 10" holds stretch only not painful TKE 20x 5" with RTB Hamstring stretch on 12in step 3x 30" Rockerboard 1' each DF/PF and lateral Tandem stance x 30" each   Manual: Decongestive techqniues for edema control with LE elevated   Traced redness  Supine: 10 heel slides   AROM 5-88 degrees    PATIENT EDUCATION:  Education details: HEP 6/19 reassessment, POC, HEP Person educated: Patient Education method: Explanation, Demonstration, and  Handouts Education comprehension: verbalized understanding, returned demonstration, and needs further education   HOME EXERCISE PROGRAM: Access Code: [email or web address]: [email or web address] Date: 12/11/2021 Prepared by: [facility]  Exercises - Supine Ankle Pumps  - 2-3 x daily - 7 x weekly - 2 sets - 10 reps - Long Sitting Quad Set  - 2-3 x daily - 7 x weekly - 2 sets - 10 reps - Supine Hip Abduction AROM  - 2-3 x daily - 7 x weekly - 3 sets - 10 reps - Supine Heel Slide  - 2-3 x daily - 7 x weekly - 3 sets - 10 reps - Seated Knee Flexion Extension AROM   - 2-3 x daily - 7 x weekly - 3 sets - 10 reps - Seated Heel Toe Raises  - 2-3 x daily - 7 x weekly - 3 sets - 10 reps 12/16/21 - Seated Long Arc Quad  - 2 x daily - 7 x weekly - 2 sets - 10 reps - 5 second hold - Long Sitting Hamstring Set  - 2 x daily - 7 x weekly - 2 sets - 10 reps - 5 second holds hold  12/23/21:  Seated AAROM knee flexion 6/13 STS  ASSESSMENT:  CLINICAL IMPRESSION: Session focus with knee mobility and functional strengthening.  Pt making progrss noted with full revolution on bike and ability to complete STS without hands with increased ease.  Added TKE exercise on bodycraft for quad strengthening.  Continued contract/relax and prone quad stretch to improve knee flexion.  Pt presents with some reduction in edema present proximal knee.  AROM at 3-99 degrees.    OBJECTIVE IMPAIRMENTS Abnormal gait, decreased activity tolerance, decreased balance, decreased endurance, decreased mobility, difficulty walking, decreased ROM, decreased strength, hypomobility, increased edema, increased fascial restrictions, impaired perceived functional ability, impaired flexibility, and pain.   ACTIVITY LIMITATIONS carrying, lifting, bending, sitting, standing, squatting, sleeping, stairs, transfers, bed mobility, bathing, toileting, and dressing  PARTICIPATION LIMITATIONS: meal prep, cleaning,  laundry, medication management,  personal finances, driving, shopping, community activity, yard work, school, and church  PERSONAL FACTORS Age, Fitness, and Time since onset of injury/illness/exacerbation are also affecting patient's functional outcome.   REHAB POTENTIAL: Good  CLINICAL DECISION MAKING: Stable/uncomplicated  EVALUATION COMPLEXITY: Low   GOALS: Goals reviewed with patient? Yes  SHORT TERM GOALS: Target date: 01/04/2022    patient will be independent with initial HEP Baseline: Goal status: MET  2.   Patient will improve Right knee ROM to -8 to 90 to improve functional mobility in home  Baseline:   Eval 12/23/21 01/05/22  Knee flexion 58 88 95  Knee extension -10 -5 Lacking 3   Goal status: MET  LONG TERM GOALS: Target date: 01/08/2022    Patient will be independent with advanced HEP and self management strategies to improve quality of life and functional outcomes.  Baseline:  Goal status: IN PROGRESS  2.   Patient with improved Right knee ROM to -5 to 120  to be able to safely navigate stairs   Baseline:  Knee flexion 58 01/05/22 95   Knee extension -10 Lacking 3   Goal status: IN PROGRESS  3.  Patient will increase Right knee MMTs to 5/5 to promote return to ambulation community distances with minimal deviation.   Baseline:  see MMT Goal status: IN PROGRESS  4.   Patient will improve FOTO score to predicted value to demonstrate improved functional mobility.  Baseline: 40 6/19 48% function Goal status: IN PROGRESS PLAN: PT FREQUENCY: 2x/week  PT DURATION: 4 weeks  PLANNED INTERVENTIONS: Therapeutic exercises, Therapeutic activity, Neuromuscular re-education, Balance training, Gait training, Patient/Family education, Joint manipulation, Joint mobilization, Stair training, Vestibular training, Canalith repositioning, Orthotic/Fit training, DME instructions, Aquatic Therapy, Dry Needling, Electrical stimulation, Spinal manipulation, Spinal mobilization, Cryotherapy, Moist heat, scar  mobilization, Taping, Traction, Ultrasound, Biofeedback, Manual therapy, and Re-evaluation  PLAN FOR NEXT SESSION:  progress knee mobility and strengthening as able.  Add squats next session.    Ihor Austin, LPTA/CLT; CBIS [phone]  4:29 PM, 01/15/22

## 2022-01-19 DIAGNOSIS — Z6834 Body mass index (BMI) 34.0-34.9, adult: Secondary | ICD-10-CM | POA: Diagnosis not present

## 2022-01-19 DIAGNOSIS — M545 Low back pain, unspecified: Secondary | ICD-10-CM | POA: Diagnosis not present

## 2022-01-19 DIAGNOSIS — Z79899 Other long term (current) drug therapy: Secondary | ICD-10-CM | POA: Diagnosis not present

## 2022-01-21 ENCOUNTER — Ambulatory Visit (HOSPITAL_COMMUNITY): Payer: Medicare PPO | Attending: Orthopedic Surgery

## 2022-01-21 ENCOUNTER — Encounter (HOSPITAL_COMMUNITY): Payer: Self-pay

## 2022-01-21 DIAGNOSIS — M1711 Unilateral primary osteoarthritis, right knee: Secondary | ICD-10-CM | POA: Diagnosis not present

## 2022-01-21 DIAGNOSIS — R262 Difficulty in walking, not elsewhere classified: Secondary | ICD-10-CM | POA: Diagnosis not present

## 2022-01-21 DIAGNOSIS — Z96651 Presence of right artificial knee joint: Secondary | ICD-10-CM | POA: Insufficient documentation

## 2022-01-21 NOTE — Therapy (Signed)
OUTPATIENT PHYSICAL THERAPY Treatment Note   Patient Name: Shawna Lewis MRN: [id number] DOB:1950/12/08, 71 y.o., female Today's Date: 01/21/2022      PT End of Session - 01/21/22 1605     Visit Number 10    Number of Visits 16    Date for PT Re-Evaluation 02/02/22    Authorization Type [employer];  auth required; no VL    Authorization Time Period 8 visits approved 12/11/21 - 01/08/22; 8 visits requested - 6/19--02/02/22    Authorization - Visit Number 2    Authorization - Number of Visits 8    Progress Note Due on Visit 18    PT Start Time 1600    PT Stop Time 1642    PT Time Calculation (min) 42 min    Activity Tolerance Patient tolerated treatment well    Behavior During Therapy WFL for tasks assessed/performed                 Past Medical History:  Diagnosis Date   Anemia    Arthritis    DDD (degenerative disc disease), lumbar    Fibromyalgia    GERD (gastroesophageal reflux disease)    Heart murmur    Hypertension    Migraine    OCD (obsessive compulsive disorder)    Osteomyelitis ([place])    Past Surgical History:  Procedure Laterality Date   ANKLE FRACTURE SURGERY  2018   BALLOON DILATION  05/23/2021   Procedure: BALLOON DILATION;  Surgeon: Harvel Quale, MD;  Location: [facility];  Service: Gastroenterology;;  ascending colon strictures   BIOPSY  05/23/2021   Procedure: BIOPSY;  Surgeon: Harvel Quale, MD;  Location: [facility];  Service: Gastroenterology;;   BREAST BIOPSY  1995   CATARACT EXTRACTION     [place]   COLONOSCOPY WITH PROPOFOL N/A 05/23/2021   Procedure: COLONOSCOPY WITH PROPOFOL;  Surgeon: Harvel Quale, MD;  Location: [facility];  Service: Gastroenterology;  Laterality: N/A;  10:10   ESOPHAGOGASTRODUODENOSCOPY (EGD) WITH PROPOFOL N/A 05/23/2021   Procedure: ESOPHAGOGASTRODUODENOSCOPY (EGD) WITH PROPOFOL;  Surgeon: Harvel Quale, MD;  [facility];  Service: Gastroenterology;  Laterality: N/A;   IR LUMBAR DISC ASPIRATION W/IMG GUIDE  06/22/2019   KNEE ARTHROSCOPY W/ MENISCAL REPAIR     RADIOFREQUENCY ABLATION NERVES     TONSILLECTOMY  1974   TOTAL KNEE ARTHROPLASTY Right 12/09/2021   Procedure: TOTAL KNEE ARTHROPLASTY;  Surgeon: Renette Butters, MD;  Location: [facility];  Service: Orthopedics;  Laterality: Right;   Patient Active Problem List   Diagnosis Date Noted   S/P total knee arthroplasty, right 12/09/2021   Positive FIT (fecal immunochemical test) 05/14/2021   Anemia 05/14/2021   Therapeutic drug monitoring 06/30/2019   Chronic left lumbar radiculopathy 06/21/2019   Diskitis 06/20/2019   Osteomyelitis ([place]) 06/20/2019   HTN (hypertension) 06/20/2019   DDD (degenerative disc disease), lumbar    Migraine    Chronic low back pain    Central stenosis of spinal canal     PCP: Allyn Kenner, MD  REFERRING PROVIDER: Edmonia Lynch, MD  REFERRING DIAG: PT eval/tx for post op rt TKR (DOS 12/09/21) schedule for 12/11/21 or 12/12/21 per Edmonia Lynch, MD; Next apt 01/07/22.  OASTEOARTHRITIS RIGHT KNEE   THERAPY DIAG:  Primary osteoarthritis of right knee  Difficulty in walking, not elsewhere classified  Rationale for Evaluation and Treatment Rehabilitation  ONSET DATE: 12/09/2021  SUBJECTIVE:   SUBJECTIVE STATEMENT: Reports she  has some soreness Bil knees today, arrived ambulating with cane today.  12/11/21: Eval subjective: Patient has had knee trouble for years;  Worse over the last 3-4 years. Using CPM at home.   PERTINENT HISTORY: Left knee needs TKA; 15 years ago arthroscopic surgery Left knee. Back issues;  [place] and fractured Right ankle 2019   PAIN:  Are you having pain? Yes: NPRS scale: 3/10 Pain location: Right knee Pain description: stinging, sore, aching Aggravating factors: movement Relieving factors: ice, pain meds  PRECAUTIONS: Fall  WEIGHT BEARING RESTRICTIONS No WBAT  FALLS:  Has  patient fallen in last 6 months? No    OCCUPATION: retired  PLOF: Independent  PATIENT GOALS get my knee working well.    OBJECTIVE:   DIAGNOSTIC FINDINGS: CLINICAL DATA:  Status post right knee arthroplasty   EXAM: PORTABLE RIGHT KNEE - 1-2 VIEW   COMPARISON:  None Available.   FINDINGS: Status post right knee total arthroplasty with expected overlying postoperative change. No evidence of perihardware fracture or component malpositioning.   IMPRESSION: Status post right knee total arthroplasty with expected overlying postoperative change. No evidence of perihardware fracture or component malpositioning.    PATIENT SURVEYS:  FOTO 40  6/19 48% function  COGNITION:  Overall cognitive status: Within functional limits for tasks assessed     SENSATION: Neuropathy in feet  POSTURE: flexed trunk   PALPATION: General soreness normal for this time s/p Right TKA  LOWER EXTREMITY ROM:  Active ROM Right eval Left eval Right  12/16/21 Right 12/23/21 Right  12/30/21 Right 12/31/21 Right 01/05/22 Right 01/15/22  Hip flexion          Hip extension          Hip abduction          Hip adduction          Hip internal rotation          Hip external rotation          Knee flexion 58  86 88 90 94 95 AROM 98; AAROM 99  Knee extension -10  Lacking 5 Lacking 5 degrees Lacking 5 Lacking 4 degrees Lacking 3 Lacking 3  Ankle dorsiflexion          Ankle plantarflexion          Ankle inversion          Ankle eversion           (Blank rows = not tested)  LOWER EXTREMITY MMT:  MMT Right eval Left eval 6/19 Right 01/05/22 Left  Hip flexion   4+/5 4+/5  Hip extension      Hip abduction      Hip adduction      Hip internal rotation      Hip external rotation      Knee flexion   4+/5 4+/5  Knee extension Fair quad set  4+/5 4+/5  Ankle dorsiflexion   5/5 5/5  Ankle plantarflexion      Ankle inversion      Ankle eversion       (Blank rows = not  tested)    GAIT: Distance walked: 50 Assistive device utilized: Environmental consultant - 2 wheeled Level of assistance: SBA Comments: wearing leg immobilizer Right knee; antalgic gait; step to gait    TODAY'S TREATMENT: 01/21/22:  Full revolution seat 14 for Rt knee mobility x 5 min  Standing:  Squat front of chair, cueing for mechanics TKE body craft 2Rt 15x 5" Knee drive on 59YV  step for flexion 5x 10" holds Hamstring stretch 3x 30" on 12in step Sidestep 2RT RTB around thigh 7in step to pattern with Rt leading ascent Lt leading descending for Lt knee pain control 2HR then 1 HR  STS standard height 10x no HHA  Prone: quad stretch with rope 2x 30"  Contract relax for flexion 3x 10" holds Supine: heel slide 10x AROM 2-100 degrees    01/15/22: Full revolution, seat 15 for Lt knee mobility/ pain control with full forward revolution 5 Standing: Knee drive on 09BD step for flexion 10" holds 10x  Hamstring stretch 3x 30" holds  TKE body craft 1Pl 10x 5" holds  Seated STS standard height, feet equal 10x no HHA  Prone: quad stretch with rope 2x 30"  Contract relax for flexion 3x 10" holds Supine: heel slide 10x AROM 3-99 degrees  01/05/22 Rocking on bike 5 minutes , able to make full revolution backward and forward toward end Reassessment   12/31/21: Rocking on bike 5, able to make full revolution backward, Lt knee blocks ability to go forward.    Standing: Rockerboard A/P and lateral - 1 minute x 2 each direction  Heel raises on incline slope 15x  Toe raises on incline slope 15x Knee drive on 53GD step for flexion 10" holds 10x  Hamstring stretch 3x 30" holds Prone: quad stretch with rope 2x 30"  Contract relax for flexion 3x 10" holds  Manual in supine with LE elevated: retrograde massage, patella mobs all directions  Skin integrity, decreased redness and heat, dry skin distally    12/30/21 Rocking on bike with 5" holds end range for mobility x 5 min, seat 13 Step up 4 inch  2x 10  Lateral step up 4 inch 2x 10  STS 2x 10  Rockerboard A/P and lateral - 1 minute x 2 each direction Hamstring curl isometric in seated with green ball 10 x 10 second holds   12/25/21 Rocking on bike with 5" holds end range for mobility x 5 min, seat 13 Gait with SPC 124f, good sequence, cueing for heel to toe mechanics and equal stride length.  Encouraged to begin inside home, if pain tolerable Knee drive on 8in step for flexion 10x 10" holds stretch only not painful TKE 20x 5" with RTB Hamstring stretch on 12in step 3x 30" Rockerboard 1' each DF/PF and lateral Tandem stance x 30" each   Manual: Decongestive techqniues for edema control with LE elevated   Traced redness  Supine: 10 heel slides   AROM 5-88 degrees    PATIENT EDUCATION:  Education details: HEP 6/19 reassessment, POC, HEP Person educated: Patient Education method: Explanation, Demonstration, and Handouts Education comprehension: verbalized understanding, returned demonstration, and needs further education   HOME EXERCISE PROGRAM: Access Code: [email or web address]: [email or web address] Date: 12/11/2021 Prepared by: [facility]  Exercises - Supine Ankle Pumps  - 2-3 x daily - 7 x weekly - 2 sets - 10 reps - Long Sitting Quad Set  - 2-3 x daily - 7 x weekly - 2 sets - 10 reps - Supine Hip Abduction AROM  - 2-3 x daily - 7 x weekly - 3 sets - 10 reps - Supine Heel Slide  - 2-3 x daily - 7 x weekly - 3 sets - 10 reps - Seated Knee Flexion Extension AROM   - 2-3 x daily - 7 x weekly - 3 sets - 10 reps - Seated Heel Toe Raises  - 2-3 x daily - 7 x weekly -  3 sets - 10 reps 12/16/21 - Seated Long Arc Quad  - 2 x daily - 7 x weekly - 2 sets - 10 reps - 5 second hold - Long Sitting Hamstring Set  - 2 x daily - 7 x weekly - 2 sets - 10 reps - 5 second holds hold  12/23/21:  Seated AAROM knee flexion 6/13 STS  ASSESSMENT:  CLINICAL IMPRESSION: Session focus with knee mobility and functional  strengthening.  Added lunges, squats, sidestep for gluteal strengthening as well as reviewed steps for home.  Pt required cueing for posture and mechanics through session.  Pt present with step to pattern ascending with Rt LE and step down with Lt leading due to Lt knee pain with 1HR use on normal step height.  Continued with contract/relax and quad stretches, noted pt requested stop prior end range with contract/relax.  AROM 2-100 degrees.  Plans for Lt knee TKE in September.  OBJECTIVE IMPAIRMENTS Abnormal gait, decreased activity tolerance, decreased balance, decreased endurance, decreased mobility, difficulty walking, decreased ROM, decreased strength, hypomobility, increased edema, increased fascial restrictions, impaired perceived functional ability, impaired flexibility, and pain.   ACTIVITY LIMITATIONS carrying, lifting, bending, sitting, standing, squatting, sleeping, stairs, transfers, bed mobility, bathing, toileting, and dressing  PARTICIPATION LIMITATIONS: meal prep, cleaning, laundry, medication management, personal finances, driving, shopping, community activity, yard work, school, and church  PERSONAL FACTORS Age, Fitness, and Time since onset of injury/illness/exacerbation are also affecting patient's functional outcome.   REHAB POTENTIAL: Good  CLINICAL DECISION MAKING: Stable/uncomplicated  EVALUATION COMPLEXITY: Low   GOALS: Goals reviewed with patient? Yes  SHORT TERM GOALS: Target date: 01/04/2022    patient will be independent with initial HEP Baseline: Goal status: MET  2.   Patient will improve Right knee ROM to -8 to 90 to improve functional mobility in home  Baseline:   Eval 12/23/21 01/05/22  Knee flexion 58 88 95  Knee extension -10 -5 Lacking 3   Goal status: MET  LONG TERM GOALS: Target date: 01/08/2022    Patient will be independent with advanced HEP and self management strategies to improve quality of life and functional outcomes.  Baseline:  Goal  status: IN PROGRESS  2.   Patient with improved Right knee ROM to -5 to 120  to be able to safely navigate stairs   Baseline:  Knee flexion 58 01/05/22 95   Knee extension -10 Lacking 3   Goal status: IN PROGRESS  3.  Patient will increase Right knee MMTs to 5/5 to promote return to ambulation community distances with minimal deviation.   Baseline:  see MMT Goal status: IN PROGRESS  4.   Patient will improve FOTO score to predicted value to demonstrate improved functional mobility.  Baseline: 40 6/19 48% function Goal status: IN PROGRESS PLAN: PT FREQUENCY: 2x/week  PT DURATION: 4 weeks  PLANNED INTERVENTIONS: Therapeutic exercises, Therapeutic activity, Neuromuscular re-education, Balance training, Gait training, Patient/Family education, Joint manipulation, Joint mobilization, Stair training, Vestibular training, Canalith repositioning, Orthotic/Fit training, DME instructions, Aquatic Therapy, Dry Needling, Electrical stimulation, Spinal manipulation, Spinal mobilization, Cryotherapy, Moist heat, scar mobilization, Taping, Traction, Ultrasound, Biofeedback, Manual therapy, and Re-evaluation  PLAN FOR NEXT SESSION:  progress knee mobility and strengthening as able.   Ihor Austin, LPTA/CLT; CBIS [phone]  5:44 PM, 01/21/22

## 2022-01-27 ENCOUNTER — Encounter (HOSPITAL_COMMUNITY): Payer: Self-pay | Admitting: Physical Therapy

## 2022-01-27 ENCOUNTER — Ambulatory Visit (HOSPITAL_COMMUNITY): Payer: Medicare PPO | Admitting: Physical Therapy

## 2022-01-27 DIAGNOSIS — Z96651 Presence of right artificial knee joint: Secondary | ICD-10-CM

## 2022-01-27 DIAGNOSIS — R262 Difficulty in walking, not elsewhere classified: Secondary | ICD-10-CM

## 2022-01-27 DIAGNOSIS — M1711 Unilateral primary osteoarthritis, right knee: Secondary | ICD-10-CM

## 2022-01-27 NOTE — Therapy (Signed)
OUTPATIENT PHYSICAL THERAPY Treatment Note   Patient Name: Shawna Lewis MRN: [id number] DOB:10-Feb-1951, 71 y.o., female Today's Date: 01/27/2022   PHYSICAL THERAPY DISCHARGE SUMMARY  Visits from Start of Care: 11  Current functional level related to goals / functional outcomes: See below   Remaining deficits: See below   Education / Equipment: See below   Patient agrees to discharge. Patient goals were met. Patient is being discharged due to being pleased with the current functional level.    PT End of Session - 01/27/22 1434     Visit Number 11    Number of Visits 16    Date for PT Re-Evaluation 02/02/22    Authorization Type [employer];  auth required; no VL    Authorization Time Period 8 visits approved 12/11/21 - 01/08/22; 8 visits requested - 6/19--02/02/22    Authorization - Visit Number 3    Authorization - Number of Visits 8    Progress Note Due on Visit 18    PT Start Time 4801    PT Stop Time 1512    PT Time Calculation (min) 38 min    Activity Tolerance Patient tolerated treatment well    Behavior During Therapy WFL for tasks assessed/performed                 Past Medical History:  Diagnosis Date   Anemia    Arthritis    DDD (degenerative disc disease), lumbar    Fibromyalgia    GERD (gastroesophageal reflux disease)    Heart murmur    Hypertension    Migraine    OCD (obsessive compulsive disorder)    Osteomyelitis ([place])    Past Surgical History:  Procedure Laterality Date   ANKLE FRACTURE SURGERY  2018   BALLOON DILATION  05/23/2021   Procedure: BALLOON DILATION;  Surgeon: Harvel Quale, MD;  [facility];  Service: Gastroenterology;;  ascending colon strictures   BIOPSY  05/23/2021   Procedure: BIOPSY;  Surgeon: Harvel Quale, MD;  [facility];  Service: Gastroenterology;;   BREAST BIOPSY  1995   CATARACT EXTRACTION     [place]   COLONOSCOPY WITH PROPOFOL  N/A 05/23/2021   Procedure: COLONOSCOPY WITH PROPOFOL;  Surgeon: Harvel Quale, MD;  [facility];  Service: Gastroenterology;  Laterality: N/A;  10:10   ESOPHAGOGASTRODUODENOSCOPY (EGD) WITH PROPOFOL N/A 05/23/2021   Procedure: ESOPHAGOGASTRODUODENOSCOPY (EGD) WITH PROPOFOL;  Surgeon: Harvel Quale, MD;  [facility];  Service: Gastroenterology;  Laterality: N/A;   IR LUMBAR DISC ASPIRATION W/IMG GUIDE  06/22/2019   KNEE ARTHROSCOPY W/ MENISCAL REPAIR     RADIOFREQUENCY ABLATION NERVES     TONSILLECTOMY  1974   TOTAL KNEE ARTHROPLASTY Right 12/09/2021   Procedure: TOTAL KNEE ARTHROPLASTY;  Surgeon: Renette Butters, MD;  Location: [facility];  Service: Orthopedics;  Laterality: Right;   Patient Active Problem List   Diagnosis Date Noted   S/P total knee arthroplasty, right 12/09/2021   Positive FIT (fecal immunochemical test) 05/14/2021   Anemia 05/14/2021   Therapeutic drug monitoring 06/30/2019   Chronic left lumbar radiculopathy 06/21/2019   Diskitis 06/20/2019   Osteomyelitis ([place]) 06/20/2019   HTN (hypertension) 06/20/2019   DDD (degenerative disc disease), lumbar    Migraine    Chronic low back pain    Central stenosis of spinal canal     PCP: Allyn Kenner, MD  REFERRING PROVIDER: Edmonia Lynch, MD  REFERRING DIAG: PT eval/tx  for post op rt TKR (DOS 12/09/21) schedule for 12/11/21 or 12/12/21 per Edmonia Lynch, MD; Next apt 01/07/22.  OASTEOARTHRITIS RIGHT KNEE   THERAPY DIAG:  Primary osteoarthritis of right knee  Difficulty in walking, not elsewhere classified  Total knee replacement status, right  Rationale for Evaluation and Treatment Rehabilitation  ONSET DATE: 12/09/2021  SUBJECTIVE:   SUBJECTIVE STATEMENT: Some soreness following exercises. She can do everything she wants to do. Patient states 90% improvement with PT intervention and she is better than she throughout.   PERTINENT HISTORY: Left knee needs TKA; 15  years ago arthroscopic surgery Left knee. Back issues;  [place] and fractured Right ankle 2019   PAIN:  Are you having pain? Yes: NPRS scale: 3/10 Pain location: Right knee Pain description: stinging, sore, aching Aggravating factors: movement Relieving factors: ice, pain meds  PRECAUTIONS: Fall  WEIGHT BEARING RESTRICTIONS No WBAT  FALLS:  Has patient fallen in last 6 months? No    OCCUPATION: retired  PLOF: Independent  PATIENT GOALS get my knee working well.    OBJECTIVE:   DIAGNOSTIC FINDINGS: CLINICAL DATA:  Status post right knee arthroplasty   EXAM: PORTABLE RIGHT KNEE - 1-2 VIEW   COMPARISON:  None Available.   FINDINGS: Status post right knee total arthroplasty with expected overlying postoperative change. No evidence of perihardware fracture or component malpositioning.   IMPRESSION: Status post right knee total arthroplasty with expected overlying postoperative change. No evidence of perihardware fracture or component malpositioning.    PATIENT SURVEYS:  FOTO 40  6/19 48% function 7/11 65% function  COGNITION:  Overall cognitive status: Within functional limits for tasks assessed     SENSATION: Neuropathy in feet  POSTURE: flexed trunk   PALPATION: General soreness normal for this time s/p Right TKA  LOWER EXTREMITY ROM:  Active ROM Right eval Left eval Right  12/16/21 Right 12/23/21 Right  12/30/21 Right 12/31/21 Right 01/05/22 Right 01/15/22 Right  01/27/22  Hip flexion           Hip extension           Hip abduction           Hip adduction           Hip internal rotation           Hip external rotation           Knee flexion 58  86 88 90 94 95 AROM 98; AAROM 99 100  Knee extension -10  Lacking 5 Lacking 5 degrees Lacking 5 Lacking 4 degrees Lacking 3 Lacking 3 Lacking 2  Ankle dorsiflexion           Ankle plantarflexion           Ankle inversion           Ankle eversion            (Blank rows = not tested)  LOWER EXTREMITY  MMT:  MMT Right eval Left eval 6/19 Right 01/05/22 Left Right 01/27/22 Left 01/27/22  Hip flexion   4+/5 4+/5 5 4+  Hip extension        Hip abduction        Hip adduction        Hip internal rotation        Hip external rotation        Knee flexion   4+/5 4+/5 5 4+  Knee extension Fair quad set  4+/5 4+/5 5 4+  Ankle dorsiflexion   5/5 5/5  5 5  Ankle plantarflexion        Ankle inversion        Ankle eversion         (Blank rows = not tested)    GAIT: Distance walked: 50 Assistive device utilized: Walker - 2 wheeled Level of assistance: SBA Comments: wearing leg immobilizer Right knee; antalgic gait; step to gait    TODAY'S TREATMENT: 01/27/22 Full revolution seat 14 for Rt knee mobility x 5 min Reassessment  01/21/22:  Full revolution seat 14 for Rt knee mobility x 5 min  Standing:  Squat front of chair, cueing for mechanics TKE body craft 2Rt 15x 5" Knee drive on 44IH step for flexion 5x 10" holds Hamstring stretch 3x 30" on 12in step Sidestep 2RT RTB around thigh 7in step to pattern with Rt leading ascent Lt leading descending for Lt knee pain control 2HR then 1 HR  STS standard height 10x no HHA  Prone: quad stretch with rope 2x 30"  Contract relax for flexion 3x 10" holds Supine: heel slide 10x AROM 2-100 degrees    01/15/22: Full revolution, seat 15 for Lt knee mobility/ pain control with full forward revolution 5 Standing: Knee drive on 47QQ step for flexion 10" holds 10x  Hamstring stretch 3x 30" holds  TKE body craft 1Pl 10x 5" holds  Seated STS standard height, feet equal 10x no HHA  Prone: quad stretch with rope 2x 30"  Contract relax for flexion 3x 10" holds Supine: heel slide 10x AROM 3-99 degrees  01/05/22 Rocking on bike 5 minutes , able to make full revolution backward and forward toward end Reassessment   12/31/21: Rocking on bike 5, able to make full revolution backward, Lt knee blocks ability to go forward.    Standing:  Rockerboard A/P and lateral - 1 minute x 2 each direction  Heel raises on incline slope 15x  Toe raises on incline slope 15x Knee drive on 59DG step for flexion 10" holds 10x  Hamstring stretch 3x 30" holds Prone: quad stretch with rope 2x 30"  Contract relax for flexion 3x 10" holds  Manual in supine with LE elevated: retrograde massage, patella mobs all directions  Skin integrity, decreased redness and heat, dry skin distally    12/30/21 Rocking on bike with 5" holds end range for mobility x 5 min, seat 13 Step up 4 inch 2x 10  Lateral step up 4 inch 2x 10  STS 2x 10  Rockerboard A/P and lateral - 1 minute x 2 each direction Hamstring curl isometric in seated with green ball 10 x 10 second holds   12/25/21 Rocking on bike with 5" holds end range for mobility x 5 min, seat 13 Gait with SPC 162f, good sequence, cueing for heel to toe mechanics and equal stride length.  Encouraged to begin inside home, if pain tolerable Knee drive on 8in step for flexion 10x 10" holds stretch only not painful TKE 20x 5" with RTB Hamstring stretch on 12in step 3x 30" Rockerboard 1' each DF/PF and lateral Tandem stance x 30" each   Manual: Decongestive techqniues for edema control with LE elevated   Traced redness  Supine: 10 heel slides   AROM 5-88 degrees    PATIENT EDUCATION:  Education details: HEP 6/19 reassessment, POC, HEP Person educated: Patient Education method: Explanation, Demonstration, and Handouts Education comprehension: verbalized understanding, returned demonstration, and needs further education   HOME EXERCISE PROGRAM: Access Code: [email or web address]: [email or web address] Date: 12/11/2021 Prepared by: [facility]  Exercises - Supine Ankle Pumps  - 2-3 x daily - 7 x weekly - 2 sets - 10 reps - Long Sitting Quad Set  - 2-3 x daily - 7 x weekly - 2 sets - 10 reps - Supine Hip Abduction AROM  - 2-3 x daily - 7 x weekly - 3 sets - 10 reps - Supine Heel Slide  -  2-3 x daily - 7 x weekly - 3 sets - 10 reps - Seated Knee Flexion Extension AROM   - 2-3 x daily - 7 x weekly - 3 sets - 10 reps - Seated Heel Toe Raises  - 2-3 x daily - 7 x weekly - 3 sets - 10 reps 12/16/21 - Seated Long Arc Quad  - 2 x daily - 7 x weekly - 2 sets - 10 reps - 5 second hold - Long Sitting Hamstring Set  - 2 x daily - 7 x weekly - 2 sets - 10 reps - 5 second holds hold  12/23/21:  Seated AAROM knee flexion 6/13 STS  ASSESSMENT:  CLINICAL IMPRESSION: Patient has met 2/2 short term goals and 2/4 long term goals with ability to complete HEP and improvement in symptoms, strength, activity tolerance and ROM. She continues to remain limited with knee flexion ROM and impaired mobility. Patient pleased with current mobility and is ready continue with HEP. Patient educated on continuing HEP and performing on LLE to improve mobility prior to L TKA in September. Patient discharged at this time from physical therapy.    OBJECTIVE IMPAIRMENTS Abnormal gait, decreased activity tolerance, decreased balance, decreased endurance, decreased mobility, difficulty walking, decreased ROM, decreased strength, hypomobility, increased edema, increased fascial restrictions, impaired perceived functional ability, impaired flexibility, and pain.   ACTIVITY LIMITATIONS carrying, lifting, bending, sitting, standing, squatting, sleeping, stairs, transfers, bed mobility, bathing, toileting, and dressing  PARTICIPATION LIMITATIONS: meal prep, cleaning, laundry, medication management, personal finances, driving, shopping, community activity, yard work, school, and church  PERSONAL FACTORS Age, Fitness, and Time since onset of injury/illness/exacerbation are also affecting patient's functional outcome.   REHAB POTENTIAL: Good  CLINICAL DECISION MAKING: Stable/uncomplicated  EVALUATION COMPLEXITY: Low   GOALS: Goals reviewed with patient? Yes  SHORT TERM GOALS: Target date: 01/04/2022    patient will  be independent with initial HEP Baseline: Goal status: MET  2.   Patient will improve Right knee ROM to -8 to 90 to improve functional mobility in home  Baseline:   Eval 12/23/21 01/05/22  Knee flexion 58 88 95  Knee extension -10 -5 Lacking 3   Goal status: MET  LONG TERM GOALS: Target date: 01/08/2022    Patient will be independent with advanced HEP and self management strategies to improve quality of life and functional outcomes.  Baseline:  Goal status: MET  2.   Patient with improved Right knee ROM to -5 to 120  to be able to safely navigate stairs   Baseline:  Knee flexion 58 01/05/22 95  7/11 100   Knee extension -10 Lacking 3 Lacking 2   Goal status: NOT MET  3.  Patient will increase Right knee MMTs to 5/5 to promote return to ambulation community distances with minimal deviation.   Baseline:  see MMT Goal status: MET  4.   Patient will improve FOTO score to predicted value to demonstrate improved functional mobility.  Baseline: 40 6/19 48% function 7/11 65% function Goal status: NOT MET PLAN: PT FREQUENCY: 2x/week  PT DURATION: 4 weeks  PLANNED INTERVENTIONS:  Therapeutic exercises, Therapeutic activity, Neuromuscular re-education, Balance training, Gait training, Patient/Family education, Joint manipulation, Joint mobilization, Stair training, Vestibular training, Canalith repositioning, Orthotic/Fit training, DME instructions, Aquatic Therapy, Dry Needling, Electrical stimulation, Spinal manipulation, Spinal mobilization, Cryotherapy, Moist heat, scar mobilization, Taping, Traction, Ultrasound, Biofeedback, Manual therapy, and Re-evaluation  PLAN FOR NEXT SESSION:  progress knee mobility and strengthening as able.     2:35 PM, 01/27/22 Mearl Latin PT, DPT Physical Therapist at [facility]

## 2022-01-29 ENCOUNTER — Encounter (HOSPITAL_COMMUNITY): Payer: Medicare PPO

## 2022-02-02 ENCOUNTER — Other Ambulatory Visit (HOSPITAL_COMMUNITY): Payer: Self-pay | Admitting: Internal Medicine

## 2022-02-02 DIAGNOSIS — Z1231 Encounter for screening mammogram for malignant neoplasm of breast: Secondary | ICD-10-CM

## 2022-02-09 ENCOUNTER — Ambulatory Visit (HOSPITAL_COMMUNITY)
Admission: RE | Admit: 2022-02-09 | Discharge: 2022-02-09 | Disposition: A | Discharge status: Home / Self Care | Payer: Medicare PPO | Source: Ambulatory Visit | Attending: Internal Medicine | Admitting: Internal Medicine

## 2022-02-09 DIAGNOSIS — Z1231 Encounter for screening mammogram for malignant neoplasm of breast: Secondary | ICD-10-CM | POA: Diagnosis not present

## 2022-02-20 DIAGNOSIS — M545 Low back pain, unspecified: Secondary | ICD-10-CM | POA: Diagnosis not present

## 2022-02-20 DIAGNOSIS — R030 Elevated blood-pressure reading, without diagnosis of hypertension: Secondary | ICD-10-CM | POA: Diagnosis not present

## 2022-02-20 DIAGNOSIS — Z79899 Other long term (current) drug therapy: Secondary | ICD-10-CM | POA: Diagnosis not present

## 2022-02-20 DIAGNOSIS — Z6834 Body mass index (BMI) 34.0-34.9, adult: Secondary | ICD-10-CM | POA: Diagnosis not present

## 2022-03-16 DIAGNOSIS — M1712 Unilateral primary osteoarthritis, left knee: Secondary | ICD-10-CM | POA: Diagnosis not present

## 2022-03-16 NOTE — H&P (Signed)
KNEE ARTHROPLASTY ADMISSION H&P  Patient ID: Shawna Lewis MRN: [id number] DOB/AGE: Feb 12, 1951 71 y.o.  Chief Complaint: left knee pain.  Planned Procedure Date: 04/07/22 Medical and Cardiac Clearance by Dr. Catalina Pizza   Pain management clearance by Merryl Hacker NP   HPI: Shawna Lewis is a 71 y.o. female who presents for evaluation of OA LEFT KNEE. The patient has a history of pain and functional disability in the left knee due to arthritis and has failed non-surgical conservative treatments for greater than 12 weeks to include NSAID's and/or analgesics, corticosteriod injections, viscosupplementation injections, use of assistive devices, and activity modification.  Onset of symptoms was gradual, starting >10 years ago with gradually worsening course since that time. The patient noted prior procedures on the knee to include  arthroscopy and menisectomy on the left knee.  Patient currently rates pain at 8 out of 10 with activity. Patient has night pain, worsening of pain with activity and weight bearing, and pain that interferes with activities of daily living.  Patient has evidence of subchondral sclerosis, periarticular osteophytes, and joint space narrowing by imaging studies.  There is no active infection.  Past Medical History:  Diagnosis Date   Anemia    Arthritis    DDD (degenerative disc disease), lumbar    Fibromyalgia    GERD (gastroesophageal reflux disease)    Heart murmur    Hypertension    Migraine    OCD (obsessive compulsive disorder)    Osteomyelitis ([facility])    Past Surgical History:  Procedure Laterality Date   ANKLE FRACTURE SURGERY  2018   BALLOON DILATION  05/23/2021   Procedure: BALLOON DILATION;  Surgeon: Dolores Frame, MD;  [facility];  Service: Gastroenterology;;  ascending colon strictures   BIOPSY  05/23/2021   Procedure: BIOPSY;  Surgeon: Dolores Frame, MD;  [facility];  Service: Gastroenterology;;    BREAST BIOPSY  1995   CATARACT EXTRACTION     CESAREAN SECTION  1986   COLONOSCOPY WITH PROPOFOL N/A 05/23/2021   Procedure: COLONOSCOPY WITH PROPOFOL;  Surgeon: Dolores Frame, MD;  [facility];  Service: Gastroenterology;  Laterality: N/A;  10:10   ESOPHAGOGASTRODUODENOSCOPY (EGD) WITH PROPOFOL N/A 05/23/2021   Procedure: ESOPHAGOGASTRODUODENOSCOPY (EGD) WITH PROPOFOL;  Surgeon: Dolores Frame, MD;  [facility];  Service: Gastroenterology;  Laterality: N/A;   IR LUMBAR DISC ASPIRATION W/IMG GUIDE  06/22/2019   KNEE ARTHROSCOPY W/ MENISCAL REPAIR     RADIOFREQUENCY ABLATION NERVES     TONSILLECTOMY  1974   TOTAL KNEE ARTHROPLASTY Right 12/09/2021   Procedure: TOTAL KNEE ARTHROPLASTY;  Surgeon: Sheral Apley, MD;  Location[facility];  Service: Orthopedics;  Laterality: Right;   Allergies  Allergen Reactions   Tetracycline Nausea Only   Vancomycin Other (See Comments)    Neutropenia/anemia (after 4 weeks of treatment)   Prior to Admission medications   Medication Sig Start Date End Date Taking? Authorizing Provider  acetaminophen (TYLENOL) 500 MG tablet Take 2 tablets (1,000 mg total) by mouth every 6 (six) hours as needed for moderate pain or mild pain. 12/09/21   Jenne Pane, PA-C  acyclovir (ZOVIRAX) 800 MG tablet Take 800 mg by mouth daily as needed (fever blisters).    [provider]  aspirin EC 81 MG tablet Take 1 tablet (81 mg total) by mouth 2 (two) times daily. To prevent blood clots for 30 days after surgery. 12/09/21   Jenne Pane, PA-C  Bacillus Coagulans-Inulin (PROBIOTIC-PREBIOTIC PO) Take 2 capsules by mouth daily with supper.    [provider]  felodipine (PLENDIL) 5 MG 24 hr tablet Take 5 mg by mouth every evening. 12/04/19   [provider]  ferrous sulfate 325 (65 FE) MG tablet Take 325 mg by mouth daily with supper.    [provider]  fluvoxaMINE (LUVOX) 100 MG tablet Take 100-250  mg by mouth See admin instructions. Take 1 tablet (100 mg) by mouth scheduled every evening, may increase to 2.5 tablets (250 mg) in the evening if OCD is heightened. 10/05/16   [provider]  furosemide (LASIX) 20 MG tablet Take 20-40 mg by mouth daily as needed (swelling/fluid).    [provider]  losartan (COZAAR) 50 MG tablet Take 50-100 mg by mouth See admin instructions. Take 1 tablet (50 mg) by mouth scheduled every morning, may increase to 1.5-2 tablets (75 mg-100 mg) in the morning if blood pressure is trending high. 01/16/20   [provider]  meclizine (ANTIVERT) 12.5 MG tablet Take 12.5 mg by mouth 2 (two) times daily as needed for dizziness.    [provider]  methocarbamol (ROBAXIN-750) 750 MG tablet Take 1 tablet (750 mg total) by mouth every 8 (eight) hours as needed for muscle spasms. 12/09/21   Jenne Pane, PA-C  Multiple Vitamins-Minerals (MULTIVITAMIN WITH MINERALS) tablet Take 1 tablet by mouth daily with supper.    [provider]  Omega-3 Fatty Acids (OMEGA 3 500 PO) Take 1,000 mg by mouth daily with supper.    [provider]  omeprazole (PRILOSEC) 20 MG capsule Take 1 capsule (20 mg total) by mouth daily. 05/14/21   Dolores Frame, MD  ondansetron (ZOFRAN-ODT) 4 MG disintegrating tablet Take 1 tablet (4 mg total) by mouth 2 (two) times daily as needed for nausea or vomiting. 12/09/21   Jenne Pane, PA-C  oxyCODONE (ROXICODONE) 5 MG immediate release tablet Take 1 tablet (5 mg total) by mouth every 8 (eight) hours as needed for severe pain or breakthrough pain (after surgery that is not covered by your normal daily dosage of Oxycodone 10mg  for chronic pain). 12/09/21 12/09/22  12/11/22, PA-C  Oxycodone HCl 10 MG TABS Take 10 mg by mouth 6 (six) times daily. 10/12/16   [provider]  oxymetazoline (AFRIN) 0.05 % nasal spray Place 1 spray into both nostrils 2 (two) times daily.    [provider]  PHENobarbital (LUMINAL) 64.8 MG tablet Take 64.8 mg by mouth 2 (two) times daily as needed (at onset of migraine aura).    [provider]  phenylephrine (SUDAFED PE) 10 MG TABS tablet Take 10 mg by mouth daily as needed (allergies/sinus issues.).    [provider]  pramipexole (MIRAPEX) 0.25 MG tablet Take 0.25 mg by mouth at bedtime.    [provider]  simethicone (MYLICON) 125 MG chewable tablet Chew 250 mg by mouth every 6 (six) hours as needed for flatulence.    [provider]  Turmeric (QC TUMERIC COMPLEX PO) Take 1 oz by mouth daily in the afternoon. Lyposomal Turmeric Elixir with black pepper    [provider]   Social History   Socioeconomic History   Marital status: Married    Spouse name: Not on file   Number of children: 1   Years of education: Master's   Highest education level: Not on file  Occupational History   Occupation: 10/14/16  Tobacco Use  Smoking status: Former   Smokeless tobacco: Never  Building services engineer Use: Never used  Substance and Sexual Activity   Alcohol use: Not Currently   Drug use: No   Sexual activity: Not on file  Other Topics Concern   Not on file  Social History Narrative   Lives at home w/ her husband   Right-handed   Caffeine: 0-1 cup of tea per day   Social Determinants of Health   Financial Resource Strain: Not on file  Food Insecurity: Not on file  Transportation Needs: Not on file  Physical Activity: Not on file  Stress: Not on file  Social Connections: Not on file   Family History  Problem Relation Age of Onset   COPD Mother    Heart disease Mother    Lymphoma Mother    Migraines Mother    Pancreatic cancer Father    Neuropathy Neg Hx     ROS: Currently denies lightheadedness, dizziness, Fever, chills, CP, SOB.   No personal history of DVT, PE, MI, or CVA. No loose teeth or dentures All other systems have been reviewed and were  otherwise currently negative with the exception of those mentioned in the HPI and as above.  Objective: Vitals: Ht: 5'2" Wt: 204 lbs Temp: 98.9 BP: 168/84 Pulse: 65 O2 94% on room air.   Physical Exam: General: Alert, NAD.  Antalgic Gait  HEENT: EOMI, Good Neck Extension  Pulm: No increased work of breathing.  Clear B/L A/P w/o crackle or wheeze.  CV: RRR, No m/g/r appreciated  GI: soft, NT, ND. BS x 4 quadrants Neuro: CN II-XII grossly intact without focal deficit.  Sensation intact distally Skin: No lesions in the area of chief complaint MSK/Surgical Site:  + JLT. ROM 20-90 degrees.  Decreased strength in extension and flexion.  +EHL/FHL.  NVI.  Pain and instability with varus and valgus stress.    Imaging Review Plain radiographs demonstrate severe degenerative joint disease of the left knee.   The overall alignment issignificant valgus. The bone quality appears to be fair for age and reported activity level.  Preoperative templating of the joint replacement has been completed, documented, and submitted to the Operating Room personnel in order to optimize intra-operative equipment management.  Assessment: OA LEFT KNEE Active Problems:   * No active hospital problems. *   Plan: Plan for Procedure(s): TOTAL KNEE ARTHROPLASTY  The patient history, physical exam, clinical judgement of the provider and imaging are consistent with end stage degenerative joint disease and total joint arthroplasty is deemed medically necessary. The treatment options including medical management, injection therapy, and arthroplasty were discussed at length. The risks and benefits of Procedure(s): TOTAL KNEE ARTHROPLASTY were presented and reviewed.  The risks of nonoperative treatment, versus surgical intervention including but not limited to continued pain, aseptic loosening, stiffness, dislocation/subluxation, infection, bleeding, nerve injury, blood clots, cardiopulmonary complications, morbidity,  mortality, among others were discussed. The patient verbalizes understanding and wishes to proceed with the plan.  Patient is being admitted for inpatient treatment for surgery, pain control, PT, prophylactic antibiotics, VTE prophylaxis, progressive ambulation, ADL's and discharge planning.   Dental prophylaxis discussed and recommended for 2 years postoperatively.  The patient does meet the criteria for TXA which will be used perioperatively.   ASA 81 mg BID will be used postoperatively for DVT prophylaxis in addition to SCDs, and early ambulation. Plan for Tylenol, Celebrex, Oxycodone for pain.   Baclofen for muscle spasms.  Has enough at home.  Zofran  for nausea and vomiting. Miralax for constipation prevention. Has enough at home.  Pharmacy- [facility] The patient is planning to be discharged home with OPPT and into the care of her husband Shawna Lewis who can be reached at [phone] Follow up appt 04/22/22 at 4:15pm     [place] Board Office [phone] 03/16/2022 5:39 PM

## 2022-03-24 DIAGNOSIS — G8929 Other chronic pain: Secondary | ICD-10-CM | POA: Diagnosis not present

## 2022-03-24 DIAGNOSIS — Z79899 Other long term (current) drug therapy: Secondary | ICD-10-CM | POA: Diagnosis not present

## 2022-03-24 DIAGNOSIS — R030 Elevated blood-pressure reading, without diagnosis of hypertension: Secondary | ICD-10-CM | POA: Diagnosis not present

## 2022-03-24 DIAGNOSIS — Z6833 Body mass index (BMI) 33.0-33.9, adult: Secondary | ICD-10-CM | POA: Diagnosis not present

## 2022-03-24 NOTE — Patient Instructions (Signed)
SURGICAL WAITING ROOM VISITATION Patients having surgery or a procedure may have no more than 2 support people in the waiting area - these visitors may rotate.   Children under the age of 39 must have an adult with them who is not the patient. If the patient needs to stay at the hospital during part of their recovery, the visitor guidelines for inpatient rooms apply. Pre-op nurse will coordinate an appropriate time for 1 support person to accompany patient in pre-op.  This support person may not rotate.    Please refer to the [facility] website for the visitor guidelines for Inpatients (after your surgery is over and you are in a regular room).       Your procedure is scheduled on:  04/05/22    Report to [facility] Main Entrance    Report to admitting at   [phone]   Call this number if you have problems the morning of surgery [phone]   Do not eat food :After Midnight.   After Midnight you may have the following liquids until _ 0430_____ AM DAY OF SURGERY  Water Non-Citrus Juices (without pulp, NO RED) Carbonated Beverages Black Coffee (NO MILK/CREAM OR CREAMERS, sugar ok)  Clear Tea (NO MILK/CREAM OR CREAMERS, sugar ok) regular and decaf                             Plain Jell-O (NO RED)                                           Fruit ices (not with fruit pulp, NO RED)                                     Popsicles (NO RED)                                                               Sports drinks like Gatorade (NO RED)                    The day of surgery:  Drink ONE (1) Pre-Surgery Clear Ensure or G2 at  0430 AM ( have completed by )  the morning of surgery. Drink in one sitting. Do not sip.  This drink was given to you during your hospital  pre-op appointment visit. Nothing else to drink after completing the  Pre-Surgery Clear Ensure or G2.         Oral Hygiene is also important to reduce your risk of infection.                                     Remember - BRUSH YOUR TEETH THE MORNING OF SURGERY WITH YOUR REGULAR TOOTHPASTE   Do NOT smoke after Midnight   Take these medicines the morning of surgery with A SIP OF WATER:  omeprazole, nasal spray   DO NOT TAKE ANY ORAL DIABETIC MEDICATIONS DAY OF YOUR SURGERY  Bring CPAP mask and tubing  day of surgery.                              You may not have any metal on your body including hair pins, jewelry, and body piercing             Do not wear make-up, lotions, powders, perfumes/cologne, or deodorant  Do not wear nail polish including gel and S&S, artificial/acrylic nails, or any other type of covering on natural nails including finger and toenails. If you have artificial nails, gel coating, etc. that needs to be removed by a nail salon please have this removed prior to surgery or surgery may need to be canceled/ delayed if the surgeon/ anesthesia feels like they are unable to be safely monitored.   Do not shave  48 hours prior to surgery.               Men may shave face and neck.   Do not bring valuables to the hospital. [facility] IS NOT             RESPONSIBLE   FOR VALUABLES.   Contacts, dentures or bridgework may not be worn into surgery.   Bring small overnight bag day of surgery.   DO NOT BRING YOUR HOME MEDICATIONS TO THE HOSPITAL. PHARMACY WILL DISPENSE MEDICATIONS LISTED ON YOUR MEDICATION LIST TO YOU DURING YOUR ADMISSION IN THE HOSPITAL!    Patients discharged on the day of surgery will not be allowed to drive home.  Someone NEEDS to stay with you for the first 24 hours after anesthesia.   Special Instructions: Bring a copy of your healthcare power of attorney and living will documents         the day of surgery if you haven't scanned them before.              Please read over the following fact sheets you were given: IF YOU HAVE QUESTIONS ABOUT YOUR PRE-OP INSTRUCTIONS PLEASE CALL [phone]     [facility] - Preparing for Surgery Before surgery, you can  play an important role.  Because skin is not sterile, your skin needs to be as free of germs as possible.  You can reduce the number of germs on your skin by washing with CHG (chlorahexidine gluconate) soap before surgery.  CHG is an antiseptic cleaner which kills germs and bonds with the skin to continue killing germs even after washing. Please DO NOT use if you have an allergy to CHG or antibacterial soaps.  If your skin becomes reddened/irritated stop using the CHG and inform your nurse when you arrive at Short Stay. Do not shave (including legs and underarms) for at least 48 hours prior to the first CHG shower.  You may shave your face/neck. Please follow these instructions carefully:  1.  Shower with CHG Soap the night before surgery and the  morning of Surgery.  2.  If you choose to wash your hair, wash your hair first as usual with your  normal  shampoo.  3.  After you shampoo, rinse your hair and body thoroughly to remove the  shampoo.                           4.  Use CHG as you would any other liquid soap.  You can apply chg directly  to the skin and wash  Gently with a scrungie or clean washcloth.  5.  Apply the CHG Soap to your body ONLY FROM THE NECK DOWN.   Do not use on face/ open                           Wound or open sores. Avoid contact with eyes, ears mouth and genitals (private parts).                       Wash face,  Genitals (private parts) with your normal soap.             6.  Wash thoroughly, paying special attention to the area where your surgery  will be performed.  7.  Thoroughly rinse your body with warm water from the neck down.  8.  DO NOT shower/wash with your normal soap after using and rinsing off  the CHG Soap.                9.  Pat yourself dry with a clean towel.            10.  Wear clean pajamas.            11.  Place clean sheets on your bed the night of your first shower and do not  sleep with pets. Day of Surgery : Do not apply any  lotions/deodorants the morning of surgery.  Please wear clean clothes to the hospital/surgery center.  FAILURE TO FOLLOW THESE INSTRUCTIONS MAY RESULT IN THE CANCELLATION OF YOUR SURGERY PATIENT SIGNATURE_________________________________  NURSE SIGNATURE__________________________________  ________________________________________________________________________

## 2022-03-24 NOTE — Progress Notes (Addendum)
Anesthesia Review:  PCP:10/07/21 - DR Nita Sells  Clearance on chart  Shawna Lewis and [facility] note of 10/02/21 Cardiologist : none  Chest x-ray : EKG : 11/26/21  Echo : 2020  Stress test: Cardiac Cath :  Activity level: can do a flight of stairs without difficutly  Sleep Study/ CPAP : none  Fasting Blood Sugar :      / Checks Blood Sugar -- times a day:   Blood Thinner/ Instructions /Last Dose: ASA / Instructions/ Last Dose :   Prediabetes- does not check glucose at home  Hgba1c- 03/25/22- 6.1

## 2022-03-25 ENCOUNTER — Encounter (HOSPITAL_COMMUNITY): Payer: Self-pay

## 2022-03-25 ENCOUNTER — Encounter (HOSPITAL_COMMUNITY)
Admission: RE | Admit: 2022-03-25 | Discharge: 2022-03-25 | Disposition: A | Discharge status: Home / Self Care | Payer: Medicare PPO | Source: Ambulatory Visit | Attending: Orthopedic Surgery | Admitting: Orthopedic Surgery

## 2022-03-25 ENCOUNTER — Other Ambulatory Visit: Payer: Self-pay

## 2022-03-25 VITALS — BP 155/80 | HR 67 | Temp 98.4°F | Resp 16 | Ht 61.0 in | Wt 192.0 lb

## 2022-03-25 DIAGNOSIS — Z01812 Encounter for preprocedural laboratory examination: Secondary | ICD-10-CM | POA: Diagnosis not present

## 2022-03-25 DIAGNOSIS — Z01818 Encounter for other preprocedural examination: Secondary | ICD-10-CM

## 2022-03-25 HISTORY — DX: Prediabetes: R73.03

## 2022-03-25 HISTORY — DX: Family history of other specified conditions: Z84.89

## 2022-03-25 LAB — HEMOGLOBIN A1C
Hgb A1c MFr Bld: 6.1 % — ABNORMAL HIGH (ref 4.8–5.6)
Mean Plasma Glucose: 128.37 mg/dL

## 2022-03-25 LAB — BASIC METABOLIC PANEL
Anion gap: 8 (ref 5–15)
BUN: 15 mg/dL (ref 8–23)
CO2: 25 mmol/L (ref 22–32)
Calcium: 9.6 mg/dL (ref 8.9–10.3)
Chloride: 107 mmol/L (ref 98–111)
Creatinine, Ser: 0.61 mg/dL (ref 0.44–1.00)
GFR, Estimated: >60 mL/min (ref >60)
Glucose, Bld: 113 mg/dL — ABNORMAL HIGH (ref 70–99)
Potassium: 4.3 mmol/L (ref 3.5–5.1)
Sodium: 140 mmol/L (ref 135–145)

## 2022-03-25 LAB — CBC
HCT: 41 % (ref 36.0–46.0)
Hemoglobin: 12.5 g/dL (ref 12.0–15.0)
MCH: 27.8 pg (ref 26.0–34.0)
MCHC: 30.5 g/dL (ref 30.0–36.0)
MCV: 91.3 fL (ref 80.0–100.0)
Platelets: 178 10*3/uL (ref 150–400)
RBC: 4.49 MIL/uL (ref 3.87–5.11)
RDW: 14.6 % (ref 11.5–15.5)
WBC: 5.3 10*3/uL (ref 4.0–10.5)
nRBC: 0 % (ref 0.0–0.2)

## 2022-03-25 LAB — SURGICAL PCR SCREEN
MRSA, PCR: NEGATIVE
Staphylococcus aureus: NEGATIVE

## 2022-03-25 LAB — GLUCOSE, CAPILLARY: Glucose-Capillary: 105 mg/dL — ABNORMAL HIGH (ref 70–99)

## 2022-03-30 ENCOUNTER — Telehealth (INDEPENDENT_AMBULATORY_CARE_PROVIDER_SITE_OTHER): Payer: Self-pay | Admitting: *Deleted

## 2022-03-30 NOTE — Telephone Encounter (Signed)
Discussed with patient per [facility] - If she tolerated the celebrex previously and is still on her omeprazole, she should be fine to take a short course of the celebrex, hemoglobin was stable a few days ago.   Patient verbalized understanding.

## 2022-03-30 NOTE — Telephone Encounter (Signed)
Patient being treated for strictures in colon. Had a dilation done last November. Tcs due this November. She has a question about nsaids that she was told not to take. Had total knee replacement back in may. Gave her celebrex for 2 weeks and low dose aspirin for one month. Has another total knee replacement sept 19th and they are asking for her to take two weeks of celebrex and one month of low dose aspirin and aspirin is required. Asking if celebrex will be a big problem for her to take. Reports it was very helpful with first knee and would like to take again.   Last seen 11/11/21.   [phone]

## 2022-04-07 ENCOUNTER — Encounter (HOSPITAL_COMMUNITY)
Admission: RE | Disposition: A | Discharge status: Home / Self Care | Payer: Self-pay | Source: Home / Self Care | Attending: Orthopedic Surgery

## 2022-04-07 ENCOUNTER — Ambulatory Visit (HOSPITAL_COMMUNITY): Payer: Medicare PPO

## 2022-04-07 ENCOUNTER — Encounter (HOSPITAL_COMMUNITY): Payer: Self-pay | Admitting: Orthopedic Surgery

## 2022-04-07 ENCOUNTER — Ambulatory Visit (HOSPITAL_BASED_OUTPATIENT_CLINIC_OR_DEPARTMENT_OTHER): Payer: Medicare PPO | Admitting: Certified Registered"

## 2022-04-07 ENCOUNTER — Ambulatory Visit (HOSPITAL_COMMUNITY)
Admission: RE | Admit: 2022-04-07 | Discharge: 2022-04-07 | Disposition: A | Discharge status: Home / Self Care | Payer: Medicare PPO | Attending: Orthopedic Surgery | Admitting: Orthopedic Surgery

## 2022-04-07 ENCOUNTER — Other Ambulatory Visit: Payer: Self-pay

## 2022-04-07 ENCOUNTER — Ambulatory Visit (HOSPITAL_COMMUNITY): Payer: Medicare PPO | Admitting: Physician Assistant

## 2022-04-07 DIAGNOSIS — M1712 Unilateral primary osteoarthritis, left knee: Secondary | ICD-10-CM

## 2022-04-07 DIAGNOSIS — Z96652 Presence of left artificial knee joint: Secondary | ICD-10-CM | POA: Diagnosis not present

## 2022-04-07 DIAGNOSIS — Z01818 Encounter for other preprocedural examination: Secondary | ICD-10-CM

## 2022-04-07 DIAGNOSIS — I10 Essential (primary) hypertension: Secondary | ICD-10-CM | POA: Insufficient documentation

## 2022-04-07 DIAGNOSIS — K219 Gastro-esophageal reflux disease without esophagitis: Secondary | ICD-10-CM | POA: Insufficient documentation

## 2022-04-07 DIAGNOSIS — M25462 Effusion, left knee: Secondary | ICD-10-CM | POA: Diagnosis not present

## 2022-04-07 DIAGNOSIS — Z471 Aftercare following joint replacement surgery: Secondary | ICD-10-CM | POA: Diagnosis not present

## 2022-04-07 DIAGNOSIS — M797 Fibromyalgia: Secondary | ICD-10-CM | POA: Diagnosis not present

## 2022-04-07 DIAGNOSIS — M1990 Unspecified osteoarthritis, unspecified site: Secondary | ICD-10-CM | POA: Diagnosis not present

## 2022-04-07 DIAGNOSIS — Z96642 Presence of left artificial hip joint: Secondary | ICD-10-CM | POA: Diagnosis not present

## 2022-04-07 DIAGNOSIS — Z87891 Personal history of nicotine dependence: Secondary | ICD-10-CM | POA: Insufficient documentation

## 2022-04-07 DIAGNOSIS — G8918 Other acute postprocedural pain: Secondary | ICD-10-CM | POA: Diagnosis not present

## 2022-04-07 DIAGNOSIS — M25762 Osteophyte, left knee: Secondary | ICD-10-CM | POA: Insufficient documentation

## 2022-04-07 HISTORY — PX: TOTAL KNEE ARTHROPLASTY: SHX125

## 2022-04-07 LAB — GLUCOSE, CAPILLARY: Glucose-Capillary: 116 mg/dL — ABNORMAL HIGH (ref 70–99)

## 2022-04-07 SURGERY — ARTHROPLASTY, KNEE, TOTAL
Anesthesia: Spinal | Site: Knee | Laterality: Left

## 2022-04-07 MED ORDER — DEXAMETHASONE SODIUM PHOSPHATE 10 MG/ML IJ SOLN
INTRAMUSCULAR | Status: AC
  Filled 2022-04-07: qty 1

## 2022-04-07 MED ORDER — POVIDONE-IODINE 10 % EX SWAB: 2.0000 | Freq: Once | CUTANEOUS | Status: DC

## 2022-04-07 MED ORDER — ACETAMINOPHEN 160 MG/5ML PO SOLN: 325.0000 mg | Freq: Once | ORAL | Status: DC | PRN

## 2022-04-07 MED ORDER — ORAL CARE MOUTH RINSE
15.0000 mL | Freq: Once | OROMUCOSAL | Status: AC
Start: 2022-04-07 — End: 2022-04-07

## 2022-04-07 MED ORDER — DEXAMETHASONE SODIUM PHOSPHATE 10 MG/ML IJ SOLN
8.0000 mg | Freq: Once | INTRAMUSCULAR | Status: AC
  Administered 2022-04-07: 8 mg via INTRAVENOUS

## 2022-04-07 MED ORDER — 0.9 % SODIUM CHLORIDE (POUR BTL) OPTIME
TOPICAL | Status: DC | PRN
  Administered 2022-04-07: 1000 mL

## 2022-04-07 MED ORDER — AMISULPRIDE (ANTIEMETIC) 5 MG/2ML IV SOLN: 10.0000 mg | Freq: Once | INTRAVENOUS | Status: DC | PRN

## 2022-04-07 MED ORDER — OXYCODONE HCL 5 MG PO TABS
ORAL_TABLET | ORAL | Status: AC
  Filled 2022-04-07: qty 10

## 2022-04-07 MED ORDER — PROMETHAZINE HCL 25 MG/ML IJ SOLN: 6.2500 mg | INTRAMUSCULAR | Status: DC | PRN

## 2022-04-07 MED ORDER — ACETAMINOPHEN 500 MG PO TABS
1000.0000 mg | ORAL_TABLET | Freq: Four times a day (QID) | ORAL | Status: DC
  Administered 2022-04-07: 1000 mg via ORAL

## 2022-04-07 MED ORDER — PROPOFOL 1000 MG/100ML IV EMUL
INTRAVENOUS | Status: AC
  Filled 2022-04-07: qty 100

## 2022-04-07 MED ORDER — BUPIVACAINE LIPOSOME 1.3 % IJ SUSP
INTRAMUSCULAR | Status: AC
  Filled 2022-04-07: qty 20

## 2022-04-07 MED ORDER — LACTATED RINGERS IV BOLUS: 250.0000 mL | Freq: Once | INTRAVENOUS | Status: DC

## 2022-04-07 MED ORDER — ACETAMINOPHEN 500 MG PO TABS
1000.0000 mg | ORAL_TABLET | Freq: Once | ORAL | Status: AC
  Administered 2022-04-07: 1000 mg via ORAL
  Filled 2022-04-07: qty 2

## 2022-04-07 MED ORDER — ONDANSETRON HCL 4 MG/2ML IJ SOLN
INTRAMUSCULAR | Status: AC
  Filled 2022-04-07: qty 2

## 2022-04-07 MED ORDER — PROPOFOL 10 MG/ML IV BOLUS
INTRAVENOUS | Status: AC
  Filled 2022-04-07: qty 20

## 2022-04-07 MED ORDER — SODIUM CHLORIDE 0.9 % IR SOLN
Status: DC | PRN
  Administered 2022-04-07: 1000 mL

## 2022-04-07 MED ORDER — HYDROMORPHONE HCL 1 MG/ML IJ SOLN: 0.2500 mg | INTRAMUSCULAR | Status: DC | PRN

## 2022-04-07 MED ORDER — OXYCODONE HCL 5 MG PO TABS: 10.0000 mg | ORAL_TABLET | Freq: Every day | ORAL | Status: DC

## 2022-04-07 MED ORDER — LACTATED RINGERS IV BOLUS
250.0000 mL | Freq: Once | INTRAVENOUS | Status: AC
  Administered 2022-04-07: 250 mL via INTRAVENOUS

## 2022-04-07 MED ORDER — SODIUM CHLORIDE (PF) 0.9 % IJ SOLN
INTRAMUSCULAR | Status: AC
  Filled 2022-04-07: qty 50

## 2022-04-07 MED ORDER — OXYCODONE HCL 5 MG PO TABS
5.0000 mg | ORAL_TABLET | ORAL | Status: DC | PRN
  Administered 2022-04-07: 10 mg via ORAL
  Administered 2022-04-07: 5 mg via ORAL

## 2022-04-07 MED ORDER — WATER FOR IRRIGATION, STERILE IR SOLN
Status: DC | PRN
  Administered 2022-04-07 (×2): 1000 mL

## 2022-04-07 MED ORDER — CEFAZOLIN SODIUM-DEXTROSE 2-4 GM/100ML-% IV SOLN
2.0000 g | INTRAVENOUS | Status: AC
  Administered 2022-04-07: 2 g via INTRAVENOUS
  Filled 2022-04-07: qty 100

## 2022-04-07 MED ORDER — TRANEXAMIC ACID-NACL 1000-0.7 MG/100ML-% IV SOLN: 1000.0000 mg | Freq: Once | INTRAVENOUS | Status: DC

## 2022-04-07 MED ORDER — MIDAZOLAM HCL 2 MG/2ML IJ SOLN
INTRAMUSCULAR | Status: AC
  Filled 2022-04-07: qty 2

## 2022-04-07 MED ORDER — PROPOFOL 500 MG/50ML IV EMUL
INTRAVENOUS | Status: DC | PRN
  Administered 2022-04-07: 40 ug/kg/min via INTRAVENOUS

## 2022-04-07 MED ORDER — BUPIVACAINE-EPINEPHRINE (PF) 0.25% -1:200000 IJ SOLN
INTRAMUSCULAR | Status: AC
  Filled 2022-04-07: qty 30

## 2022-04-07 MED ORDER — ACETAMINOPHEN 10 MG/ML IV SOLN: 1000.0000 mg | Freq: Once | INTRAVENOUS | Status: DC | PRN

## 2022-04-07 MED ORDER — LACTATED RINGERS IV BOLUS
500.0000 mL | Freq: Once | INTRAVENOUS | Status: AC
  Administered 2022-04-07: 500 mL via INTRAVENOUS

## 2022-04-07 MED ORDER — FENTANYL CITRATE (PF) 100 MCG/2ML IJ SOLN
INTRAMUSCULAR | Status: DC | PRN
  Administered 2022-04-07 (×2): 50 ug via INTRAVENOUS

## 2022-04-07 MED ORDER — LACTATED RINGERS IV SOLN: INTRAVENOUS | Status: DC

## 2022-04-07 MED ORDER — METHOCARBAMOL 500 MG IVPB - SIMPLE MED: 500.0000 mg | Freq: Four times a day (QID) | INTRAVENOUS | Status: DC | PRN

## 2022-04-07 MED ORDER — BUPIVACAINE IN DEXTROSE 0.75-8.25 % IT SOLN
INTRATHECAL | Status: DC | PRN
  Administered 2022-04-07: 1.6 mL via INTRATHECAL

## 2022-04-07 MED ORDER — LIDOCAINE 2% (20 MG/ML) 5 ML SYRINGE
INTRAMUSCULAR | Status: DC | PRN
  Administered 2022-04-07: 60 mg via INTRAVENOUS

## 2022-04-07 MED ORDER — OXYCODONE HCL 5 MG PO TABS
ORAL_TABLET | ORAL | Status: AC
  Filled 2022-04-07: qty 2

## 2022-04-07 MED ORDER — CEFAZOLIN SODIUM-DEXTROSE 2-4 GM/100ML-% IV SOLN: 2.0000 g | Freq: Four times a day (QID) | INTRAVENOUS | Status: DC

## 2022-04-07 MED ORDER — CHLORHEXIDINE GLUCONATE 0.12 % MT SOLN
15.0000 mL | Freq: Once | OROMUCOSAL | Status: AC
Start: 2022-04-07 — End: 2022-04-07
  Administered 2022-04-07: 15 mL via OROMUCOSAL

## 2022-04-07 MED ORDER — ACETAMINOPHEN 500 MG PO TABS: 1000.0000 mg | ORAL_TABLET | Freq: Four times a day (QID) | ORAL | 0 refills | Status: AC | PRN

## 2022-04-07 MED ORDER — MEPERIDINE HCL 50 MG/ML IJ SOLN: 6.2500 mg | INTRAMUSCULAR | Status: DC | PRN

## 2022-04-07 MED ORDER — BUPIVACAINE LIPOSOME 1.3 % IJ SUSP: 20.0000 mL | Freq: Once | INTRAMUSCULAR | Status: DC

## 2022-04-07 MED ORDER — OXYCODONE HCL 5 MG PO TABS
ORAL_TABLET | ORAL | Status: AC
  Filled 2022-04-07: qty 1

## 2022-04-07 MED ORDER — POVIDONE-IODINE 10 % EX SWAB
2.0000 | Freq: Once | CUTANEOUS | Status: AC
  Administered 2022-04-07: 2 via TOPICAL

## 2022-04-07 MED ORDER — ACETAMINOPHEN 500 MG PO TABS
ORAL_TABLET | ORAL | Status: AC
  Filled 2022-04-07: qty 2

## 2022-04-07 MED ORDER — ACETAMINOPHEN 325 MG PO TABS: 325.0000 mg | ORAL_TABLET | Freq: Once | ORAL | Status: DC | PRN

## 2022-04-07 MED ORDER — ONDANSETRON HCL 4 MG/2ML IJ SOLN
INTRAMUSCULAR | Status: DC | PRN
  Administered 2022-04-07: 4 mg via INTRAVENOUS

## 2022-04-07 MED ORDER — SODIUM CHLORIDE (PF) 0.9 % IJ SOLN
INTRAMUSCULAR | Status: DC | PRN
  Administered 2022-04-07: 80 mL

## 2022-04-07 MED ORDER — ASPIRIN 81 MG PO TBEC: 81.0000 mg | DELAYED_RELEASE_TABLET | Freq: Two times a day (BID) | ORAL | 0 refills | Status: DC

## 2022-04-07 MED ORDER — CELECOXIB 200 MG PO CAPS: 200.0000 mg | ORAL_CAPSULE | Freq: Two times a day (BID) | ORAL | 0 refills | Status: DC | PRN

## 2022-04-07 MED ORDER — METHOCARBAMOL 500 MG PO TABS: 500.0000 mg | ORAL_TABLET | Freq: Four times a day (QID) | ORAL | Status: DC | PRN

## 2022-04-07 MED ORDER — ONDANSETRON 4 MG PO TBDP: 4.0000 mg | ORAL_TABLET | Freq: Two times a day (BID) | ORAL | 0 refills | Status: DC | PRN

## 2022-04-07 MED ORDER — OXYCODONE HCL 5 MG PO TABS: 5.0000 mg | ORAL_TABLET | Freq: Four times a day (QID) | ORAL | 0 refills | Status: DC | PRN

## 2022-04-07 MED ORDER — PROPOFOL 500 MG/50ML IV EMUL
INTRAVENOUS | Status: AC
  Filled 2022-04-07: qty 50

## 2022-04-07 MED ORDER — MIDAZOLAM HCL 2 MG/2ML IJ SOLN
INTRAMUSCULAR | Status: DC | PRN
  Administered 2022-04-07 (×2): 1 mg via INTRAVENOUS

## 2022-04-07 MED ORDER — TRANEXAMIC ACID-NACL 1000-0.7 MG/100ML-% IV SOLN
1000.0000 mg | INTRAVENOUS | Status: AC
  Administered 2022-04-07: 1000 mg via INTRAVENOUS
  Filled 2022-04-07: qty 100

## 2022-04-07 MED ORDER — FENTANYL CITRATE (PF) 100 MCG/2ML IJ SOLN
INTRAMUSCULAR | Status: AC
  Filled 2022-04-07: qty 2

## 2022-04-07 SURGICAL SUPPLY — 48 items
BAG COUNTER SPONGE SURGICOUNT (BAG) IMPLANT
BLADE HEX COATED 2.75 (ELECTRODE) ×1 IMPLANT
BLADE SAG 18X100X1.27 (BLADE) ×1 IMPLANT
BLADE SAGITTAL 25.0X1.37X90 (BLADE) ×1 IMPLANT
BLADE SURG 15 STRL LF DISP TIS (BLADE) ×1 IMPLANT
BLADE SURG 15 STRL SS (BLADE) ×2
BLADE SURG SZ10 CARB STEEL (BLADE) IMPLANT
BNDG ELASTIC 6X10 VLCR STRL LF (GAUZE/BANDAGES/DRESSINGS) ×1 IMPLANT
BOWL SMART MIX CTS (DISPOSABLE) IMPLANT
CLSR STERI-STRIP ANTIMIC 1/2X4 (GAUZE/BANDAGES/DRESSINGS) ×1 IMPLANT
COVER SURGICAL LIGHT HANDLE (MISCELLANEOUS) ×1 IMPLANT
CUFF TOURN SGL QUICK 34 (TOURNIQUET CUFF) ×1
CUFF TRNQT CYL 34X4.125X (TOURNIQUET CUFF) ×1 IMPLANT
DRAPE U-SHAPE 47X51 STRL (DRAPES) ×1 IMPLANT
DRSG MEPILEX POST OP 4X12 (GAUZE/BANDAGES/DRESSINGS) ×1 IMPLANT
DURAPREP 26ML APPLICATOR (WOUND CARE) ×2 IMPLANT
FEMORAL RETAINING CRUC KNEE #4 (Knees) IMPLANT
GLOVE BIO SURGEON STRL SZ7.5 (GLOVE) ×2 IMPLANT
GLOVE BIOGEL PI IND STRL 7.5 (GLOVE) ×1 IMPLANT
GLOVE BIOGEL PI IND STRL 8 (GLOVE) ×1 IMPLANT
GLOVE SURG SYN 7.5  E (GLOVE) ×1
GLOVE SURG SYN 7.5 E (GLOVE) ×1 IMPLANT
GLOVE SURG SYN 7.5 PF PI (GLOVE) ×1 IMPLANT
GOWN SPEC L4 XLG W/TWL (GOWN DISPOSABLE) ×1 IMPLANT
GOWN STRL REUS W/ TWL LRG LVL3 (GOWN DISPOSABLE) ×1 IMPLANT
GOWN STRL REUS W/TWL LRG LVL3 (GOWN DISPOSABLE) ×1
HANDPIECE INTERPULSE COAX TIP (DISPOSABLE) ×1
HOLDER FOLEY CATH W/STRAP (MISCELLANEOUS) IMPLANT
IMMOBILIZER KNEE 22 UNIV (SOFTGOODS) ×1 IMPLANT
INSERT TRIATH CS X3 11 (Insert) IMPLANT
KNEE PATELLA ASYMMETRIC 9X29 (Knees) IMPLANT
KNEE TIBIAL COMPONENT SZ5 (Knees) IMPLANT
MANIFOLD NEPTUNE II (INSTRUMENTS) ×1 IMPLANT
NS IRRIG 1000ML POUR BTL (IV SOLUTION) ×1 IMPLANT
PACK TOTAL KNEE CUSTOM (KITS) ×1 IMPLANT
PIN FLUTED HEDLESS FIX 3.5X1/8 (PIN) IMPLANT
PROTECTOR NERVE ULNAR (MISCELLANEOUS) ×1 IMPLANT
SET HNDPC FAN SPRY TIP SCT (DISPOSABLE) ×1 IMPLANT
SPIKE FLUID TRANSFER (MISCELLANEOUS) ×1 IMPLANT
SUT MNCRL AB 3-0 PS2 18 (SUTURE) ×1 IMPLANT
SUT VIC AB 0 CT1 36 (SUTURE) ×1 IMPLANT
SUT VIC AB 1 CT1 36 (SUTURE) ×2 IMPLANT
SUT VIC AB 2-0 CT1 27 (SUTURE) ×1
SUT VIC AB 2-0 CT1 TAPERPNT 27 (SUTURE) ×1 IMPLANT
TRAY FOLEY MTR SLVR 14FR STAT (SET/KITS/TRAYS/PACK) IMPLANT
TRAY FOLEY MTR SLVR 16FR STAT (SET/KITS/TRAYS/PACK) IMPLANT
TUBE SUCTION HIGH CAP CLEAR NV (SUCTIONS) ×1 IMPLANT
WRAP KNEE MAXI GEL POST OP (GAUZE/BANDAGES/DRESSINGS) ×1 IMPLANT

## 2022-04-07 NOTE — Discharge Instructions (Signed)

## 2022-04-07 NOTE — Op Note (Signed)
DATE OF SURGERY:  04/07/2022 TIME: 9:49 AM  PATIENT NAME:  Shawna Lewis   AGE: 71 y.o.    PRE-OPERATIVE DIAGNOSIS:  OSTEOARTHRITIS LEFT KNEE  POST-OPERATIVE DIAGNOSIS:  Same  PROCEDURE:  Procedure(s): LEFT TOTAL KNEE ARTHROPLASTY   SURGEON:  Sheral Apley, MD   ASSISTANT:  Levester Fresh, PA-C, he was present and scrubbed throughout the case, critical for completion in a timely fashion, and for retraction, instrumentation, and closure.    OPERATIVE IMPLANTS: [employer] Triathlon CR. Press fit knee  Femur size 4, Tibia size 5, Patella size 29 3-peg oval button, with a 11 mm polyethylene insert.   PREOPERATIVE INDICATIONS:  Shawna Lewis is a 71 y.o. year old female with end stage bone on bone degenerative arthritis of the knee who failed conservative treatment, including injections, antiinflammatories, activity modification, and assistive devices, and had significant impairment of their activities of daily living, and elected for Total Knee Arthroplasty.   The risks, benefits, and alternatives were discussed at length including but not limited to the risks of infection, bleeding, nerve injury, stiffness, blood clots, the need for revision surgery, cardiopulmonary complications, among others, and they were willing to proceed.   OPERATIVE DESCRIPTION:  The patient was brought to the operative room and placed in a supine position.  General anesthesia was administered.  IV antibiotics were given.  The lower extremity was prepped and draped in the usual sterile fashion.  Time out was performed.  The leg was elevated and exsanguinated and the tourniquet was inflated.  Anterior approach was performed.  The patella was everted and osteophytes were removed.  The anterior horn of the medial and lateral meniscus was removed.   The distal femur was opened with the drill and the intramedullary distal femoral cutting jig was utilized, set at 5 degrees resecting 9 mm off the distal femur.   Care was taken to protect the collateral ligaments.  The distal femoral sizing jig was applied, taking care to avoid notching.  Then the 4-in-1 cutting jig was applied and the anterior and posterior femur was cut, along with the chamfer cuts.  All posterior osteophytes were removed.  The flexion gap was then measured and was symmetric with the extension gap.  Then the extramedullary tibial cutting jig was utilized making the appropriate cut using the anterior tibial crest as a reference building in appropriate posterior slope.  Care was taken during the cut to protect the medial and collateral ligaments.  The proximal tibia was removed along with the posterior horns of the menisci.  The PCL was sacrificed.    The extensor gap was measured and was approximately 30mm.    I completed the distal femoral preparation using the appropriate jig to prepare the box.  The patella was then measured, and cut with the saw.    The proximal tibia sized and prepared accordingly with the reamer and the punch, and then all components were trialed with the above sized poly insert.  The knee was found to have excellent balance and full motion.    The above named components were then impacted into place and Poly tibial piece and patella were inserted.  I was very happy with his stability and ROM  I performed a periarticular injection with marcaine and toradol  The knee was easily taken through a range of motion and the patella tracked well and the knee irrigated copiously and the parapatellar and subcutaneous tissue closed with vicryl, and monocryl with steri strips for the skin.  The incision was dressed with sterile gauze and the tourniquet released and the patient was awakened and returned to the PACU in stable and satisfactory condition.  There were no complications.  Total tourniquet time was roughly 75 minutes.   POSTOPERATIVE PLAN: post op Abx, DVT px: SCD's, TED's, Early ambulation and chemical px

## 2022-04-07 NOTE — Anesthesia Procedure Notes (Signed)
Anesthesia Regional Block: Adductor canal block   Pre-Anesthetic Checklist: , timeout performed,  Correct Patient, Correct Site, Correct Laterality,  Correct Procedure, Correct Position, site marked,  Risks and benefits discussed,  Surgical consent,  Pre-op evaluation,  At surgeon's request and post-op pain management  Laterality: Left  Prep: chloraprep       Needles:  Injection technique: Single-shot  Needle Type: Echogenic Stimulator Needle     Needle Length: 9cm  Needle Gauge: 21     Additional Needles:   Procedures:,,,, ultrasound used (permanent image in chart),,    Narrative:  Start time: 04/07/2022 7:15 AM End time: 04/07/2022 7:20 AM Injection made incrementally with aspirations every 5 mL.  Performed by: Personally  Anesthesiologist: Effie Berkshire, MD  Additional Notes: Patient tolerated the procedure well. Local anesthetic introduced in an incremental fashion under minimal resistance after negative aspirations. No paresthesias were elicited. After completion of the procedure, no acute issues were identified and patient continued to be monitored by RN.

## 2022-04-07 NOTE — Anesthesia Postprocedure Evaluation (Signed)
Anesthesia Post Note  Patient: SHABREKA COULON  Procedure(s) Performed: LEFT TOTAL KNEE ARTHROPLASTY (Left: Knee)     Patient location during evaluation: PACU Anesthesia Type: Spinal Level of consciousness: oriented and awake and alert Pain management: pain level controlled Vital Signs Assessment: post-procedure vital signs reviewed and stable Respiratory status: spontaneous breathing, respiratory function stable and patient connected to nasal cannula oxygen Cardiovascular status: blood pressure returned to baseline and stable Postop Assessment: no headache, no backache, no apparent nausea or vomiting and spinal receding Anesthetic complications: no   No notable events documented.  Last Vitals:  Vitals:   04/07/22 1245 04/07/22 1300  BP: (!) 178/79 (!) 177/74  Pulse: 72 81  Resp:    Temp:    SpO2: 97% 96%                Effie Berkshire

## 2022-04-07 NOTE — Evaluation (Signed)
Physical Therapy Evaluation Patient Details Name: Shawna Lewis MRN: [id number] DOB: 11-20-1950 Today's Date: 04/07/2022  History of Present Illness  Patient is 71 y.o. 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  Clinical Impression  Shawna Lewis is a 71 y.o. female POD 0 s/p L-TKA. Patient reports modified independence using rollator with mobility at baseline. Patient is now limited by functional impairments (see PT problem list below) and requires min guard for transfers and gait with RW. Patient was able to ambulate 60 feet with RW and min guard and cues for safe walker management. Patient educated on safe sequencing for stair mobility and verbalized safe guarding position for people assisting with mobility. Patient instructed in exercises to facilitate ROM and circulation. Patient will benefit from continued skilled PT interventions to address impairments and progress towards PLOF. Patient has met mobility goals at adequate level for discharge home; will continue to follow if pt continues acute stay to progress towards Mod I goals.        Recommendations for follow up therapy are one component of a multi-disciplinary discharge planning process, led by the attending physician.  Recommendations may be updated based on patient status, additional functional criteria and insurance authorization.  Follow Up Recommendations Follow physician's recommendations for discharge plan and follow up therapies      Assistance Recommended at Discharge Intermittent Supervision/Assistance  Patient can return home with the following  A little help with walking and/or transfers;A little help with bathing/dressing/bathroom;Assistance with cooking/housework;Assist for transportation;Help with stairs or ramp for entrance    Equipment Recommendations None recommended by PT  Recommendations for Other Services       Functional  Status Assessment Patient has had a recent decline in their functional status and demonstrates the ability to make significant improvements in function in a reasonable and predictable amount of time.     Precautions / Restrictions Precautions Precautions: Fall Restrictions Weight Bearing Restrictions: No Other Position/Activity Restrictions: wbat      Mobility  Bed Mobility Overal bed mobility: Needs Assistance Bed Mobility: Supine to Sit     Supine to sit: Min guard     General bed mobility comments: For safety only, no physical assist required    Transfers Overall transfer level: Needs assistance Equipment used: Rolling walker (2 wheels) Transfers: Sit to/from Stand Sit to Stand: Min guard, From elevated surface           General transfer comment: for safety only, no physical assist required from stretcher surface    Ambulation/Gait Ambulation/Gait assistance: Min guard, Supervision Gait Distance (Feet): 60 Feet Assistive device: Rolling walker (2 wheels) Gait Pattern/deviations: Step-through pattern Gait velocity: decreased     General Gait Details: Pt ambulated with RW and min guard progressed to supervision, no physical assist required or overt LOB noted.  Stairs Stairs: Yes Stairs assistance: Min guard Stair Management: One rail Left, Step to pattern, Sideways Number of Stairs: 2 General stair comments: Pt educated on stair mobility, pt verbalized understanding. Husband verified appropriate guarding position. Pt completed stair training with min guard, no physical assist required or overt LOB noted, VCs for sequencing.  Wheelchair Mobility    Modified Rankin (Stroke Patients Only)       Balance Overall balance assessment: Needs assistance Sitting-balance support: Feet supported, No upper extremity supported Sitting balance-Leahy Scale: Good     Standing balance support: Reliant on assistive device for balance, During functional activity,  Bilateral upper extremity  supported Standing balance-Leahy Scale: Poor                               Pertinent Vitals/Pain Pain Assessment Pain Assessment: 0-10 Pain Score: 7  Pain Location: left knee Pain Descriptors / Indicators: Operative site guarding Pain Intervention(s): Limited activity within patient's tolerance, Monitored during session, Repositioned, Ice applied    Home Living Family/patient expects to be discharged to:: Private residence Living Arrangements: Spouse/significant other Available Help at Discharge: Family;Available 24 hours/day Type of Home: House Home Access: Stairs to enter Entrance Stairs-Rails: Psychiatric nurse of Steps: 6   Home Layout: One level Home Equipment: Conservation officer, nature (2 wheels);Rollator (4 wheels);Cane - single point;Shower seat Additional Comments: 176/88, 95% HR87    Prior Function Prior Level of Function : Independent/Modified Independent             Mobility Comments: rollator primarily ADLs Comments: ind     Hand Dominance   Dominant Hand: Right    Extremity/Trunk Assessment   Upper Extremity Assessment Upper Extremity Assessment: Overall WFL for tasks assessed    Lower Extremity Assessment Lower Extremity Assessment: RLE deficits/detail;LLE deficits/detail RLE Deficits / Details: MMT ank DFPF 5/5 RLE Sensation: WNL LLE Deficits / Details: MMT ank DFPF 5/5, NO extensor lag noted LLE Sensation: WNL    Cervical / Trunk Assessment Cervical / Trunk Assessment: Kyphotic  Communication   Communication: No difficulties  Cognition Arousal/Alertness: Awake/alert Behavior During Therapy: WFL for tasks assessed/performed Overall Cognitive Status: Within Functional Limits for tasks assessed                                          General Comments General comments (skin integrity, edema, etc.): Husband John present    Exercises Total Joint Exercises Ankle Circles/Pumps:  AROM, Both, 10 reps Quad Sets: AROM, Left, 5 reps Short Arc Quad: AROM, Other reps (comment), Left (2) Heel Slides: AROM, Left, Other reps (comment) (3) Hip ABduction/ADduction: AROM, 5 reps, Left Straight Leg Raises: AROM, Left, Other reps (comment) (2)   Assessment/Plan    PT Assessment Patient needs continued PT services  PT Problem List Decreased strength;Decreased range of motion;Decreased activity tolerance;Decreased balance;Decreased mobility;Decreased coordination;Pain       PT Treatment Interventions DME instruction;Gait training;Stair training;Functional mobility training;Therapeutic activities;Therapeutic exercise;Balance training;Neuromuscular re-education;Patient/family education    PT Goals (Current goals can be found in the Care Plan section)  Acute Rehab PT Goals Patient Stated Goal: To go home PT Goal Formulation: With patient Time For Goal Achievement: 04/14/22 Potential to Achieve Goals: Good    Frequency 7X/week     Co-evaluation               AM-PAC PT "6 Clicks" Mobility  Outcome Measure Help needed turning from your back to your side while in a flat bed without using bedrails?: A Little Help needed moving from lying on your back to sitting on the side of a flat bed without using bedrails?: A Little Help needed moving to and from a bed to a chair (including a wheelchair)?: A Little Help needed standing up from a chair using your arms (e.g., wheelchair or bedside chair)?: A Little Help needed to walk in hospital room?: A Little Help needed climbing 3-5 steps with a railing? : A Little 6 Click Score: 18    End of Session Equipment  Utilized During Treatment: Gait belt Activity Tolerance: Patient tolerated treatment well;No increased pain Patient left: in chair;with call bell/phone within reach Nurse Communication: Mobility status PT Visit Diagnosis: Pain;Difficulty in walking, not elsewhere classified (R26.2) Pain - Right/Left: Left Pain - part  of body: Knee    Time: 4944-9675 PT Time Calculation (min) (ACUTE ONLY): 30 min   Charges:   PT Evaluation $PT Eval Low Complexity: 1 Low PT Treatments $Gait Training: 8-22 mins        Coolidge Breeze, PT, DPT [facility] Rehabilitation Department Office: [phone]  Coolidge Breeze 04/07/2022, 2:05 PM

## 2022-04-07 NOTE — Transfer of Care (Signed)
Immediate Anesthesia Transfer of Care Note  Patient: Shawna Lewis  Procedure(s) Performed: LEFT TOTAL KNEE ARTHROPLASTY (Left: Knee)  Patient Location: PACU  Anesthesia Type:Spinal  Level of Consciousness: awake, alert  and patient cooperative  Airway & Oxygen Therapy: Patient Spontanous Breathing and Patient connected to face mask oxygen  Post-op Assessment: Report given to RN and Post -op Vital signs reviewed and stable  Post vital signs: Reviewed and stable  Last Vitals:  Vitals Value Taken Time  BP 137/70 04/07/22 1016  Temp    Pulse 72 04/07/22 1017  Resp 21 04/07/22 1017  SpO2 100 % 04/07/22 1017  Vitals shown include unvalidated device data.  Last Pain:  Vitals:   04/07/22 0613  TempSrc: Oral  PainSc:          Complications: No notable events documented.

## 2022-04-07 NOTE — Progress Notes (Signed)
Orthopedic Tech Progress Note Patient Details:  Shawna Lewis [birth year] [id number]  Patient ID: Shawna Lewis, female   DOB: 1950-09-17, 71 y.o.   MRN: [id number]  Shawna Lewis 04/07/2022, 12:28 PM Left bone foam applied in pacu

## 2022-04-07 NOTE — Anesthesia Procedure Notes (Signed)
Spinal  Start time: 04/07/2022 8:03 AM End time: 04/07/2022 8:08 AM Reason for block: surgical anesthesia Staffing Performed: anesthesiologist  Anesthesiologist: Effie Berkshire, MD Performed by: Effie Berkshire, MD Authorized by: Effie Berkshire, MD   Preanesthetic Checklist Completed: patient identified, IV checked, site marked, risks and benefits discussed, surgical consent, monitors and equipment checked, pre-op evaluation and timeout performed Spinal Block Patient position: sitting Prep: DuraPrep and site prepped and draped Location: L4-5 Injection technique: single-shot Needle Needle type: Pencan  Needle gauge: 24 G Needle length: 10 cm Needle insertion depth: 10 cm Assessment Events: CSF return Additional Notes Patient tolerated well. No immediate complications.  Attempt #1 - L3-4 - No CSF Attempt #2 - L4-5 - Successful  Functioning IV was confirmed and monitors were applied. Sterile prep and drape, including hand hygiene and sterile gloves were used. The patient was positioned and the back was prepped. The skin was anesthetized with lidocaine. Free flow of clear CSF was obtained prior to injecting local anesthetic into the CSF. The spinal needle aspirated freely following injection. The needle was carefully withdrawn. The patient tolerated the procedure well.

## 2022-04-07 NOTE — Anesthesia Preprocedure Evaluation (Addendum)
Anesthesia Evaluation  Patient identified by MRN, date of birth, ID band Patient awake    Reviewed: Allergy & Precautions, NPO status , Patient's Chart, lab work & pertinent test results  Airway Mallampati: II  TM Distance: >3 FB Neck ROM: Full    Dental  (+) Teeth Intact, Dental Advisory Given   Pulmonary former smoker,    breath sounds clear to auscultation       Cardiovascular hypertension, Pt. on medications  Rhythm:Regular Rate:Normal     Neuro/Psych  Headaches, PSYCHIATRIC DISORDERS Anxiety    GI/Hepatic Neg liver ROS, GERD  Medicated,  Endo/Other  negative endocrine ROS  Renal/GU negative Renal ROS     Musculoskeletal  (+) Arthritis , Fibromyalgia -  Abdominal Normal abdominal exam  (+)   Peds  Hematology negative hematology ROS (+)   Anesthesia Other Findings   Reproductive/Obstetrics                            Anesthesia Physical Anesthesia Plan  ASA: 2  Anesthesia Plan: Spinal   Post-op Pain Management: Regional block*   Induction: Intravenous  PONV Risk Score and Plan: 3 and Ondansetron, Dexamethasone, Midazolam and Propofol infusion  Airway Management Planned: Natural Airway and Simple Face Mask  Additional Equipment: None  Intra-op Plan:   Post-operative Plan:   Informed Consent: I have reviewed the patients History and Physical, chart, labs and discussed the procedure including the risks, benefits and alternatives for the proposed anesthesia with the patient or authorized representative who has indicated his/her understanding and acceptance.       Plan Discussed with: CRNA  Anesthesia Plan Comments: (Lab Results      Component                Value               Date                      WBC                      5.3                 03/25/2022                HGB                      12.5                03/25/2022                HCT                      41.0                 03/25/2022                MCV                      91.3                03/25/2022                PLT                      178  03/25/2022           )       Anesthesia Quick Evaluation

## 2022-04-07 NOTE — Interval H&P Note (Signed)
History and Physical Interval Note:  04/07/2022 6:57 AM  Shawna Lewis  has presented today for surgery, with the diagnosis of OSTEOARTHRITIS LEFT KNEE.  The various methods of treatment have been discussed with the patient and family. After consideration of risks, benefits and other options for treatment, the patient has consented to  Procedure(s): TOTAL KNEE ARTHROPLASTY (Left) as a surgical intervention.  The patient's history has been reviewed, patient examined, no change in status, stable for surgery.  I have reviewed the patient's chart and labs.  Questions were answered to the patient's satisfaction.     Renette Butters

## 2022-04-08 ENCOUNTER — Encounter (HOSPITAL_COMMUNITY): Payer: Self-pay | Admitting: Orthopedic Surgery

## 2022-04-08 NOTE — Therapy (Unsigned)
OUTPATIENT PHYSICAL THERAPY LOWER EXTREMITY EVALUATION   Patient Name: Shawna Lewis MRN: [id number] DOB:Aug 22, 1950, 71 y.o., female Today's Date: 04/09/2022   PT End of Session - 04/09/22 1343     Visit Number 1    Number of Visits 12    Date for PT Re-Evaluation 05/21/22    Authorization Type [employer] Choice;  auth required; no VL    PT Start Time 1303    PT Stop Time 1340    PT Time Calculation (min) 37 min    Activity Tolerance Patient tolerated treatment well;No increased pain    Behavior During Therapy WFL for tasks assessed/performed             Past Medical History:  Diagnosis Date   Anemia    Arthritis    DDD (degenerative disc disease), lumbar    Family history of adverse reaction to anesthesia    father had pancreatic cancer received anesthesia with artery replacement and soon as received anesthesia had stroke never knew quite what happened per pt   Fibromyalgia    GERD (gastroesophageal reflux disease)    Heart murmur    Hypertension    Migraine    OCD (obsessive compulsive disorder)    Osteomyelitis (HCC)    Pre-diabetes    Past Surgical History:  Procedure Laterality Date   ANKLE FRACTURE SURGERY  2018   BALLOON DILATION  05/23/2021   Procedure: BALLOON DILATION;  Surgeon: Dolores Frame, MD;  [facility]: [facility];  Service: Gastroenterology;;  ascending colon strictures   BIOPSY  05/23/2021   Procedure: BIOPSY;  Surgeon: Dolores Frame, MD;  [facility]: [facility];  Service: Gastroenterology;;   BREAST BIOPSY  1995   CATARACT EXTRACTION     CESAREAN SECTION  1986   COLONOSCOPY WITH PROPOFOL N/A 05/23/2021   Procedure: COLONOSCOPY WITH PROPOFOL;  Surgeon: Dolores Frame, MD;  Location: [facility];  Service: Gastroenterology;  Laterality: N/A;  10:10   ESOPHAGOGASTRODUODENOSCOPY (EGD) WITH PROPOFOL N/A 05/23/2021   Procedure: ESOPHAGOGASTRODUODENOSCOPY (EGD) WITH PROPOFOL;  Surgeon: Dolores Frame, MD;  [facility]: [facility];  Service: Gastroenterology;  Laterality: N/A;   IR LUMBAR DISC ASPIRATION W/IMG GUIDE  06/22/2019   KNEE ARTHROSCOPY W/ MENISCAL REPAIR     RADIOFREQUENCY ABLATION NERVES     TONSILLECTOMY  1974   TOTAL KNEE ARTHROPLASTY Right 12/09/2021   Procedure: TOTAL KNEE ARTHROPLASTY;  Surgeon: Sheral Apley, MD;  Location: [facility];  Service: Orthopedics;  Laterality: Right;   TOTAL KNEE ARTHROPLASTY Left 04/07/2022   Procedure: LEFT TOTAL KNEE ARTHROPLASTY;  Surgeon: Sheral Apley, MD;  Location: [facility];  Service: Orthopedics;  Laterality: Left;   Patient Active Problem List   Diagnosis Date Noted   S/P total knee arthroplasty, left 04/07/2022   S/P total knee arthroplasty, right 12/09/2021   Positive FIT (fecal immunochemical test) 05/14/2021   Anemia 05/14/2021   Therapeutic drug monitoring 06/30/2019   Chronic left lumbar radiculopathy 06/21/2019   Diskitis 06/20/2019   Osteomyelitis (HCC) 06/20/2019   HTN (hypertension) 06/20/2019   DDD (degenerative disc disease), lumbar    Migraine    Chronic low back pain    Central stenosis of spinal canal     PCP: Nita Sells   REFERRING PROVIDER: Sheral Apley, MD  REFERRING DIAG: s/p Lt TKR  THERAPY DIAG:  Lt knee stiffness Muscle weakness Difficulty in walking    Rationale for Evaluation and Treatment Rehabilitation  ONSET DATE: DATE OF SURGERY:  04/07/2022  SUBJECTIVE:   SUBJECTIVE STATEMENT: Pt states that she had her Rt knee replaced in May and now she has had her lt replaced.  She currently is walking with a walker every hour 3-4 minutes and is using a CPM 6 hour a day.  Pt currently able to walk for 5 minutes, sit for 5-10 minutes, standing with walker 5 minutes   PERTINENT HISTORY: Rt TKR, chronic LBP  PAIN:  Are you having pain? Yes: NPRS scale: 6/10 Pain location: Lt knee  Pain description: throbbing  Aggravating factors: bending weight bearing  Relieving factors:  rest  PRECAUTIONS: None  WEIGHT BEARING RESTRICTIONS No  FALLS:  Has patient fallen in last 6 months? No  LIVING ENVIRONMENT: Lives with: lives with their family Lives in: House/apartment Stairs: Yes: External: 5 steps; on right going up Has following equipment at home: Single point cane, shower chair, and bed side commode  OCCUPATION: retired   PLOF: Independent  PATIENT GOALS To get her knee working well   OBJECTIVE:   PATIENT SURVEYS:  FOTO 17  COGNITION:  Overall cognitive status: Within functional limits for tasks assessed       EDEMA:  Circumferential: LT: 191/2"     POSTURE: No Significant postural limitations   LOWER EXTREMITY ROM:  Active ROM Right eval Left eval  Hip flexion    Hip extension    Hip abduction    Hip adduction    Hip internal rotation    Hip external rotation    Knee flexion  70  Knee extension 0 12  Ankle dorsiflexion    Ankle plantarflexion    Ankle inversion    Ankle eversion     (Blank rows = not tested)  LOWER EXTREMITY MMT:  MMT Right eval Left eval  Hip flexion    Hip extension    Hip abduction    Hip adduction    Hip internal rotation    Hip external rotation    Knee flexion  3/5  Knee extension  3/5  Ankle dorsiflexion  4/5  Ankle plantarflexion    Ankle inversion    Ankle eversion     (Blank rows = not tested)    FUNCTIONAL TESTS:  5 times sit to stand: With UE assist 22.12  2 minute walk test: 39' with rolling walker                       Single leg raise:  unable.     TODAY'S TREATMENT: 04/09/22:  Evaluation:                            LAQ x 5 x 2 sets                 Ankle pumps x 10                Quad set x 10                Heel slide x 5 x 2 sets                SLR x 5 x 2 sets    PATIENT EDUCATION:  Education details: HEP Person educated: Patient Education method: Explanation, Verbal cues, and Handouts Education comprehension: verbalized understanding and returned  demonstration   HOME EXERCISE PROGRAM: Access Code: [email or web address] URL: [email or web address] Date: 04/09/2022 Prepared by: Virgina Organ  Exercises - Seated Long Arc [place]  -  2 x daily - 7 x weekly - 1 sets - 10 reps - 3-5" hold - Supine Ankle Pumps  - 2 x daily - 7 x weekly - 1 sets - 10 reps - 3-5" hold - Long Sitting Quad Set  - 2 x daily - 7 x weekly - 1 sets - 10 reps - 3-5" hold - Supine Heel Slides  - 2 x daily - 7 x weekly - 1 sets - 10 reps - 3-5" hold - Active Straight Leg Raise with Quad Set  - 2 x daily - 7 x weekly - 1 sets - 10 reps - 3" hold  ASSESSMENT:  CLINICAL IMPRESSION: Patient is a 71 y.o. female who was seen today for physical therapy evaluation and treatment for Lt TKR.  Evaluation demonstrates decreased ROM, decreased strength, decreased activity tolerance, difficulty walking, increased edema, and increased pain.  Ms. Kuzel will benefit from skilled PT to address these deficits.   OBJECTIVE IMPAIRMENTS Abnormal gait, decreased activity tolerance, decreased balance, decreased mobility, difficulty walking, decreased ROM, decreased strength, increased edema, and pain.   ACTIVITY LIMITATIONS carrying, lifting, bending, sitting, standing, squatting, stairs, dressing, and locomotion level  PARTICIPATION LIMITATIONS: meal prep, cleaning, laundry, driving, shopping, community activity, and yard work  [employer] POTENTIAL: Good  CLINICAL DECISION MAKING: Stable/uncomplicated  EVALUATION COMPLEXITY: Low   GOALS: Goals reviewed with patient? Yes  SHORT TERM GOALS: Target date: 04/30/2022  PT to be I in HEP in order to increase her ROM to 5-90 Baseline: Goal status: INITIAL  2.  Pt strength to be increased by 1/2 grade to allow pt to feel confident walking with a cane. Baseline:  Goal status: INITIAL  3.  Pt to be able to sit for 30 minutes in comfort for car rides/ waiting at MD  Baseline:  Goal status: INITIAL  4.  PT pain level to be no  greater than a 4/10 to be able to walk for 15 minutes. Baseline:  Goal status: INITIAL   LONG TERM GOALS: Target date: 05/21/2022   PT to be I in an advanced  HEP to allow pt ROM to be 0-115 to allow pt to have a normalized gt and squat down.  Baseline:  Goal status: INITIAL  2.  PT strength to be increased one grade to allow pt to rise from a squatted position without having to use her UE. Baseline:  Goal status: INITIAL  3.  Pt pain in Lt LE to be no greater than a 2/10 to allow pt to be on her feet with no assistive device for an hour at a time for shopping/community events. Baseline:  Goal status: INITIAL  4.  PT to have improved power to be able to complete 5 sit to stand in 12 seconds or less Baseline:  Goal status: INITIAL  5.  PT to be able to walk 225 feet in two minutes or less with no assistive device.  Baseline:  Goal status: INITIAL   PLAN: PT FREQUENCY: 2x/week  PT DURATION: 6 weeks  PLANNED INTERVENTIONS: Therapeutic exercises, Therapeutic activity, Neuromuscular re-education, Balance training, Gait training, Patient/Family education, Self Care, and Manual therapy  PLAN FOR NEXT SESSION: Progress ROM, strength and balance.   Rayetta Humphrey, [place] CLT [phone]

## 2022-04-09 ENCOUNTER — Ambulatory Visit (HOSPITAL_COMMUNITY): Payer: Medicare PPO | Attending: Orthopedic Surgery | Admitting: Physical Therapy

## 2022-04-09 DIAGNOSIS — M25662 Stiffness of left knee, not elsewhere classified: Secondary | ICD-10-CM | POA: Diagnosis not present

## 2022-04-09 DIAGNOSIS — R262 Difficulty in walking, not elsewhere classified: Secondary | ICD-10-CM | POA: Diagnosis not present

## 2022-04-09 DIAGNOSIS — M25562 Pain in left knee: Secondary | ICD-10-CM | POA: Insufficient documentation

## 2022-04-09 DIAGNOSIS — Z96651 Presence of right artificial knee joint: Secondary | ICD-10-CM | POA: Insufficient documentation

## 2022-04-09 DIAGNOSIS — M6281 Muscle weakness (generalized): Secondary | ICD-10-CM | POA: Diagnosis not present

## 2022-04-09 DIAGNOSIS — M1711 Unilateral primary osteoarthritis, right knee: Secondary | ICD-10-CM | POA: Diagnosis not present

## 2022-04-13 DIAGNOSIS — R7303 Prediabetes: Secondary | ICD-10-CM | POA: Diagnosis not present

## 2022-04-13 DIAGNOSIS — I10 Essential (primary) hypertension: Secondary | ICD-10-CM | POA: Diagnosis not present

## 2022-04-13 DIAGNOSIS — D649 Anemia, unspecified: Secondary | ICD-10-CM | POA: Diagnosis not present

## 2022-04-14 ENCOUNTER — Ambulatory Visit (HOSPITAL_COMMUNITY): Payer: Medicare PPO | Admitting: Physical Therapy

## 2022-04-14 ENCOUNTER — Encounter (HOSPITAL_COMMUNITY): Payer: Self-pay | Admitting: Physical Therapy

## 2022-04-14 DIAGNOSIS — M25562 Pain in left knee: Secondary | ICD-10-CM

## 2022-04-14 DIAGNOSIS — M25662 Stiffness of left knee, not elsewhere classified: Secondary | ICD-10-CM

## 2022-04-14 DIAGNOSIS — M1711 Unilateral primary osteoarthritis, right knee: Secondary | ICD-10-CM | POA: Diagnosis not present

## 2022-04-14 DIAGNOSIS — R262 Difficulty in walking, not elsewhere classified: Secondary | ICD-10-CM | POA: Diagnosis not present

## 2022-04-14 DIAGNOSIS — M6281 Muscle weakness (generalized): Secondary | ICD-10-CM | POA: Diagnosis not present

## 2022-04-14 DIAGNOSIS — Z96651 Presence of right artificial knee joint: Secondary | ICD-10-CM | POA: Diagnosis not present

## 2022-04-14 NOTE — Therapy (Signed)
OUTPATIENT PHYSICAL THERAPY TREATMENT   Patient Name: Shawna Lewis MRN: [id number] DOB:31-Dec-1950, 71 y.o., female Today's Date: 04/14/2022   PT End of Session - 04/14/22 1301     Visit Number 2    Number of Visits 12    Date for PT Re-Evaluation 05/21/22    Authorization Type [employer] Choice;  auth required; no VL    Authorization Time Period 12 visits required 9/21- 05/21/22    Authorization - Visit Number 2    Authorization - Number of Visits 12    Progress Note Due on Visit 10    PT Start Time 1301    PT Stop Time 1339    PT Time Calculation (min) 38 min    Activity Tolerance Patient tolerated treatment well;No increased pain    Behavior During Therapy WFL for tasks assessed/performed             Past Medical History:  Diagnosis Date   Anemia    Arthritis    DDD (degenerative disc disease), lumbar    Family history of adverse reaction to anesthesia    father had pancreatic cancer received anesthesia with artery replacement and soon as received anesthesia had stroke never knew quite what happened per pt   Fibromyalgia    GERD (gastroesophageal reflux disease)    Heart murmur    Hypertension    Migraine    OCD (obsessive compulsive disorder)    Osteomyelitis (HCC)    Pre-diabetes    Past Surgical History:  Procedure Laterality Date   ANKLE FRACTURE SURGERY  2018   BALLOON DILATION  05/23/2021   Procedure: BALLOON DILATION;  Surgeon: Dolores Frame, MD;  [facility];  Service: Gastroenterology;;  ascending colon strictures   BIOPSY  05/23/2021   Procedure: BIOPSY;  Surgeon: Dolores Frame, MD;  [facility];  Service: Gastroenterology;;   BREAST BIOPSY  1995   CATARACT EXTRACTION     CESAREAN SECTION  1986   COLONOSCOPY WITH PROPOFOL N/A 05/23/2021   Procedure: COLONOSCOPY WITH PROPOFOL;  Surgeon: Dolores Frame, MD;  [facility];  Service: Gastroenterology;  Laterality: N/A;  10:10    ESOPHAGOGASTRODUODENOSCOPY (EGD) WITH PROPOFOL N/A 05/23/2021   Procedure: ESOPHAGOGASTRODUODENOSCOPY (EGD) WITH PROPOFOL;  Surgeon: Dolores Frame, MD;  [facility];  Service: Gastroenterology;  Laterality: N/A;   IR LUMBAR DISC ASPIRATION W/IMG GUIDE  06/22/2019   KNEE ARTHROSCOPY W/ MENISCAL REPAIR     RADIOFREQUENCY ABLATION NERVES     TONSILLECTOMY  1974   TOTAL KNEE ARTHROPLASTY Right 12/09/2021   Procedure: TOTAL KNEE ARTHROPLASTY;  Surgeon: Sheral Apley, MD;  Location: [facility];  Service: Orthopedics;  Laterality: Right;   TOTAL KNEE ARTHROPLASTY Left 04/07/2022   Procedure: LEFT TOTAL KNEE ARTHROPLASTY;  Surgeon: Sheral Apley, MD;  Location: [facility];  Service: Orthopedics;  Laterality: Left;   Patient Active Problem List   Diagnosis Date Noted   S/P total knee arthroplasty, left 04/07/2022   S/P total knee arthroplasty, right 12/09/2021   Positive FIT (fecal immunochemical test) 05/14/2021   Anemia 05/14/2021   Therapeutic drug monitoring 06/30/2019   Chronic left lumbar radiculopathy 06/21/2019   Diskitis 06/20/2019   Osteomyelitis (HCC) 06/20/2019   HTN (hypertension) 06/20/2019   DDD (degenerative disc disease), lumbar    Migraine    Chronic low back pain    Central stenosis of spinal canal     PCP: Nita Sells   REFERRING PROVIDER: Margarita Rana  D, MD  REFERRING DIAG: s/p Lt TKR  THERAPY DIAG:  Lt knee stiffness Muscle weakness Difficulty in walking    Rationale for Evaluation and Treatment Rehabilitation  ONSET DATE: DATE OF SURGERY:  04/07/2022  SUBJECTIVE:   SUBJECTIVE STATEMENT: Pt states she is going good. Working on [employer]. Been trying SPC at home.   PERTINENT HISTORY: Rt TKR, chronic LBP  PAIN:  Are you having pain? Yes: NPRS scale: 3/10 Pain location: Lt knee  Pain description: throbbing  Aggravating factors: bending weight bearing  Relieving factors: rest  PRECAUTIONS: None  WEIGHT BEARING RESTRICTIONS  No  FALLS:  Has patient fallen in last 6 months? No  LIVING ENVIRONMENT: Lives with: lives with their family Lives in: House/apartment Stairs: Yes: External: 5 steps; on right going up Has following equipment at home: Single point cane, shower chair, and bed side commode  OCCUPATION: retired   PLOF: Independent  PATIENT GOALS To get her knee working well   OBJECTIVE:   PATIENT SURVEYS:  FOTO 11  COGNITION:  Overall cognitive status: Within functional limits for tasks assessed       EDEMA:  Circumferential: LT: 191/2"     POSTURE: No Significant postural limitations   LOWER EXTREMITY ROM:  Active ROM Right eval Left eval 04/14/22 Left  Hip flexion     Hip extension     Hip abduction     Hip adduction     Hip internal rotation     Hip external rotation     Knee flexion  70 80  Knee extension 0 12 3  Ankle dorsiflexion     Ankle plantarflexion     Ankle inversion     Ankle eversion      (Blank rows = not tested)  LOWER EXTREMITY MMT:  MMT Right eval Left eval  Hip flexion    Hip extension    Hip abduction    Hip adduction    Hip internal rotation    Hip external rotation    Knee flexion  3/5  Knee extension  3/5  Ankle dorsiflexion  4/5  Ankle plantarflexion    Ankle inversion    Ankle eversion     (Blank rows = not tested)    FUNCTIONAL TESTS:  5 times sit to stand: With UE assist 22.12  2 minute walk test: 67' with rolling walker                       Single leg raise:  unable.     TODAY'S TREATMENT: 04/14/22 Quad sets 10x 10 second hold SLR 2x 10  Heel slides with belt 10 x 5-10 second hold LAQ 10 x 5 second holds 2 sets  Seated heel slides 10 x 10 second holds Hamstring isometrics into green ball - supine 10 x 10 second holds HR/TR 1x 10    04/09/22:  Evaluation:                            LAQ x 5 x 2 sets                 Ankle pumps x 10                Quad set x 10                Heel slide x 5 x 2 sets  SLR x 5 x 2 sets    PATIENT EDUCATION:  Education details: HEP Person educated: Patient Education method: Explanation, Verbal cues, and Handouts Education comprehension: verbalized understanding and returned demonstration   HOME EXERCISE PROGRAM: Access Code: [email or web address]  Date: 04/09/2022 - Seated Long Arc Quad  - 2 x daily - 7 x weekly - 1 sets - 10 reps - 3-5" hold - Supine Ankle Pumps  - 2 x daily - 7 x weekly - 1 sets - 10 reps - 3-5" hold - Long Sitting Quad Set  - 2 x daily - 7 x weekly - 1 sets - 10 reps - 3-5" hold - Supine Heel Slides  - 2 x daily - 7 x weekly - 1 sets - 10 reps - 3-5" hold - Active Straight Leg Raise with Quad Set  - 2 x daily - 7 x weekly - 1 sets - 10 reps - 3" hold  ASSESSMENT:  CLINICAL IMPRESSION: Began session with previously completed exercises which are tolerated well. Patient demonstrating improving ROM from lacking 3 to 80. She tolerates additional reps of previously completed exercises. Educated on completing more reps of flexion exercises at home and performing additional reps of other exercises. Patient will continue to benefit from physical therapy in order to improve function and reduce impairment.    OBJECTIVE IMPAIRMENTS Abnormal gait, decreased activity tolerance, decreased balance, decreased mobility, difficulty walking, decreased ROM, decreased strength, increased edema, and pain.   ACTIVITY LIMITATIONS carrying, lifting, bending, sitting, standing, squatting, stairs, dressing, and locomotion level  PARTICIPATION LIMITATIONS: meal prep, cleaning, laundry, driving, shopping, community activity, and yard work  [facility] POTENTIAL: Good  CLINICAL DECISION MAKING: Stable/uncomplicated  EVALUATION COMPLEXITY: Low   GOALS: Goals reviewed with patient? Yes  SHORT TERM GOALS: Target date: 04/30/2022  PT to be I in HEP in order to increase her ROM to 5-90 Baseline: Goal status: INITIAL  2.  Pt strength to be increased by 1/2 grade to allow  pt to feel confident walking with a cane. Baseline:  Goal status: INITIAL  3.  Pt to be able to sit for 30 minutes in comfort for car rides/ waiting at [facility]  Baseline:  Goal status: INITIAL  4.  PT pain level to be no greater than a 4/10 to be able to walk for 15 minutes. Baseline:  Goal status: INITIAL   LONG TERM GOALS: Target date: 05/21/2022   PT to be I in an advanced  HEP to allow pt ROM to be 0-115 to allow pt to have a normalized gt and squat down.  Baseline:  Goal status: INITIAL  2.  PT strength to be increased one grade to allow pt to rise from a squatted position without having to use her UE. Baseline:  Goal status: INITIAL  3.  Pt pain in Lt LE to be no greater than a 2/10 to allow pt to be on her feet with no assistive device for an hour at a time for shopping/community events. Baseline:  Goal status: INITIAL  4.  PT to have improved power to be able to complete 5 sit to stand in 12 seconds or less Baseline:  Goal status: INITIAL  5.  PT to be able to walk 225 feet in two minutes or less with no assistive device.  Baseline:  Goal status: INITIAL   PLAN: PT FREQUENCY: 2x/week  PT DURATION: 6 weeks  PLANNED INTERVENTIONS: Therapeutic exercises, Therapeutic activity, Neuromuscular re-education, Balance training, Gait training, Patient/Family education, Self Care, and Manual  therapy  PLAN FOR NEXT SESSION: Progress ROM, strength and balance.   1:02 PM, 04/14/22 Wyman Songster PT, DPT Physical Therapist at [facility]

## 2022-04-16 ENCOUNTER — Ambulatory Visit (HOSPITAL_COMMUNITY): Payer: Medicare PPO

## 2022-04-16 DIAGNOSIS — Z96651 Presence of right artificial knee joint: Secondary | ICD-10-CM | POA: Diagnosis not present

## 2022-04-16 DIAGNOSIS — M1711 Unilateral primary osteoarthritis, right knee: Secondary | ICD-10-CM | POA: Diagnosis not present

## 2022-04-16 DIAGNOSIS — R262 Difficulty in walking, not elsewhere classified: Secondary | ICD-10-CM

## 2022-04-16 DIAGNOSIS — M25662 Stiffness of left knee, not elsewhere classified: Secondary | ICD-10-CM | POA: Diagnosis not present

## 2022-04-16 DIAGNOSIS — M25562 Pain in left knee: Secondary | ICD-10-CM | POA: Diagnosis not present

## 2022-04-16 DIAGNOSIS — M6281 Muscle weakness (generalized): Secondary | ICD-10-CM | POA: Diagnosis not present

## 2022-04-16 NOTE — Therapy (Signed)
OUTPATIENT PHYSICAL THERAPY TREATMENT   Patient Name: Shawna Lewis MRN: [id number] DOB:[birth year], 71 y.o., female Today's Date: 04/16/2022   PT End of Session - 04/16/22 1302     Visit Number 3    Number of Visits 12    Date for PT Re-Evaluation 05/21/22    Authorization Type [employer];  auth required; no VL    Authorization Time Period 12 visits required 9/21- 05/21/22    Authorization - Visit Number 3    Authorization - Number of Visits 12    Progress Note Due on Visit 10    PT Start Time 1300    PT Stop Time 1340    PT Time Calculation (min) 40 min    Activity Tolerance Patient tolerated treatment well;No increased pain    Behavior During Therapy WFL for tasks assessed/performed              Past Medical History:  Diagnosis Date   Anemia    Arthritis    DDD (degenerative disc disease), lumbar    Family history of adverse reaction to anesthesia    father had pancreatic cancer received anesthesia with artery replacement and soon as received anesthesia had stroke never knew quite what happened per pt   Fibromyalgia    GERD (gastroesophageal reflux disease)    Heart murmur    Hypertension    Migraine    OCD (obsessive compulsive disorder)    Osteomyelitis (HCC)    Pre-diabetes    Past Surgical History:  Procedure Laterality Date   ANKLE FRACTURE SURGERY  2018   BALLOON DILATION  05/23/2021   Procedure: BALLOON DILATION;  Surgeon: Dolores Frame, MD;  Location: [facility];  Service: Gastroenterology;;  ascending colon strictures   BIOPSY  05/23/2021   Procedure: BIOPSY;  Surgeon: Dolores Frame, MD;  Location: [facility];  Service: Gastroenterology;;   BREAST BIOPSY  1995   CATARACT EXTRACTION     CESAREAN SECTION  1986   COLONOSCOPY WITH PROPOFOL N/A 05/23/2021   Procedure: COLONOSCOPY WITH PROPOFOL;  Surgeon: Dolores Frame, MD;  Location: [facility];  Service: Gastroenterology;  Laterality: N/A;  10:10    ESOPHAGOGASTRODUODENOSCOPY (EGD) WITH PROPOFOL N/A 05/23/2021   Procedure: ESOPHAGOGASTRODUODENOSCOPY (EGD) WITH PROPOFOL;  Surgeon: Dolores Frame, MD;  Location: [facility];  Service: Gastroenterology;  Laterality: N/A;   IR LUMBAR DISC ASPIRATION W/IMG GUIDE  06/22/2019   KNEE ARTHROSCOPY W/ MENISCAL REPAIR     RADIOFREQUENCY ABLATION NERVES     TONSILLECTOMY  1974   TOTAL KNEE ARTHROPLASTY Right 12/09/2021   Procedure: TOTAL KNEE ARTHROPLASTY;  Surgeon: Sheral Apley, MD;  Location: [facility];  Service: Orthopedics;  Laterality: Right;   TOTAL KNEE ARTHROPLASTY Left 04/07/2022   Procedure: LEFT TOTAL KNEE ARTHROPLASTY;  Surgeon: Sheral Apley, MD;  Location: [facility];  Service: Orthopedics;  Laterality: Left;   Patient Active Problem List   Diagnosis Date Noted   S/P total knee arthroplasty, left 04/07/2022   S/P total knee arthroplasty, right 12/09/2021   Positive FIT (fecal immunochemical test) 05/14/2021   Anemia 05/14/2021   Therapeutic drug monitoring 06/30/2019   Chronic left lumbar radiculopathy 06/21/2019   Diskitis 06/20/2019   Osteomyelitis (HCC) 06/20/2019   HTN (hypertension) 06/20/2019   DDD (degenerative disc disease), lumbar    Migraine    Chronic low back pain    Central stenosis of spinal canal     PCP: Nita Sells   REFERRING PROVIDER: Eulah Pont,  Jewel Baize, MD  REFERRING DIAG: s/p Lt TKR  THERAPY DIAG:  Lt knee stiffness Muscle weakness Difficulty in walking    Rationale for Evaluation and Treatment Rehabilitation  ONSET DATE: DATE OF SURGERY:  04/07/2022  SUBJECTIVE:   SUBJECTIVE STATEMENT: Pt states she is going good. Working on [employer]. Been trying SPC at home.   PERTINENT HISTORY: Rt TKR, chronic LBP  PAIN:  Are you having pain? Yes: NPRS scale: 7/10 Pain location: Lt knee  Pain description: throbbing  Aggravating factors: bending weight bearing  Relieving factors: rest  PRECAUTIONS: None  WEIGHT BEARING RESTRICTIONS  No  FALLS:  Has patient fallen in last 6 months? No  LIVING ENVIRONMENT: Lives with: lives with their family Lives in: House/apartment Stairs: Yes: External: 5 steps; on right going up Has following equipment at home: Single point cane, shower chair, and bed side commode  OCCUPATION: retired   PLOF: Independent  PATIENT GOALS To get her knee working well   OBJECTIVE:   PATIENT SURVEYS:  FOTO 98  COGNITION:  Overall cognitive status: Within functional limits for tasks assessed       EDEMA:  Circumferential: LT: 191/2"     POSTURE: No Significant postural limitations   LOWER EXTREMITY ROM:  Active ROM Right eval Left eval 04/14/22 Left  Hip flexion     Hip extension     Hip abduction     Hip adduction     Hip internal rotation     Hip external rotation     Knee flexion  70 80  Knee extension 0 12 3  Ankle dorsiflexion     Ankle plantarflexion     Ankle inversion     Ankle eversion      (Blank rows = not tested)  LOWER EXTREMITY MMT:  MMT Right eval Left eval  Hip flexion    Hip extension    Hip abduction    Hip adduction    Hip internal rotation    Hip external rotation    Knee flexion  3/5  Knee extension  3/5  Ankle dorsiflexion  4/5  Ankle plantarflexion    Ankle inversion    Ankle eversion     (Blank rows = not tested)    FUNCTIONAL TESTS:  5 times sit to stand: With UE assist 22.12  2 minute walk test: 40' with rolling walker                       Single leg raise:  unable.     TODAY'S TREATMENT: 04/16/22  STM and man ROM to left knee in supine and sitting to decrease pain and increase tissue mobility x 15' Manual knee extension and flexion in supine and manual  flexion in sitting  Supine: Left Quad sets 5" hold x 10 Left SLR x 10 Left Hip abduction x 10  Standing: Heel/toe raises 2 x 10 Mini squats 2 x 10 Knee drives on 8" box x 2' Left Hip abduction 2 x 10    Supine: AROM KF 93 degrees today   04/14/22 Quad  sets 10x 10 second hold SLR 2x 10  Heel slides with belt 10 x 5-10 second hold LAQ 10 x 5 second holds 2 sets  Seated heel slides 10 x 10 second holds Hamstring isometrics into green ball - supine 10 x 10 second holds HR/TR 1x 10    04/09/22:  Evaluation:  LAQ x 5 x 2 sets                 Ankle pumps x 10                Quad set x 10                Heel slide x 5 x 2 sets                SLR x 5 x 2 sets    PATIENT EDUCATION:  Education details: HEP Person educated: Patient Education method: Explanation, Verbal cues, and Handouts Education comprehension: verbalized understanding and returned demonstration   HOME EXERCISE PROGRAM: Access Code: [email or web address]  Date: 04/09/2022 - Seated Long Arc Quad  - 2 x daily - 7 x weekly - 1 sets - 10 reps - 3-5" hold - Supine Ankle Pumps  - 2 x daily - 7 x weekly - 1 sets - 10 reps - 3-5" hold - Long Sitting Quad Set  - 2 x daily - 7 x weekly - 1 sets - 10 reps - 3-5" hold - Supine Heel Slides  - 2 x daily - 7 x weekly - 1 sets - 10 reps - 3-5" hold - Active Straight Leg Raise with Quad Set  - 2 x daily - 7 x weekly - 1 sets - 10 reps - 3" hold  ASSESSMENT:  CLINICAL IMPRESSION: Today's session continued to address left knee mobility and strength. Increased complaint of soreness and pain today so STM to left knee today to decrease pain and improve tissue mobility; patient with significant bruising left leg but reports decreased pain after STM; 5/10 at the end of treatment.  Progressed to standing exercise today without issue. Patient needs verbal cues to improve posturing with standing and walking as she tends to forward flex at the trunk.  Patient will continue to benefit from physical therapy in order to improve function and reduce impairment.    OBJECTIVE IMPAIRMENTS Abnormal gait, decreased activity tolerance, decreased balance, decreased mobility, difficulty walking, decreased ROM, decreased strength, increased edema,  and pain.   ACTIVITY LIMITATIONS carrying, lifting, bending, sitting, standing, squatting, stairs, dressing, and locomotion level  PARTICIPATION LIMITATIONS: meal prep, cleaning, laundry, driving, shopping, community activity, and yard work  [facility] POTENTIAL: Good  CLINICAL DECISION MAKING: Stable/uncomplicated  EVALUATION COMPLEXITY: Low   GOALS: Goals reviewed with patient? Yes  SHORT TERM GOALS: Target date: 04/30/2022  PT to be I in HEP in order to increase her ROM to 5-90 Baseline: Goal status: INITIAL  2.  Pt strength to be increased by 1/2 grade to allow pt to feel confident walking with a cane. Baseline:  Goal status: INITIAL  3.  Pt to be able to sit for 30 minutes in comfort for car rides/ waiting at MD  Baseline:  Goal status: INITIAL  4.  PT pain level to be no greater than a 4/10 to be able to walk for 15 minutes. Baseline:  Goal status: INITIAL   LONG TERM GOALS: Target date: 05/21/2022   PT to be I in an advanced  HEP to allow pt ROM to be 0-115 to allow pt to have a normalized gt and squat down.  Baseline:  Goal status: INITIAL  2.  PT strength to be increased one grade to allow pt to rise from a squatted position without having to use her UE. Baseline:  Goal status: INITIAL  3.  Pt pain in Lt LE to  be no greater than a 2/10 to allow pt to be on her feet with no assistive device for an hour at a time for shopping/community events. Baseline:  Goal status: INITIAL  4.  PT to have improved power to be able to complete 5 sit to stand in 12 seconds or less Baseline:  Goal status: INITIAL  5.  PT to be able to walk 225 feet in two minutes or less with no assistive device.  Baseline:  Goal status: INITIAL   PLAN: PT FREQUENCY: 2x/week  PT DURATION: 6 weeks  PLANNED INTERVENTIONS: Therapeutic exercises, Therapeutic activity, Neuromuscular re-education, Balance training, Gait training, Patient/Family education, Self Care, and Manual therapy  PLAN  FOR NEXT SESSION: Progress ROM, strength and balance. Try SPC  1:44 PM, 04/16/22 Amyre Segundo Small Jamelia Varano MPT  physical therapy [place] [phone]

## 2022-04-20 DIAGNOSIS — F429 Obsessive-compulsive disorder, unspecified: Secondary | ICD-10-CM | POA: Diagnosis not present

## 2022-04-20 DIAGNOSIS — E782 Mixed hyperlipidemia: Secondary | ICD-10-CM | POA: Diagnosis not present

## 2022-04-20 DIAGNOSIS — Z0000 Encounter for general adult medical examination without abnormal findings: Secondary | ICD-10-CM | POA: Diagnosis not present

## 2022-04-20 DIAGNOSIS — I10 Essential (primary) hypertension: Secondary | ICD-10-CM | POA: Diagnosis not present

## 2022-04-20 DIAGNOSIS — D649 Anemia, unspecified: Secondary | ICD-10-CM | POA: Diagnosis not present

## 2022-04-20 DIAGNOSIS — R600 Localized edema: Secondary | ICD-10-CM | POA: Diagnosis not present

## 2022-04-20 DIAGNOSIS — M25562 Pain in left knee: Secondary | ICD-10-CM | POA: Diagnosis not present

## 2022-04-20 DIAGNOSIS — R7303 Prediabetes: Secondary | ICD-10-CM | POA: Diagnosis not present

## 2022-04-20 DIAGNOSIS — M25561 Pain in right knee: Secondary | ICD-10-CM | POA: Diagnosis not present

## 2022-04-21 ENCOUNTER — Ambulatory Visit (HOSPITAL_COMMUNITY): Payer: Medicare PPO | Attending: Orthopedic Surgery | Admitting: Physical Therapy

## 2022-04-21 ENCOUNTER — Encounter (HOSPITAL_COMMUNITY): Payer: Self-pay | Admitting: Physical Therapy

## 2022-04-21 DIAGNOSIS — M6281 Muscle weakness (generalized): Secondary | ICD-10-CM | POA: Diagnosis not present

## 2022-04-21 DIAGNOSIS — R262 Difficulty in walking, not elsewhere classified: Secondary | ICD-10-CM | POA: Diagnosis not present

## 2022-04-21 DIAGNOSIS — M25562 Pain in left knee: Secondary | ICD-10-CM | POA: Insufficient documentation

## 2022-04-21 DIAGNOSIS — M25662 Stiffness of left knee, not elsewhere classified: Secondary | ICD-10-CM | POA: Insufficient documentation

## 2022-04-21 NOTE — Therapy (Signed)
OUTPATIENT PHYSICAL THERAPY TREATMENT   Patient Name: Shawna Lewis MRN: [id number] DOB:03/10/1951, 71 y.o., female Today's Date: 04/21/2022  Progress Note   Reporting Period 04/09/22 to 04/21/22   See note below for Objective Data and Assessment of Progress/Goals    PT End of Session - 04/21/22 1308     Visit Number 4    Number of Visits 12    Date for PT Re-Evaluation 05/21/22    Authorization Type [employer] Choice;  auth required; no VL    Authorization Time Period 12 visits required 9/21- 05/21/22    Authorization - Visit Number 4    Authorization - Number of Visits 12    Progress Note Due on Visit 10    PT Start Time 1307    PT Stop Time 1345    PT Time Calculation (min) 38 min    Activity Tolerance Patient tolerated treatment well;No increased pain    Behavior During Therapy WFL for tasks assessed/performed              Past Medical History:  Diagnosis Date   Anemia    Arthritis    DDD (degenerative disc disease), lumbar    Family history of adverse reaction to anesthesia    father had pancreatic cancer received anesthesia with artery replacement and soon as received anesthesia had stroke never knew quite what happened per pt   Fibromyalgia    GERD (gastroesophageal reflux disease)    Heart murmur    Hypertension    Migraine    OCD (obsessive compulsive disorder)    Osteomyelitis (HCC)    Pre-diabetes    Past Surgical History:  Procedure Laterality Date   ANKLE FRACTURE SURGERY  2018   BALLOON DILATION  05/23/2021   Procedure: BALLOON DILATION;  Surgeon: Castaneda Mayorga, Daniel, MD;  Location: [facility];  Service: Gastroenterology;;  ascending colon strictures   BIOPSY  05/23/2021   Procedure: BIOPSY;  Surgeon: Castaneda Mayorga, Daniel, MD;  [facility]: [facility];  Service: Gastroenterology;;   BREAST BIOPSY  1995   CATARACT EXTRACTION     CESAREAN SECTION  1986   COLONOSCOPY WITH PROPOFOL N/A 05/23/2021   Procedure: COLONOSCOPY WITH  PROPOFOL;  Surgeon: Castaneda Mayorga, Daniel, MD;  [facility]: [facility];  Service: Gastroenterology;  Laterality: N/A;  10:10   ESOPHAGOGASTRODUODENOSCOPY (EGD) WITH PROPOFOL N/A 05/23/2021   Procedure: ESOPHAGOGASTRODUODENOSCOPY (EGD) WITH PROPOFOL;  Surgeon: Castaneda Mayorga, Daniel, MD;  [facility]: [facility];  Service: Gastroenterology;  Laterality: N/A;   IR LUMBAR DISC ASPIRATION W/IMG GUIDE  06/22/2019   KNEE ARTHROSCOPY W/ MENISCAL REPAIR     RADIOFREQUENCY ABLATION NERVES     TONSILLECTOMY  1974   TOTAL KNEE ARTHROPLASTY Right 12/09/2021   Procedure: TOTAL KNEE ARTHROPLASTY;  Surgeon: Murphy, Timothy D, MD;  Location: [facility];  Service: Orthopedics;  Laterality: Right;   TOTAL KNEE ARTHROPLASTY Left 04/07/2022   Procedure: LEFT TOTAL KNEE ARTHROPLASTY;  Surgeon: Murphy, Timothy D, MD;  Location: [facility];  Service: Orthopedics;  Laterality: Left;   Patient Active Problem List   Diagnosis Date Noted   S/P total knee arthroplasty, left 04/07/2022   S/P total knee arthroplasty, right 12/09/2021   Positive FIT (fecal immunochemical test) 05/14/2021   Anemia 05/14/2021   Therapeutic drug monitoring 06/30/2019   Chronic left lumbar radiculopathy 06/21/2019   Diskitis 06/20/2019   Osteomyelitis (HCC) 06/20/2019   HTN (hypertension) 06/20/2019   DDD (degenerative disc disease), lumbar    Migraine    Chronic   low back pain    Central stenosis of spinal canal     PCP: John Hall   REFERRING PROVIDER: Murphy, Timothy D, MD  REFERRING DIAG: s/p Lt TKR  THERAPY DIAG:  Lt knee stiffness Muscle weakness Difficulty in walking    Rationale for Evaluation and Treatment Rehabilitation  ONSET DATE: DATE OF SURGERY:  04/07/2022  SUBJECTIVE:   SUBJECTIVE STATEMENT: Pt states she is going good. Working on HEP. Been trying SPC at home. Follows up with MD tomorrow. Patient states 80% improvement since beginning PT.   PERTINENT HISTORY: Rt TKR, chronic LBP  PAIN:  Are you having  pain? Yes: NPRS scale: 4/10 Pain location: Lt knee  Pain description: throbbing  Aggravating factors: bending weight bearing  Relieving factors: rest  PRECAUTIONS: None  WEIGHT BEARING RESTRICTIONS No  FALLS:  Has patient fallen in last 6 months? No  LIVING ENVIRONMENT: Lives with: lives with their family Lives in: House/apartment Stairs: Yes: External: 5 steps; on right going up Has following equipment at home: Single point cane, shower chair, and bed side commode  OCCUPATION: retired   PLOF: Independent  PATIENT GOALS To get her knee working well   OBJECTIVE: (objective measures from evaluation unless otherwise dated)  PATIENT SURVEYS:  FOTO 43  COGNITION:  Overall cognitive status: Within functional limits for tasks assessed       EDEMA:  Circumferential: LT: 191/2"     POSTURE: No Significant postural limitations   LOWER EXTREMITY ROM:  Active ROM Right eval Left eval 04/14/22 Left Left 04/21/22  Hip flexion      Hip extension      Hip abduction      Hip adduction      Hip internal rotation      Hip external rotation      Knee flexion  70 80 102  Knee extension 0 12 3 Lacking 4  Ankle dorsiflexion      Ankle plantarflexion      Ankle inversion      Ankle eversion       (Blank rows = not tested)  LOWER EXTREMITY MMT:  MMT Right eval Left eval Left 04/21/22  Hip flexion     Hip extension     Hip abduction     Hip adduction     Hip internal rotation     Hip external rotation     Knee flexion  3/5 5  Knee extension  3/5 4+  Ankle dorsiflexion  4/5 5  Ankle plantarflexion     Ankle inversion     Ankle eversion      (Blank rows = not tested)    FUNCTIONAL TESTS:  5 times sit to stand: With UE assist 22.12  2 minute walk test: 70' with rolling walker                       Single leg raise:  unable.    Reassessment 04/21/22 5 times sit to stand: With UE assist 15.39 seconds 2 minute walk test: 226 feet with SPC   TODAY'S  TREATMENT: 04/21/22 Bike 5 minutes for dynamic warm up and ROM - seat 8 Reassessment HR x 20 Mini squats 2 x 10 Step up 4 inch 1x 15, 6 inch 2x 10  Knee drives on 12 inch step 2x 10 x 5-10 second holds  04/16/22  STM and man ROM to left knee in supine and sitting to decrease pain and increase tissue mobility x 15' Manual   knee extension and flexion in supine and manual  flexion in sitting  Supine: Left Quad sets 5" hold x 10 Left SLR x 10 Left Hip abduction x 10  Standing: Heel/toe raises 2 x 10 Mini squats 2 x 10 Knee drives on 8" box x 2' Left Hip abduction 2 x 10    Supine: AROM KF 93 degrees today   04/14/22 Quad sets 10x 10 second hold SLR 2x 10  Heel slides with belt 10 x 5-10 second hold LAQ 10 x 5 second holds 2 sets  Seated heel slides 10 x 10 second holds Hamstring isometrics into green ball - supine 10 x 10 second holds HR/TR 1x 10      PATIENT EDUCATION:  Education details: 10/3 HEP, reassessment findings Person educated: Patient Education method: Explanation, Verbal cues, and Handouts Education comprehension: verbalized understanding and returned demonstration   HOME EXERCISE PROGRAM: Access Code: [email or web address]  Date: 04/09/2022 - Seated Long Arc Quad  - 2 x daily - 7 x weekly - 1 sets - 10 reps - 3-5" hold - Supine Ankle Pumps  - 2 x daily - 7 x weekly - 1 sets - 10 reps - 3-5" hold - Long Sitting Quad Set  - 2 x daily - 7 x weekly - 1 sets - 10 reps - 3-5" hold - Supine Heel Slides  - 2 x daily - 7 x weekly - 1 sets - 10 reps - 3-5" hold - Active Straight Leg Raise with Quad Set  - 2 x daily - 7 x weekly - 1 sets - 10 reps - 3" hold  ASSESSMENT:  CLINICAL IMPRESSION: Patient making excellent progress since beginning PT. Patient able to complete full oscillations on bike today for dynamic warm up. Patient with improving ROM from lacking 4 to 102. She has met 3/4 short term goals and 2/5 long term goals with ability to complete HEP and improvement in  strength, ROM, gait, activity tolerance and functional mobility. She remains limited with strength, end range ROM, activity tolerance and functional mobility. Continued with standing exercises which are tolerated well. Patient will continue to benefit from physical therapy in order to improve function and reduce impairment.    OBJECTIVE IMPAIRMENTS Abnormal gait, decreased activity tolerance, decreased balance, decreased mobility, difficulty walking, decreased ROM, decreased strength, increased edema, and pain.   ACTIVITY LIMITATIONS carrying, lifting, bending, sitting, standing, squatting, stairs, dressing, and locomotion level  PARTICIPATION LIMITATIONS: meal prep, cleaning, laundry, driving, shopping, community activity, and yard work  [employer] POTENTIAL: Good  CLINICAL DECISION MAKING: Stable/uncomplicated  EVALUATION COMPLEXITY: Low   GOALS: Goals reviewed with patient? Yes  SHORT TERM GOALS: Target date: 04/30/2022  PT to be I in HEP in order to increase her ROM to 5-90 Baseline: Goal status: MET  2.  Pt strength to be increased by 1/2 grade to allow pt to feel confident walking with a cane. Baseline:  Goal status: MET  3.  Pt to be able to sit for 30 minutes in comfort for car rides/ waiting at MD  Baseline:  Goal status: MET  4.  PT pain level to be no greater than a 4/10 to be able to walk for 15 minutes. Baseline:  Goal status: IN PROGRESS   LONG TERM GOALS: Target date: 05/21/2022   PT to be I in an advanced  HEP to allow pt ROM to be 0-115 to allow pt to have a normalized gt and squat down.  Baseline:  Goal status: IN PROGRESS  2.  PT strength to be increased one grade to allow pt to rise from a squatted position without having to use her UE. Baseline:  Goal status: MET  3.  Pt pain in Lt LE to be no greater than a 2/10 to allow pt to be on her feet with no assistive device for an hour at a time for shopping/community events. Baseline:  Goal status: IN  PROGRESS  4.  PT to have improved power to be able to complete 5 sit to stand in 12 seconds or less Baseline:  Goal status: IN PROGRESS  5.  PT to be able to walk 225 feet in two minutes or less with no assistive device.  Baseline:  Goal status: MET   PLAN: PT FREQUENCY: 2x/week  PT DURATION: 6 weeks  PLANNED INTERVENTIONS: Therapeutic exercises, Therapeutic activity, Neuromuscular re-education, Balance training, Gait training, Patient/Family education, Self Care, and Manual therapy  PLAN FOR NEXT SESSION: Progress ROM, strength and balance  1:42 PM, 04/21/22 Andrew S. Zaunegger PT, DPT Physical Therapist at [facility]  

## 2022-04-22 DIAGNOSIS — M1712 Unilateral primary osteoarthritis, left knee: Secondary | ICD-10-CM | POA: Diagnosis not present

## 2022-04-23 ENCOUNTER — Ambulatory Visit (HOSPITAL_COMMUNITY): Payer: Medicare PPO | Admitting: Physical Therapy

## 2022-04-23 ENCOUNTER — Encounter (HOSPITAL_COMMUNITY): Payer: Self-pay | Admitting: Physical Therapy

## 2022-04-23 DIAGNOSIS — M25562 Pain in left knee: Secondary | ICD-10-CM | POA: Diagnosis not present

## 2022-04-23 DIAGNOSIS — M25662 Stiffness of left knee, not elsewhere classified: Secondary | ICD-10-CM | POA: Diagnosis not present

## 2022-04-23 DIAGNOSIS — M6281 Muscle weakness (generalized): Secondary | ICD-10-CM | POA: Diagnosis not present

## 2022-04-23 DIAGNOSIS — R262 Difficulty in walking, not elsewhere classified: Secondary | ICD-10-CM | POA: Diagnosis not present

## 2022-04-23 NOTE — Therapy (Signed)
OUTPATIENT PHYSICAL THERAPY TREATMENT   Patient Name: Shawna Lewis MRN: [id number] DOB:17-Nov-1950, 71 y.o., female Today's Date: 04/23/2022  Progress Note   Reporting Period 04/09/22 to 04/21/22   See note below for Objective Data and Assessment of Progress/Goals    PT End of Session - 04/23/22 1301     Visit Number 5    Number of Visits 12    Date for PT Re-Evaluation 05/21/22    Authorization Type [employer];  auth required; no VL    Authorization Time Period 12 visits required 9/21- 05/21/22    Authorization - Visit Number 5    Authorization - Number of Visits 12    Progress Note Due on Visit 14    PT Start Time 1302    PT Stop Time 1340    PT Time Calculation (min) 38 min    Activity Tolerance Patient tolerated treatment well;No increased pain    Behavior During Therapy WFL for tasks assessed/performed              Past Medical History:  Diagnosis Date   Anemia    Arthritis    DDD (degenerative disc disease), lumbar    Family history of adverse reaction to anesthesia    father had pancreatic cancer received anesthesia with artery replacement and soon as received anesthesia had stroke never knew quite what happened per pt   Fibromyalgia    GERD (gastroesophageal reflux disease)    Heart murmur    Hypertension    Migraine    OCD (obsessive compulsive disorder)    Osteomyelitis ([place])    Pre-diabetes    Past Surgical History:  Procedure Laterality Date   ANKLE FRACTURE SURGERY  2018   BALLOON DILATION  05/23/2021   Procedure: BALLOON DILATION;  Surgeon: Harvel Quale, MD;  [facility];  Service: Gastroenterology;;  ascending colon strictures   BIOPSY  05/23/2021   Procedure: BIOPSY;  Surgeon: Harvel Quale, MD;  [facility]: [facility];  Service: Gastroenterology;;   BREAST BIOPSY  1995   CATARACT EXTRACTION     [place]   COLONOSCOPY WITH PROPOFOL N/A 05/23/2021   Procedure: COLONOSCOPY WITH  PROPOFOL;  Surgeon: Harvel Quale, MD;  [facility];  Service: Gastroenterology;  Laterality: N/A;  10:10   ESOPHAGOGASTRODUODENOSCOPY (EGD) WITH PROPOFOL N/A 05/23/2021   Procedure: ESOPHAGOGASTRODUODENOSCOPY (EGD) WITH PROPOFOL;  Surgeon: Harvel Quale, MD;  [facility];  Service: Gastroenterology;  Laterality: N/A;   IR LUMBAR DISC ASPIRATION W/IMG GUIDE  06/22/2019   KNEE ARTHROSCOPY W/ MENISCAL REPAIR     RADIOFREQUENCY ABLATION NERVES     TONSILLECTOMY  1974   TOTAL KNEE ARTHROPLASTY Right 12/09/2021   Procedure: TOTAL KNEE ARTHROPLASTY;  Surgeon: Renette Butters, MD;  Location: [facility];  Service: Orthopedics;  Laterality: Right;   TOTAL KNEE ARTHROPLASTY Left 04/07/2022   Procedure: LEFT TOTAL KNEE ARTHROPLASTY;  Surgeon: Renette Butters, MD;  Location: [facility];  Service: Orthopedics;  Laterality: Left;   Patient Active Problem List   Diagnosis Date Noted   S/P total knee arthroplasty, left 04/07/2022   S/P total knee arthroplasty, right 12/09/2021   Positive FIT (fecal immunochemical test) 05/14/2021   Anemia 05/14/2021   Therapeutic drug monitoring 06/30/2019   Chronic left lumbar radiculopathy 06/21/2019   Diskitis 06/20/2019   Osteomyelitis ([place]) 06/20/2019   HTN (hypertension) 06/20/2019   DDD (degenerative disc disease), lumbar    Migraine    Chronic  low back pain    Central stenosis of spinal canal     PCP: Allyn Kenner   REFERRING PROVIDER: Renette Butters, MD  REFERRING DIAG: s/p Lt TKR  THERAPY DIAG:  Lt knee stiffness Muscle weakness Difficulty in walking    Rationale for Evaluation and Treatment Rehabilitation  ONSET DATE: DATE OF SURGERY:  04/07/2022  SUBJECTIVE:   SUBJECTIVE STATEMENT: Pt states her BP has been up with meds at about 176/81.   PERTINENT HISTORY: Rt TKR, chronic LBP  PAIN:  Are you having pain? Yes: NPRS scale: 3/10 Pain location: Lt knee  Pain description: throbbing  Aggravating  factors: bending weight bearing  Relieving factors: rest  PRECAUTIONS: None  WEIGHT BEARING RESTRICTIONS No  FALLS:  Has patient fallen in last 6 months? No  LIVING ENVIRONMENT: Lives with: lives with their family Lives in: House/apartment Stairs: Yes: External: 5 steps; on right going up Has following equipment at home: Single point cane, shower chair, and bed side commode  OCCUPATION: retired   PLOF: Independent  PATIENT GOALS To get her knee working well   OBJECTIVE: (objective measures from evaluation unless otherwise dated)  PATIENT SURVEYS:  FOTO 78  COGNITION:  Overall cognitive status: Within functional limits for tasks assessed       EDEMA:  Circumferential: LT: 191/2"     POSTURE: No Significant postural limitations   LOWER EXTREMITY ROM:  Active ROM Right eval Left eval 04/14/22 Left Left 04/21/22 Left 04/23/22  Hip flexion       Hip extension       Hip abduction       Hip adduction       Hip internal rotation       Hip external rotation       Knee flexion  70 80 102 103  Knee extension 0 12 3 Lacking 4 Lacking 3  Ankle dorsiflexion       Ankle plantarflexion       Ankle inversion       Ankle eversion        (Blank rows = not tested)  LOWER EXTREMITY MMT:  MMT Right eval Left eval Left 04/21/22  Hip flexion     Hip extension     Hip abduction     Hip adduction     Hip internal rotation     Hip external rotation     Knee flexion  3/5 5  Knee extension  3/5 4+  Ankle dorsiflexion  4/5 5  Ankle plantarflexion     Ankle inversion     Ankle eversion      (Blank rows = not tested)    FUNCTIONAL TESTS:  5 times sit to stand: With UE assist 22.12  2 minute walk test: 56' with rolling walker                       Single leg raise:  unable.    Reassessment 04/21/22 5 times sit to stand: With UE assist 15.39 seconds 2 minute walk test: 226 feet with SPC   TODAY'S TREATMENT: 04/23/22 SLR 3x 10 LLE Bike 5 minutes for dynamic  warm up and ROM - seat 8 Step up 6 inch 2x 10  Lateral step up 6 inch 2x 10 STS 2x 10 without UE support Lateral stepping 6x 10 feet bilateral   04/21/22 Bike 5 minutes for dynamic warm up and ROM - seat 8 Reassessment HR x 20 Mini squats 2 x 10 Step  up 4 inch 1x 15, 6 inch 2x 10  Knee drives on 12 inch step 2x 10 x 5-10 second holds    PATIENT EDUCATION:  Education details: 10/5 HEP; 10/3 HEP, reassessment findings Person educated: Patient Education method: Explanation, Verbal cues, and Handouts Education comprehension: verbalized understanding and returned demonstration   HOME EXERCISE PROGRAM: Access Code: [email or web address] 04/23/22 - Sit to Stand with Arms Crossed  - 2 x daily - 7 x weekly - 2 sets - 10 reps  Date: 04/09/2022 - Seated Long Arc Quad  - 2 x daily - 7 x weekly - 1 sets - 10 reps - 3-5" hold - Supine Ankle Pumps  - 2 x daily - 7 x weekly - 1 sets - 10 reps - 3-5" hold - Long Sitting Quad Set  - 2 x daily - 7 x weekly - 1 sets - 10 reps - 3-5" hold - Supine Heel Slides  - 2 x daily - 7 x weekly - 1 sets - 10 reps - 3-5" hold - Active Straight Leg Raise with Quad Set  - 2 x daily - 7 x weekly - 1 sets - 10 reps - 3" hold  ASSESSMENT:  CLINICAL IMPRESSION: Patient without BP symptoms throughout session. Patient with BP at 154/82 which is lower than what it has been lately when checked at home. Patient demonstrating slightly improving ROM. Continues to tolerate strengthening exercises well. Able to complete STS without UE support. Patient will continue to benefit from physical therapy in order to improve function and reduce impairment.    OBJECTIVE IMPAIRMENTS Abnormal gait, decreased activity tolerance, decreased balance, decreased mobility, difficulty walking, decreased ROM, decreased strength, increased edema, and pain.   ACTIVITY LIMITATIONS carrying, lifting, bending, sitting, standing, squatting, stairs, dressing, and locomotion level  PARTICIPATION LIMITATIONS:  meal prep, cleaning, laundry, driving, shopping, community activity, and yard work  [employer] POTENTIAL: Good  CLINICAL DECISION MAKING: Stable/uncomplicated  EVALUATION COMPLEXITY: Low   GOALS: Goals reviewed with patient? Yes  SHORT TERM GOALS: Target date: 04/30/2022  PT to be I in HEP in order to increase her ROM to 5-90 Baseline: Goal status: MET  2.  Pt strength to be increased by 1/2 grade to allow pt to feel confident walking with a cane. Baseline:  Goal status: MET  3.  Pt to be able to sit for 30 minutes in comfort for car rides/ waiting at MD  Baseline:  Goal status: MET  4.  PT pain level to be no greater than a 4/10 to be able to walk for 15 minutes. Baseline:  Goal status: IN PROGRESS   LONG TERM GOALS: Target date: 05/21/2022   PT to be I in an advanced  HEP to allow pt ROM to be 0-115 to allow pt to have a normalized gt and squat down.  Baseline:  Goal status: IN PROGRESS  2.  PT strength to be increased one grade to allow pt to rise from a squatted position without having to use her UE. Baseline:  Goal status: MET  3.  Pt pain in Lt LE to be no greater than a 2/10 to allow pt to be on her feet with no assistive device for an hour at a time for shopping/community events. Baseline:  Goal status: IN PROGRESS  4.  PT to have improved power to be able to complete 5 sit to stand in 12 seconds or less Baseline:  Goal status: IN PROGRESS  5.  PT to be able to walk 225  feet in two minutes or less with no assistive device.  Baseline:  Goal status: MET   PLAN: PT FREQUENCY: 2x/week  PT DURATION: 6 weeks  PLANNED INTERVENTIONS: Therapeutic exercises, Therapeutic activity, Neuromuscular re-education, Balance training, Gait training, Patient/Family education, Self Care, and Manual therapy  PLAN FOR NEXT SESSION: Progress ROM, strength and balance  1:03 PM, 04/23/22 Mearl Latin PT, DPT Physical Therapist at [facility]

## 2022-04-28 ENCOUNTER — Ambulatory Visit (HOSPITAL_COMMUNITY): Payer: Medicare PPO

## 2022-04-28 ENCOUNTER — Encounter (HOSPITAL_COMMUNITY): Payer: Self-pay

## 2022-04-28 DIAGNOSIS — M25662 Stiffness of left knee, not elsewhere classified: Secondary | ICD-10-CM

## 2022-04-28 DIAGNOSIS — M6281 Muscle weakness (generalized): Secondary | ICD-10-CM | POA: Diagnosis not present

## 2022-04-28 DIAGNOSIS — R262 Difficulty in walking, not elsewhere classified: Secondary | ICD-10-CM

## 2022-04-28 DIAGNOSIS — M25562 Pain in left knee: Secondary | ICD-10-CM | POA: Diagnosis not present

## 2022-04-28 NOTE — Therapy (Signed)
OUTPATIENT PHYSICAL THERAPY TREATMENT   Patient Name: Shawna Lewis MRN: [id number] DOB:10/03/1950, 71 y.o., female Today's Date: 04/28/2022   PT End of Session - 04/28/22 1345     Visit Number 6    Number of Visits 12    Authorization Type [employer] Choice;  auth required; no VL    Authorization Time Period 12 visits required 9/21- 05/21/22    Authorization - Visit Number 6    Authorization - Number of Visits 12    Progress Note Due on Visit 14    PT Start Time 1302    PT Stop Time 1340    PT Time Calculation (min) 38 min    Activity Tolerance Patient tolerated treatment well;No increased pain    Behavior During Therapy WFL for tasks assessed/performed               Past Medical History:  Diagnosis Date   Anemia    Arthritis    DDD (degenerative disc disease), lumbar    Family history of adverse reaction to anesthesia    father had pancreatic cancer received anesthesia with artery replacement and soon as received anesthesia had stroke never knew quite what happened per pt   Fibromyalgia    GERD (gastroesophageal reflux disease)    Heart murmur    Hypertension    Migraine    OCD (obsessive compulsive disorder)    Osteomyelitis ([place])    Pre-diabetes    Past Surgical History:  Procedure Laterality Date   ANKLE FRACTURE SURGERY  2018   BALLOON DILATION  05/23/2021   Procedure: BALLOON DILATION;  Surgeon: Harvel Quale, MD;  [facility];  Service: Gastroenterology;;  ascending colon strictures   BIOPSY  05/23/2021   Procedure: BIOPSY;  Surgeon: Harvel Quale, MD;  [facility];  Service: Gastroenterology;;   BREAST BIOPSY  1995   CATARACT EXTRACTION     [place]   COLONOSCOPY WITH PROPOFOL N/A 05/23/2021   Procedure: COLONOSCOPY WITH PROPOFOL;  Surgeon: Harvel Quale, MD;  [facility];  Service: Gastroenterology;  Laterality: N/A;  10:10   ESOPHAGOGASTRODUODENOSCOPY (EGD)  WITH PROPOFOL N/A 05/23/2021   Procedure: ESOPHAGOGASTRODUODENOSCOPY (EGD) WITH PROPOFOL;  Surgeon: Harvel Quale, MD;  [facility];  Service: Gastroenterology;  Laterality: N/A;   IR LUMBAR DISC ASPIRATION W/IMG GUIDE  06/22/2019   KNEE ARTHROSCOPY W/ MENISCAL REPAIR     RADIOFREQUENCY ABLATION NERVES     TONSILLECTOMY  1974   TOTAL KNEE ARTHROPLASTY Right 12/09/2021   Procedure: TOTAL KNEE ARTHROPLASTY;  Surgeon: Renette Butters, MD;  [facility];  Service: Orthopedics;  Laterality: Right;   TOTAL KNEE ARTHROPLASTY Left 04/07/2022   Procedure: LEFT TOTAL KNEE ARTHROPLASTY;  Surgeon: Renette Butters, MD;  Location[facility];  Service: Orthopedics;  Laterality: Left;   Patient Active Problem List   Diagnosis Date Noted   S/P total knee arthroplasty, left 04/07/2022   S/P total knee arthroplasty, right 12/09/2021   Positive FIT (fecal immunochemical test) 05/14/2021   Anemia 05/14/2021   Therapeutic drug monitoring 06/30/2019   Chronic left lumbar radiculopathy 06/21/2019   Diskitis 06/20/2019   Osteomyelitis ([place]) 06/20/2019   HTN (hypertension) 06/20/2019   DDD (degenerative disc disease), lumbar    Migraine    Chronic low back pain    Central stenosis of spinal canal     PCP: Allyn Kenner   REFERRING PROVIDER: Renette Butters, MD  REFERRING DIAG: s/p  Lt TKR  THERAPY DIAG:  Lt knee stiffness Muscle weakness Difficulty in walking    Rationale for Evaluation and Treatment Rehabilitation  ONSET DATE: DATE OF SURGERY:  04/07/2022  SUBJECTIVE:   SUBJECTIVE STATEMENT: Pt stated her knee is feeling good considering it's been 3 weeks since surgery.  Stated she has new BP medication and has been 145/69 mmHg.  Pain scale 3/10 sore and stiffness.  Has been compliant with HEP daily.    PERTINENT HISTORY: Rt TKR, chronic LBP  PAIN:  Are you having pain? Yes: NPRS scale: 3/10 Pain location: Lt knee  Pain description: throbbing  Aggravating factors:  bending weight bearing  Relieving factors: rest  PRECAUTIONS: None  WEIGHT BEARING RESTRICTIONS No  FALLS:  Has patient fallen in last 6 months? No  LIVING ENVIRONMENT: Lives with: lives with their family Lives in: House/apartment Stairs: Yes: External: 5 steps; on right going up Has following equipment at home: Single point cane, shower chair, and bed side commode  OCCUPATION: retired   PLOF: Independent  PATIENT GOALS To get her knee working well   OBJECTIVE: (objective measures from evaluation unless otherwise dated)  PATIENT SURVEYS:  FOTO 53  COGNITION:  Overall cognitive status: Within functional limits for tasks assessed       EDEMA:  Circumferential: LT: 191/2"     POSTURE: No Significant postural limitations   LOWER EXTREMITY ROM:  Active ROM Right eval Left eval 04/14/22 Left Left 04/21/22 Left 04/23/22 Left  10/10  Hip flexion        Hip extension        Hip abduction        Hip adduction        Hip internal rotation        Hip external rotation        Knee flexion  70 80 102 103 106  Knee extension 0 12 3 Lacking 4 Lacking 3 Lacking 1  Ankle dorsiflexion        Ankle plantarflexion        Ankle inversion        Ankle eversion         (Blank rows = not tested)  LOWER EXTREMITY MMT:  MMT Right eval Left eval Left 04/21/22  Hip flexion     Hip extension     Hip abduction     Hip adduction     Hip internal rotation     Hip external rotation     Knee flexion  3/5 5  Knee extension  3/5 4+  Ankle dorsiflexion  4/5 5  Ankle plantarflexion     Ankle inversion     Ankle eversion      (Blank rows = not tested)    FUNCTIONAL TESTS:  5 times sit to stand: With UE assist 22.12  2 minute walk test: 20' with rolling walker                       Single leg raise:  unable.    Reassessment 04/21/22 5 times sit to stand: With UE assist 15.39 seconds 2 minute walk test: 226 feet with SPC   TODAY'S TREATMENT: 10/10: Bike 5' for  dynamic warm up and ROM full revolution Knee drive on 67HA 10 19-37" holds Heel raise incline slope 20x Toe raises decline slope 20x TKE 15x 5" with RTB Minisquat 10x  Seated: Controlled STS no HHA equal weight bearing 10x Heel slide to end range then AAROM for  flexion 10x 10" LAQ  Supine: Heel slide AROM 1-106, AAROM 110 flexion with rope  04/23/22 SLR 3x 10 LLE Bike 5 minutes for dynamic warm up and ROM - seat 8 Step up 6 inch 2x 10  Lateral step up 6 inch 2x 10 STS 2x 10 without UE support Lateral stepping 6x 10 feet bilateral   04/21/22 Bike 5 minutes for dynamic warm up and ROM - seat 8 Reassessment HR x 20 Mini squats 2 x 10 Step up 4 inch 1x 15, 6 inch 2x 10  Knee drives on 12 inch step 2x 10 x 5-10 second holds    PATIENT EDUCATION:  Education details: 10/5 HEP; 10/3 HEP, reassessment findings Person educated: Patient Education method: Explanation, Verbal cues, and Handouts Education comprehension: verbalized understanding and returned demonstration   HOME EXERCISE PROGRAM: Access Code: [email or web address] 04/23/22 - Sit to Stand with Arms Crossed  - 2 x daily - 7 x weekly - 2 sets - 10 reps  Date: 04/09/2022 - Seated Long Arc Quad  - 2 x daily - 7 x weekly - 1 sets - 10 reps - 3-5" hold - Supine Ankle Pumps  - 2 x daily - 7 x weekly - 1 sets - 10 reps - 3-5" hold - Long Sitting Quad Set  - 2 x daily - 7 x weekly - 1 sets - 10 reps - 3-5" hold - Supine Heel Slides  - 2 x daily - 7 x weekly - 1 sets - 10 reps - 3-5" hold - Active Straight Leg Raise with Quad Set  - 2 x daily - 7 x weekly - 1 sets - 10 reps - 3" hold  ASSESSMENT:  CLINICAL IMPRESSION: Session focus with knee mobility and strengthening.  Added standing TKE with theraband resistance for quad strengthening and AAROM seated heel slides for mobility.  Pt tolerated well to session with no reports of increased pain.  Improved ROM 1-106, AAROM to 110 with rope assistance.   OBJECTIVE IMPAIRMENTS Abnormal  gait, decreased activity tolerance, decreased balance, decreased mobility, difficulty walking, decreased ROM, decreased strength, increased edema, and pain.   ACTIVITY LIMITATIONS carrying, lifting, bending, sitting, standing, squatting, stairs, dressing, and locomotion level  PARTICIPATION LIMITATIONS: meal prep, cleaning, laundry, driving, shopping, community activity, and yard work  [employer] POTENTIAL: Good  CLINICAL DECISION MAKING: Stable/uncomplicated  EVALUATION COMPLEXITY: Low   GOALS: Goals reviewed with patient? Yes  SHORT TERM GOALS: Target date: 04/30/2022  PT to be I in HEP in order to increase her ROM to 5-90 Baseline: Goal status: MET  2.  Pt strength to be increased by 1/2 grade to allow pt to feel confident walking with a cane. Baseline:  Goal status: MET  3.  Pt to be able to sit for 30 minutes in comfort for car rides/ waiting at MD  Baseline:  Goal status: MET  4.  PT pain level to be no greater than a 4/10 to be able to walk for 15 minutes. Baseline:  Goal status: IN PROGRESS   LONG TERM GOALS: Target date: 05/21/2022   PT to be I in an advanced  HEP to allow pt ROM to be 0-115 to allow pt to have a normalized gt and squat down.  Baseline:  Goal status: IN PROGRESS  2.  PT strength to be increased one grade to allow pt to rise from a squatted position without having to use her UE. Baseline:  Goal status: MET  3.  Pt pain in Lt LE to  be no greater than a 2/10 to allow pt to be on her feet with no assistive device for an hour at a time for shopping/community events. Baseline:  Goal status: IN PROGRESS  4.  PT to have improved power to be able to complete 5 sit to stand in 12 seconds or less Baseline:  Goal status: IN PROGRESS  5.  PT to be able to walk 225 feet in two minutes or less with no assistive device.  Baseline:  Goal status: MET   PLAN: PT FREQUENCY: 2x/week  PT DURATION: 6 weeks  PLANNED INTERVENTIONS: Therapeutic exercises,  Therapeutic activity, Neuromuscular re-education, Balance training, Gait training, Patient/Family education, Self Care, and Manual therapy  PLAN FOR NEXT SESSION: Progress ROM, strength and balance  Ihor Austin, LPTA/CLT; CBIS [phone] 1:46 PM, 04/28/22

## 2022-04-30 ENCOUNTER — Ambulatory Visit (HOSPITAL_COMMUNITY): Payer: Medicare PPO | Admitting: Physical Therapy

## 2022-04-30 ENCOUNTER — Encounter (HOSPITAL_COMMUNITY): Payer: Self-pay | Admitting: Physical Therapy

## 2022-04-30 DIAGNOSIS — R262 Difficulty in walking, not elsewhere classified: Secondary | ICD-10-CM

## 2022-04-30 DIAGNOSIS — M25662 Stiffness of left knee, not elsewhere classified: Secondary | ICD-10-CM | POA: Diagnosis not present

## 2022-04-30 DIAGNOSIS — M25562 Pain in left knee: Secondary | ICD-10-CM

## 2022-04-30 DIAGNOSIS — M6281 Muscle weakness (generalized): Secondary | ICD-10-CM

## 2022-04-30 NOTE — Therapy (Signed)
OUTPATIENT PHYSICAL THERAPY TREATMENT   Patient Name: Shawna Lewis MRN: [id number] DOB:09/13/1950, 71 y.o., female Today's Date: 04/30/2022   PT End of Session - 04/30/22 1303     Visit Number 7    Number of Visits 12    Date for PT Re-Evaluation 05/21/22    Authorization Type [employer] Choice;  auth required; no VL    Authorization Time Period 12 visits required 9/21- 05/21/22    Authorization - Visit Number 7    Authorization - Number of Visits 12    Progress Note Due on Visit 14    PT Start Time 4132    PT Stop Time 1341    PT Time Calculation (min) 38 min    Activity Tolerance Patient tolerated treatment well;No increased pain    Behavior During Therapy WFL for tasks assessed/performed               Past Medical History:  Diagnosis Date   Anemia    Arthritis    DDD (degenerative disc disease), lumbar    Family history of adverse reaction to anesthesia    father had pancreatic cancer received anesthesia with artery replacement and soon as received anesthesia had stroke never knew quite what happened per pt   Fibromyalgia    GERD (gastroesophageal reflux disease)    Heart murmur    Hypertension    Migraine    OCD (obsessive compulsive disorder)    Osteomyelitis ([place])    Pre-diabetes    Past Surgical History:  Procedure Laterality Date   ANKLE FRACTURE SURGERY  2018   BALLOON DILATION  05/23/2021   Procedure: BALLOON DILATION;  Surgeon: Harvel Quale, MD;  [facility];  Service: Gastroenterology;;  ascending colon strictures   BIOPSY  05/23/2021   Procedure: BIOPSY;  Surgeon: Harvel Quale, MD;  [facility];  Service: Gastroenterology;;   BREAST BIOPSY  1995   CATARACT EXTRACTION     [place]   COLONOSCOPY WITH PROPOFOL N/A 05/23/2021   Procedure: COLONOSCOPY WITH PROPOFOL;  Surgeon: Harvel Quale, MD;  [facility];  Service: Gastroenterology;  Laterality: N/A;   10:10   ESOPHAGOGASTRODUODENOSCOPY (EGD) WITH PROPOFOL N/A 05/23/2021   Procedure: ESOPHAGOGASTRODUODENOSCOPY (EGD) WITH PROPOFOL;  Surgeon: Harvel Quale, MD;  [facility];  Service: Gastroenterology;  Laterality: N/A;   IR LUMBAR DISC ASPIRATION W/IMG GUIDE  06/22/2019   KNEE ARTHROSCOPY W/ MENISCAL REPAIR     RADIOFREQUENCY ABLATION NERVES     TONSILLECTOMY  1974   TOTAL KNEE ARTHROPLASTY Right 12/09/2021   Procedure: TOTAL KNEE ARTHROPLASTY;  Surgeon: Renette Butters, MD;  Location: [facility];  Service: Orthopedics;  Laterality: Right;   TOTAL KNEE ARTHROPLASTY Left 04/07/2022   Procedure: LEFT TOTAL KNEE ARTHROPLASTY;  Surgeon: Renette Butters, MD;  Location: [facility];  Service: Orthopedics;  Laterality: Left;   Patient Active Problem List   Diagnosis Date Noted   S/P total knee arthroplasty, left 04/07/2022   S/P total knee arthroplasty, right 12/09/2021   Positive FIT (fecal immunochemical test) 05/14/2021   Anemia 05/14/2021   Therapeutic drug monitoring 06/30/2019   Chronic left lumbar radiculopathy 06/21/2019   Diskitis 06/20/2019   Osteomyelitis ([place]) 06/20/2019   HTN (hypertension) 06/20/2019   DDD (degenerative disc disease), lumbar    Migraine    Chronic low back pain    Central stenosis of spinal canal     PCP: Allyn Kenner   REFERRING PROVIDER:  Renette Butters, MD  REFERRING DIAG: s/p Lt TKR  THERAPY DIAG:  Lt knee stiffness Muscle weakness Difficulty in walking    Rationale for Evaluation and Treatment Rehabilitation  ONSET DATE: DATE OF SURGERY:  04/07/2022  SUBJECTIVE:   SUBJECTIVE STATEMENT: Pt stated she was able to bend her knee well the other day.   PERTINENT HISTORY: Rt TKR, chronic LBP  PAIN:  Are you having pain? Yes: NPRS scale: 3/10 Pain location: Lt knee  Pain description: throbbing  Aggravating factors: bending weight bearing  Relieving factors: rest  PRECAUTIONS: None  WEIGHT BEARING RESTRICTIONS  No  FALLS:  Has patient fallen in last 6 months? No  LIVING ENVIRONMENT: Lives with: lives with their family Lives in: House/apartment Stairs: Yes: External: 5 steps; on right going up Has following equipment at home: Single point cane, shower chair, and bed side commode  OCCUPATION: retired   PLOF: Independent  PATIENT GOALS To get her knee working well   OBJECTIVE: (objective measures from evaluation unless otherwise dated)  PATIENT SURVEYS:  FOTO 81  COGNITION:  Overall cognitive status: Within functional limits for tasks assessed       EDEMA:  Circumferential: LT: 191/2"     POSTURE: No Significant postural limitations   LOWER EXTREMITY ROM:  Active ROM Right eval Left eval 04/14/22 Left Left 04/21/22 Left 04/23/22 Left  10/10 Left 04/30/22  Hip flexion         Hip extension         Hip abduction         Hip adduction         Hip internal rotation         Hip external rotation         Knee flexion  70 80 102 103 106 104 AAROM 109  Knee extension 0 12 3 Lacking 4 Lacking 3 Lacking 1 0  Ankle dorsiflexion         Ankle plantarflexion         Ankle inversion         Ankle eversion          (Blank rows = not tested)  LOWER EXTREMITY MMT:  MMT Right eval Left eval Left 04/21/22  Hip flexion     Hip extension     Hip abduction     Hip adduction     Hip internal rotation     Hip external rotation     Knee flexion  3/5 5  Knee extension  3/5 4+  Ankle dorsiflexion  4/5 5  Ankle plantarflexion     Ankle inversion     Ankle eversion      (Blank rows = not tested)    FUNCTIONAL TESTS:  5 times sit to stand: With UE assist 22.12  2 minute walk test: 44' with rolling walker                       Single leg raise:  unable.    Reassessment 04/21/22 5 times sit to stand: With UE assist 15.39 seconds 2 minute walk test: 226 feet with SPC   TODAY'S TREATMENT: 04/30/22 Bike 5' for dynamic warm up and ROM full revolution Knee drive on step  10 x 10 second holds Step up 6 inch 2 x 10 LLE Lateral step up 6 inch 2 x 10 LLE Stairs 7 inch alternating with bilateral UE support 4 RT SLS with vectors 1 HHA 5 x 5 second holds  bilateral  Lateral stepping 6 x 10 feet bilateral  STS 2x 10 without UE support  10/10: Bike 5' for dynamic warm up and ROM full revolution Knee drive on 75TZ 10 00-17" holds Heel raise incline slope 20x Toe raises decline slope 20x TKE 15x 5" with RTB Minisquat 10x  Seated: Controlled STS no HHA equal weight bearing 10x Heel slide to end range then AAROM for flexion 10x 10" LAQ  Supine: Heel slide AROM 1-106, AAROM 110 flexion with rope  04/23/22 SLR 3x 10 LLE Bike 5 minutes for dynamic warm up and ROM - seat 8 Step up 6 inch 2x 10  Lateral step up 6 inch 2x 10 STS 2x 10 without UE support Lateral stepping 6x 10 feet bilateral   04/21/22 Bike 5 minutes for dynamic warm up and ROM - seat 8 Reassessment HR x 20 Mini squats 2 x 10 Step up 4 inch 1x 15, 6 inch 2x 10  Knee drives on 12 inch step 2x 10 x 5-10 second holds    PATIENT EDUCATION:  Education details: 10/5 HEP; 10/3 HEP, reassessment findings Person educated: Patient Education method: Explanation, Verbal cues, and Handouts Education comprehension: verbalized understanding and returned demonstration   HOME EXERCISE PROGRAM: Access Code: [email or web address] 04/23/22 - Sit to Stand with Arms Crossed  - 2 x daily - 7 x weekly - 2 sets - 10 reps  Date: 04/09/2022 - Seated Long Arc Quad  - 2 x daily - 7 x weekly - 1 sets - 10 reps - 3-5" hold - Supine Ankle Pumps  - 2 x daily - 7 x weekly - 1 sets - 10 reps - 3-5" hold - Long Sitting Quad Set  - 2 x daily - 7 x weekly - 1 sets - 10 reps - 3-5" hold - Supine Heel Slides  - 2 x daily - 7 x weekly - 1 sets - 10 reps - 3-5" hold - Active Straight Leg Raise with Quad Set  - 2 x daily - 7 x weekly - 1 sets - 10 reps - 3" hold  ASSESSMENT:  CLINICAL IMPRESSION: Began session on bike for dynamic  warm up. Patient demonstrating ROM from 0 to 104 today with 109 AAROM. Able to complete reciprocal steps with heavy UE support. Overall, progressing well. Patient will continue to benefit from physical therapy in order to improve function and reduce impairment.    OBJECTIVE IMPAIRMENTS Abnormal gait, decreased activity tolerance, decreased balance, decreased mobility, difficulty walking, decreased ROM, decreased strength, increased edema, and pain.   ACTIVITY LIMITATIONS carrying, lifting, bending, sitting, standing, squatting, stairs, dressing, and locomotion level  PARTICIPATION LIMITATIONS: meal prep, cleaning, laundry, driving, shopping, community activity, and yard work  [employer] POTENTIAL: Good  CLINICAL DECISION MAKING: Stable/uncomplicated  EVALUATION COMPLEXITY: Low   GOALS: Goals reviewed with patient? Yes  SHORT TERM GOALS: Target date: 04/30/2022  PT to be I in HEP in order to increase her ROM to 5-90 Baseline: Goal status: MET  2.  Pt strength to be increased by 1/2 grade to allow pt to feel confident walking with a cane. Baseline:  Goal status: MET  3.  Pt to be able to sit for 30 minutes in comfort for car rides/ waiting at MD  Baseline:  Goal status: MET  4.  PT pain level to be no greater than a 4/10 to be able to walk for 15 minutes. Baseline:  Goal status: IN PROGRESS   LONG TERM GOALS: Target date: 05/21/2022   PT  to be I in an advanced  HEP to allow pt ROM to be 0-115 to allow pt to have a normalized gt and squat down.  Baseline:  Goal status: IN PROGRESS  2.  PT strength to be increased one grade to allow pt to rise from a squatted position without having to use her UE. Baseline:  Goal status: MET  3.  Pt pain in Lt LE to be no greater than a 2/10 to allow pt to be on her feet with no assistive device for an hour at a time for shopping/community events. Baseline:  Goal status: IN PROGRESS  4.  PT to have improved power to be able to complete 5 sit  to stand in 12 seconds or less Baseline:  Goal status: IN PROGRESS  5.  PT to be able to walk 225 feet in two minutes or less with no assistive device.  Baseline:  Goal status: MET   PLAN: PT FREQUENCY: 2x/week  PT DURATION: 6 weeks  PLANNED INTERVENTIONS: Therapeutic exercises, Therapeutic activity, Neuromuscular re-education, Balance training, Gait training, Patient/Family education, Self Care, and Manual therapy  PLAN FOR NEXT SESSION: Progress ROM, strength and balance  1:04 PM, 04/30/22 Mearl Latin PT, DPT Physical Therapist at [facility]

## 2022-05-05 ENCOUNTER — Encounter (HOSPITAL_COMMUNITY): Payer: Medicare PPO

## 2022-05-07 ENCOUNTER — Ambulatory Visit (HOSPITAL_COMMUNITY): Payer: Medicare PPO | Admitting: Physical Therapy

## 2022-05-07 ENCOUNTER — Encounter (HOSPITAL_COMMUNITY): Payer: Self-pay | Admitting: Physical Therapy

## 2022-05-07 DIAGNOSIS — G8929 Other chronic pain: Secondary | ICD-10-CM | POA: Diagnosis not present

## 2022-05-07 DIAGNOSIS — Z6833 Body mass index (BMI) 33.0-33.9, adult: Secondary | ICD-10-CM | POA: Diagnosis not present

## 2022-05-07 DIAGNOSIS — M6281 Muscle weakness (generalized): Secondary | ICD-10-CM

## 2022-05-07 DIAGNOSIS — R030 Elevated blood-pressure reading, without diagnosis of hypertension: Secondary | ICD-10-CM | POA: Diagnosis not present

## 2022-05-07 DIAGNOSIS — R262 Difficulty in walking, not elsewhere classified: Secondary | ICD-10-CM | POA: Diagnosis not present

## 2022-05-07 DIAGNOSIS — M25662 Stiffness of left knee, not elsewhere classified: Secondary | ICD-10-CM | POA: Diagnosis not present

## 2022-05-07 DIAGNOSIS — M25562 Pain in left knee: Secondary | ICD-10-CM | POA: Diagnosis not present

## 2022-05-07 NOTE — Therapy (Signed)
OUTPATIENT PHYSICAL THERAPY TREATMENT   Patient Name: Shawna Lewis MRN: [id number] DOB:September 24, 1950, 72 y.o., female Today's Date: 05/07/2022  PHYSICAL THERAPY DISCHARGE SUMMARY  Visits from Start of Care: 8  Current functional level related to goals / functional outcomes: See below   Remaining deficits: See below   Education / Equipment: See below   Patient agrees to discharge. Patient goals were met. Patient is being discharged due to being pleased with the current functional level.    PT End of Session - 05/07/22 1302     Visit Number 8    Number of Visits 12    Date for PT Re-Evaluation 05/21/22    Authorization Type [employer] Choice;  auth required; no VL    Authorization Time Period 12 visits required 9/21- 05/21/22    Authorization - Visit Number 8    Authorization - Number of Visits 12    Progress Note Due on Visit 14    PT Start Time 5400    PT Stop Time 1342    PT Time Calculation (min) 44 min    Activity Tolerance Patient tolerated treatment well;No increased pain    Behavior During Therapy WFL for tasks assessed/performed               Past Medical History:  Diagnosis Date   Anemia    Arthritis    DDD (degenerative disc disease), lumbar    Family history of adverse reaction to anesthesia    father had pancreatic cancer received anesthesia with artery replacement and soon as received anesthesia had stroke never knew quite what happened per pt   Fibromyalgia    GERD (gastroesophageal reflux disease)    Heart murmur    Hypertension    Migraine    OCD (obsessive compulsive disorder)    Osteomyelitis ([place])    Pre-diabetes    Past Surgical History:  Procedure Laterality Date   ANKLE FRACTURE SURGERY  2018   BALLOON DILATION  05/23/2021   Procedure: BALLOON DILATION;  Surgeon: Harvel Quale, MD;  [facility];  Service: Gastroenterology;;  ascending colon strictures   BIOPSY  05/23/2021   Procedure: BIOPSY;   Surgeon: Harvel Quale, MD;  [facility];  Service: Gastroenterology;;   BREAST BIOPSY  1995   CATARACT EXTRACTION     [place]   COLONOSCOPY WITH PROPOFOL N/A 05/23/2021   Procedure: COLONOSCOPY WITH PROPOFOL;  Surgeon: Harvel Quale, MD;  [facility];  Service: Gastroenterology;  Laterality: N/A;  10:10   ESOPHAGOGASTRODUODENOSCOPY (EGD) WITH PROPOFOL N/A 05/23/2021   Procedure: ESOPHAGOGASTRODUODENOSCOPY (EGD) WITH PROPOFOL;  Surgeon: Harvel Quale, MD;  [facility];  Service: Gastroenterology;  Laterality: N/A;   IR LUMBAR DISC ASPIRATION W/IMG GUIDE  06/22/2019   KNEE ARTHROSCOPY W/ MENISCAL REPAIR     RADIOFREQUENCY ABLATION NERVES     TONSILLECTOMY  1974   TOTAL KNEE ARTHROPLASTY Right 12/09/2021   Procedure: TOTAL KNEE ARTHROPLASTY;  Surgeon: Renette Butters, MD;  [facility];  Service: Orthopedics;  Laterality: Right;   TOTAL KNEE ARTHROPLASTY Left 04/07/2022   Procedure: LEFT TOTAL KNEE ARTHROPLASTY;  Surgeon: Renette Butters, MD;  [facility];  Service: Orthopedics;  Laterality: Left;   Patient Active Problem List   Diagnosis Date Noted   S/P total knee arthroplasty, left 04/07/2022   S/P total knee arthroplasty, right 12/09/2021   Positive FIT (fecal immunochemical test) 05/14/2021   Anemia 05/14/2021   Therapeutic  drug monitoring 06/30/2019   Chronic left lumbar radiculopathy 06/21/2019   Diskitis 06/20/2019   Osteomyelitis ([place]) 06/20/2019   HTN (hypertension) 06/20/2019   DDD (degenerative disc disease), lumbar    Migraine    Chronic low back pain    Central stenosis of spinal canal     PCP: Allyn Kenner   REFERRING PROVIDER: Renette Butters, MD  REFERRING DIAG: s/p Lt TKR  THERAPY DIAG:  Lt knee stiffness Muscle weakness Difficulty in walking    Rationale for Evaluation and Treatment Rehabilitation  ONSET DATE: DATE OF SURGERY:  04/07/2022  SUBJECTIVE:    SUBJECTIVE STATEMENT: Pt stated she may have strained her back/ribs when reaching for something. She has been having some lightheaded. Change in BP meds. Heart pounding with exertion. No chest pain or SOB. Continues to have soreness in knee. Difficulty with back limiting ability to stand up straight. Feels 100% improvement with PT intervention.  PERTINENT HISTORY: Rt TKR, chronic LBP  PAIN:  Are you having pain? Yes: NPRS scale: 2/10 Pain location: Lt knee  Pain description: throbbing  Aggravating factors: bending weight bearing  Relieving factors: rest  PRECAUTIONS: None  WEIGHT BEARING RESTRICTIONS No  FALLS:  Has patient fallen in last 6 months? No  LIVING ENVIRONMENT: Lives with: lives with their family Lives in: House/apartment Stairs: Yes: External: 5 steps; on right going up Has following equipment at home: Single point cane, shower chair, and bed side commode  OCCUPATION: retired   PLOF: Independent  PATIENT GOALS To get her knee working well   OBJECTIVE: (objective measures from evaluation unless otherwise dated)  PATIENT SURVEYS:  FOTO 43  05/07/22 72% function  COGNITION:  Overall cognitive status: Within functional limits for tasks assessed       EDEMA:  Circumferential: LT: 191/2"     POSTURE: No Significant postural limitations   LOWER EXTREMITY ROM:  Active ROM Right eval Left eval 04/14/22 Left Left 04/21/22 Left 04/23/22 Left  10/10 Left 04/30/22 Left  10/19  Hip flexion          Hip extension          Hip abduction          Hip adduction          Hip internal rotation          Hip external rotation          Knee flexion  70 80 102 103 106 104 AAROM 109 109 AAROM 113za  Knee extension 0 12 3 Lacking 4 Lacking 3 Lacking 1 0 0  Ankle dorsiflexion          Ankle plantarflexion          Ankle inversion          Ankle eversion           (Blank rows = not tested)  LOWER EXTREMITY MMT:  MMT Right eval Left eval Left 04/21/22  Left 05/07/22  Hip flexion      Hip extension      Hip abduction      Hip adduction      Hip internal rotation      Hip external rotation      Knee flexion  3/_0 Knee extension  3/5 4+ 5  Ankle dorsiflexion  4/_1 Ankle plantarflexion      Ankle inversion      Ankle eversion       (Blank rows = not tested)  FUNCTIONAL TESTS:  5 times sit to stand: With UE assist 22.12  2 minute walk test: 39' with rolling walker                       Single leg raise:  unable.    Reassessment 04/21/22 5 times sit to stand: With UE assist 15.39 seconds 2 minute walk test: 226 feet with SPC  Reassessment 05/07/22 5 times sit to stand: With UE assist 14.96 seconds 2 minute walk test: 250 feet with SPC  TODAY'S TREATMENT: 05/07/22 Quad set 10 x 10 second holds Heel slides 10 x 10 second holds Reassessment  04/30/22 Bike 5' for dynamic warm up and ROM full revolution Knee drive on step 10 x 10 second holds Step up 6 inch 2 x 10 LLE Lateral step up 6 inch 2 x 10 LLE Stairs 7 inch alternating with bilateral UE support 4 RT SLS with vectors 1 HHA 5 x 5 second holds bilateral  Lateral stepping 6 x 10 feet bilateral  STS 2x 10 without UE support  10/10: Bike 5' for dynamic warm up and ROM full revolution Knee drive on 43PI 10 95-18" holds Heel raise incline slope 20x Toe raises decline slope 20x TKE 15x 5" with RTB Minisquat 10x  Seated: Controlled STS no HHA equal weight bearing 10x Heel slide to end range then AAROM for flexion 10x 10" LAQ  Supine: Heel slide AROM 1-106, AAROM 110 flexion with rope  04/23/22 SLR 3x 10 LLE Bike 5 minutes for dynamic warm up and ROM - seat 8 Step up 6 inch 2x 10  Lateral step up 6 inch 2x 10 STS 2x 10 without UE support Lateral stepping 6x 10 feet bilateral   04/21/22 Bike 5 minutes for dynamic warm up and ROM - seat 8 Reassessment HR x 20 Mini squats 2 x 10 Step up 4 inch 1x 15, 6 inch 2x 10  Knee drives on 12 inch step 2x  10 x 5-10 second holds    PATIENT EDUCATION:  Education details: 10/19 HEP, returning to PT if needed, FU with MD regarding cardiac symptoms10/5 HEP; 10/3 HEP, reassessment findings Person educated: Patient Education method: Explanation, Verbal cues, and Handouts Education comprehension: verbalized understanding and returned demonstration   HOME EXERCISE PROGRAM: Access Code: [email or web address] 04/23/22 - Sit to Stand with Arms Crossed  - 2 x daily - 7 x weekly - 2 sets - 10 reps  Date: 04/09/2022 - Seated Long Arc Quad  - 2 x daily - 7 x weekly - 1 sets - 10 reps - 3-5" hold - Supine Ankle Pumps  - 2 x daily - 7 x weekly - 1 sets - 10 reps - 3-5" hold - Long Sitting Quad Set  - 2 x daily - 7 x weekly - 1 sets - 10 reps - 3-5" hold - Supine Heel Slides  - 2 x daily - 7 x weekly - 1 sets - 10 reps - 3-5" hold - Active Straight Leg Raise with Quad Set  - 2 x daily - 7 x weekly - 1 sets - 10 reps - 3" hold  ASSESSMENT:  CLINICAL IMPRESSION: Patient demonstrating ROM from 0 - 113 today. Patient has met 4/4 short term goals and 4/5 long term goals with ability to complete HEP and improvement in symptoms, strength, ROM, activity tolerance, gait, balance, and functional mobility. Remaining goals not met due to continued deficits in power, functional mobility. Patient has made good progress toward  remaining goals. Patient is not limited with ADL and feels good about current mobility status. Discussed obtaining referral for her back symptoms. Discussed current symptoms likely cardiac related and educated on follow up with PCP or ER if needed. Patient discharged from physical therapy at this time.     OBJECTIVE IMPAIRMENTS Abnormal gait, decreased activity tolerance, decreased balance, decreased mobility, difficulty walking, decreased ROM, decreased strength, increased edema, and pain.   ACTIVITY LIMITATIONS carrying, lifting, bending, sitting, standing, squatting, stairs, dressing, and locomotion  level  PARTICIPATION LIMITATIONS: meal prep, cleaning, laundry, driving, shopping, community activity, and yard work  [employer] POTENTIAL: Good  CLINICAL DECISION MAKING: Stable/uncomplicated  EVALUATION COMPLEXITY: Low   GOALS: Goals reviewed with patient? Yes  SHORT TERM GOALS: Target date: 04/30/2022  PT to be I in HEP in order to increase her ROM to 5-90 Baseline: Goal status: MET  2.  Pt strength to be increased by 1/2 grade to allow pt to feel confident walking with a cane. Baseline:  Goal status: MET  3.  Pt to be able to sit for 30 minutes in comfort for car rides/ waiting at MD  Baseline:  Goal status: MET  4.  PT pain level to be no greater than a 4/10 to be able to walk for 15 minutes. Baseline:  Goal status: MET   LONG TERM GOALS: Target date: 05/21/2022   PT to be I in an advanced  HEP to allow pt ROM to be 0-115 to allow pt to have a normalized gt and squat down.  Baseline:  Goal status: MET  2.  PT strength to be increased one grade to allow pt to rise from a squatted position without having to use her UE. Baseline:  Goal status: MET  3.  Pt pain in Lt LE to be no greater than a 2/10 to allow pt to be on her feet with no assistive device for an hour at a time for shopping/community events. Baseline:  Goal status: MET  4.  PT to have improved power to be able to complete 5 sit to stand in 12 seconds or less Baseline:  Goal status: NOT MET  5.  PT to be able to walk 225 feet in two minutes or less with no assistive device.  Baseline:  Goal status: MET   PLAN: PT FREQUENCY: 2x/week  PT DURATION: 6 weeks  PLANNED INTERVENTIONS: Therapeutic exercises, Therapeutic activity, Neuromuscular re-education, Balance training, Gait training, Patient/Family education, Self Care, and Manual therapy  PLAN FOR NEXT SESSION: Progress ROM, strength and balance  1:49 PM, 05/07/22 Mearl Latin PT, DPT Physical Therapist at [facility]

## 2022-05-11 ENCOUNTER — Encounter (HOSPITAL_COMMUNITY): Payer: Medicare PPO | Admitting: Physical Therapy

## 2022-05-11 DIAGNOSIS — I10 Essential (primary) hypertension: Secondary | ICD-10-CM | POA: Diagnosis not present

## 2022-05-12 ENCOUNTER — Encounter (HOSPITAL_COMMUNITY): Payer: Medicare PPO

## 2022-05-13 ENCOUNTER — Encounter (INDEPENDENT_AMBULATORY_CARE_PROVIDER_SITE_OTHER): Payer: Self-pay | Admitting: *Deleted

## 2022-05-14 ENCOUNTER — Encounter (HOSPITAL_COMMUNITY): Payer: Medicare PPO

## 2022-05-19 ENCOUNTER — Encounter (HOSPITAL_COMMUNITY): Payer: Medicare PPO

## 2022-05-21 ENCOUNTER — Encounter (HOSPITAL_COMMUNITY): Payer: Medicare PPO

## 2022-06-08 ENCOUNTER — Other Ambulatory Visit (INDEPENDENT_AMBULATORY_CARE_PROVIDER_SITE_OTHER): Payer: Self-pay | Admitting: Gastroenterology

## 2022-06-08 DIAGNOSIS — K2950 Unspecified chronic gastritis without bleeding: Secondary | ICD-10-CM

## 2022-06-09 DIAGNOSIS — Z23 Encounter for immunization: Secondary | ICD-10-CM | POA: Diagnosis not present

## 2022-06-09 DIAGNOSIS — G8929 Other chronic pain: Secondary | ICD-10-CM | POA: Diagnosis not present

## 2022-06-09 DIAGNOSIS — M545 Low back pain, unspecified: Secondary | ICD-10-CM | POA: Diagnosis not present

## 2022-06-09 DIAGNOSIS — Z79899 Other long term (current) drug therapy: Secondary | ICD-10-CM | POA: Diagnosis not present

## 2022-06-09 DIAGNOSIS — M1990 Unspecified osteoarthritis, unspecified site: Secondary | ICD-10-CM | POA: Diagnosis not present

## 2022-06-09 DIAGNOSIS — Z6832 Body mass index (BMI) 32.0-32.9, adult: Secondary | ICD-10-CM | POA: Diagnosis not present

## 2022-06-09 DIAGNOSIS — G894 Chronic pain syndrome: Secondary | ICD-10-CM | POA: Diagnosis not present

## 2022-06-09 DIAGNOSIS — R030 Elevated blood-pressure reading, without diagnosis of hypertension: Secondary | ICD-10-CM | POA: Diagnosis not present

## 2022-07-09 DIAGNOSIS — R030 Elevated blood-pressure reading, without diagnosis of hypertension: Secondary | ICD-10-CM | POA: Diagnosis not present

## 2022-07-09 DIAGNOSIS — M545 Low back pain, unspecified: Secondary | ICD-10-CM | POA: Diagnosis not present

## 2022-07-09 DIAGNOSIS — Z79899 Other long term (current) drug therapy: Secondary | ICD-10-CM | POA: Diagnosis not present

## 2022-07-09 DIAGNOSIS — Z6832 Body mass index (BMI) 32.0-32.9, adult: Secondary | ICD-10-CM | POA: Diagnosis not present

## 2022-07-21 DIAGNOSIS — R7303 Prediabetes: Secondary | ICD-10-CM | POA: Diagnosis not present

## 2022-07-21 DIAGNOSIS — I10 Essential (primary) hypertension: Secondary | ICD-10-CM | POA: Diagnosis not present

## 2022-07-21 DIAGNOSIS — D649 Anemia, unspecified: Secondary | ICD-10-CM | POA: Diagnosis not present

## 2022-07-22 LAB — LAB REPORT - SCANNED: EGFR: 97

## 2022-07-24 DIAGNOSIS — E782 Mixed hyperlipidemia: Secondary | ICD-10-CM | POA: Diagnosis not present

## 2022-07-24 DIAGNOSIS — I10 Essential (primary) hypertension: Secondary | ICD-10-CM | POA: Diagnosis not present

## 2022-07-24 DIAGNOSIS — Z0000 Encounter for general adult medical examination without abnormal findings: Secondary | ICD-10-CM | POA: Diagnosis not present

## 2022-07-24 DIAGNOSIS — M549 Dorsalgia, unspecified: Secondary | ICD-10-CM | POA: Diagnosis not present

## 2022-07-24 DIAGNOSIS — D649 Anemia, unspecified: Secondary | ICD-10-CM | POA: Diagnosis not present

## 2022-07-24 DIAGNOSIS — R600 Localized edema: Secondary | ICD-10-CM | POA: Diagnosis not present

## 2022-07-24 DIAGNOSIS — R7303 Prediabetes: Secondary | ICD-10-CM | POA: Diagnosis not present

## 2022-07-24 DIAGNOSIS — G8929 Other chronic pain: Secondary | ICD-10-CM | POA: Diagnosis not present

## 2022-07-24 DIAGNOSIS — E785 Hyperlipidemia, unspecified: Secondary | ICD-10-CM | POA: Diagnosis not present

## 2022-07-30 DIAGNOSIS — G2581 Restless legs syndrome: Secondary | ICD-10-CM | POA: Diagnosis not present

## 2022-07-30 DIAGNOSIS — K219 Gastro-esophageal reflux disease without esophagitis: Secondary | ICD-10-CM | POA: Diagnosis not present

## 2022-07-30 DIAGNOSIS — G43909 Migraine, unspecified, not intractable, without status migrainosus: Secondary | ICD-10-CM | POA: Diagnosis not present

## 2022-07-30 DIAGNOSIS — E669 Obesity, unspecified: Secondary | ICD-10-CM | POA: Diagnosis not present

## 2022-07-30 DIAGNOSIS — R600 Localized edema: Secondary | ICD-10-CM | POA: Diagnosis not present

## 2022-07-30 DIAGNOSIS — I10 Essential (primary) hypertension: Secondary | ICD-10-CM | POA: Diagnosis not present

## 2022-07-30 DIAGNOSIS — Z79891 Long term (current) use of opiate analgesic: Secondary | ICD-10-CM | POA: Diagnosis not present

## 2022-07-30 DIAGNOSIS — M1990 Unspecified osteoarthritis, unspecified site: Secondary | ICD-10-CM | POA: Diagnosis not present

## 2022-07-30 DIAGNOSIS — E785 Hyperlipidemia, unspecified: Secondary | ICD-10-CM | POA: Diagnosis not present

## 2022-08-01 IMAGING — DX DG KNEE 1-2V PORT*R*
2 series · 2 of 2 positions shown · non-contrast
Comparison: None Available.

CLINICAL DATA: Status post right knee arthroplasty

EXAM:
PORTABLE RIGHT KNEE - 1-2 VIEW

[knee ap]
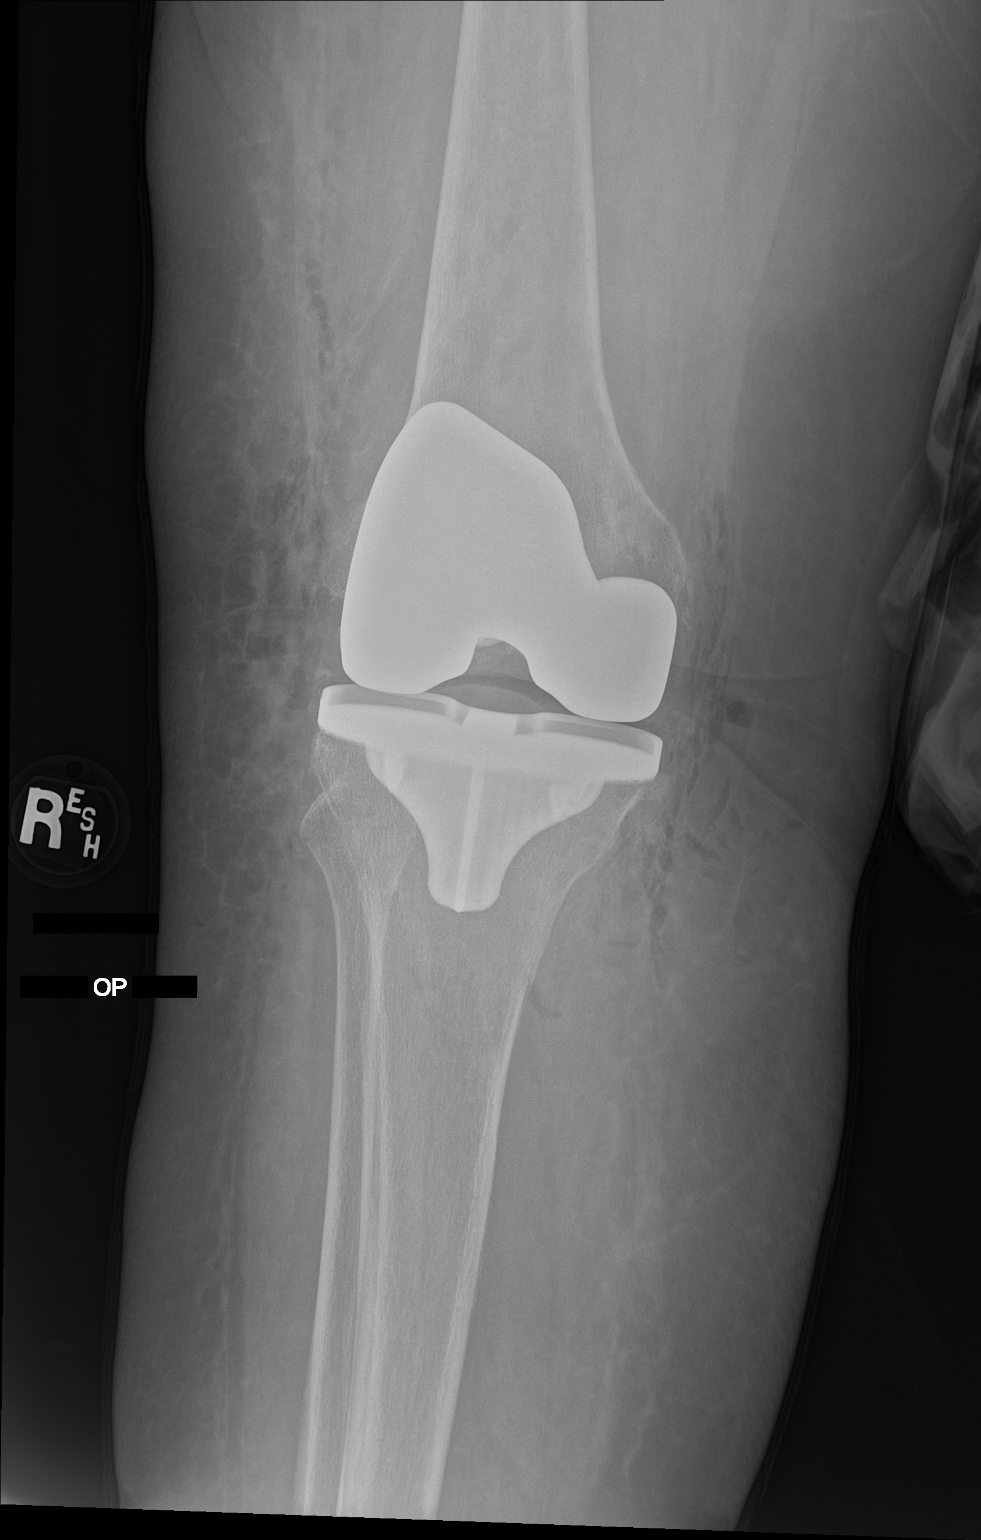

[knee lat]
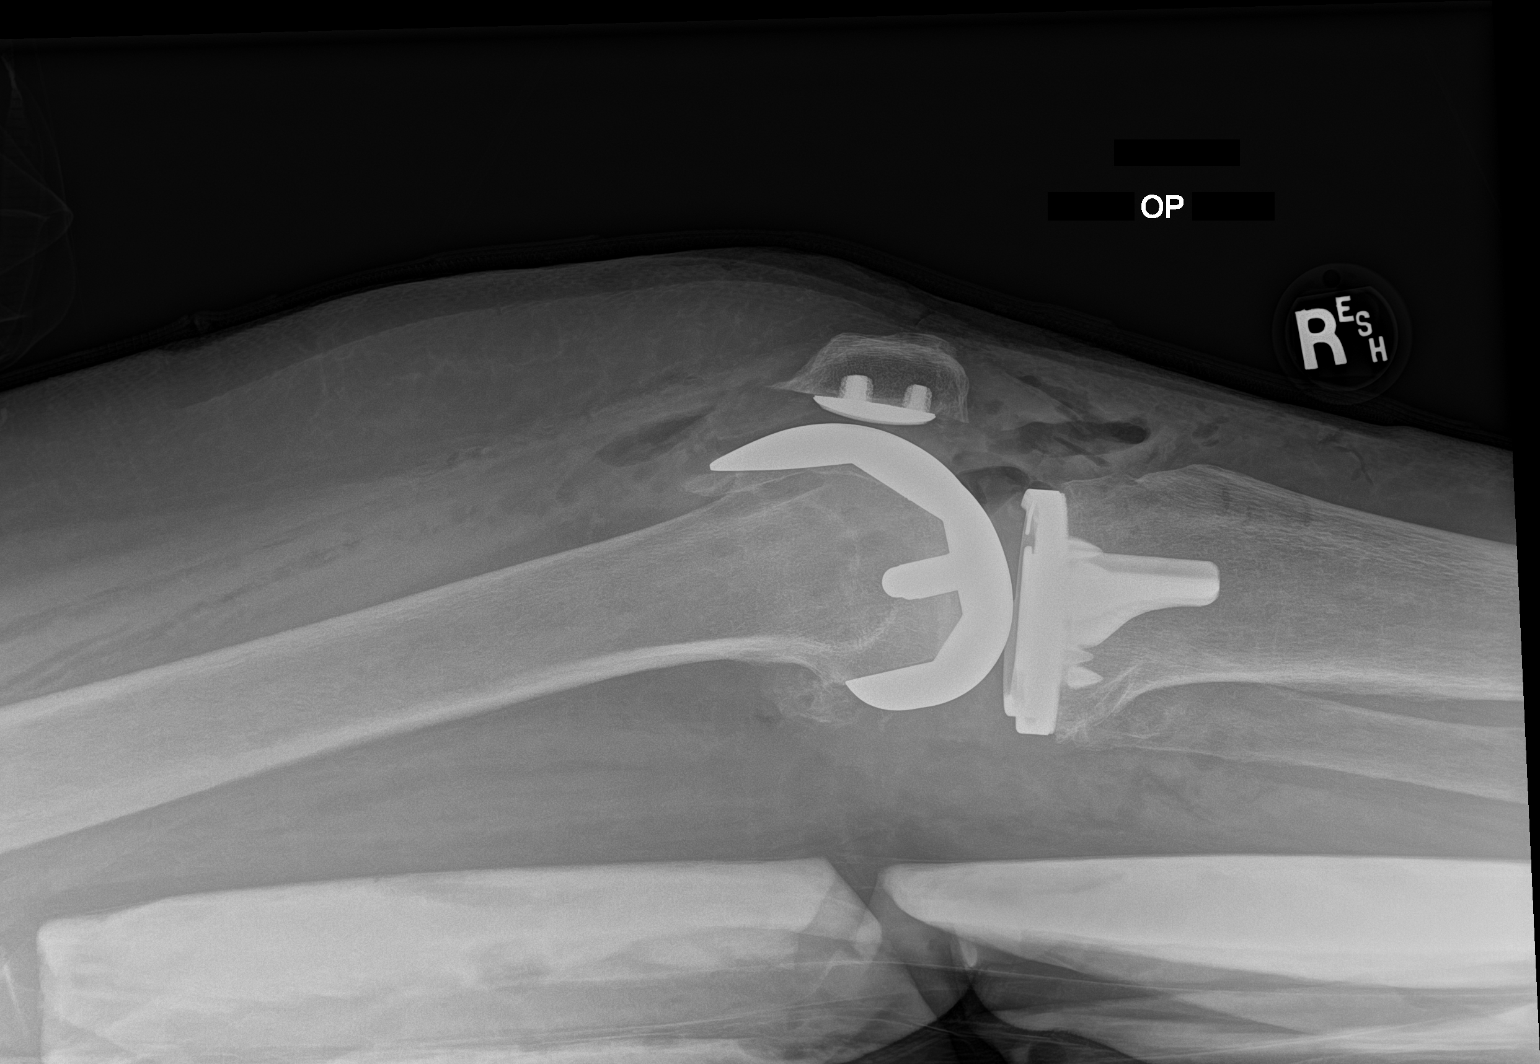

[2 of 2 positions shown; findings below may reference images not displayed]

FINDINGS: Status post right knee total arthroplasty with expected overlying
postoperative change. No evidence of perihardware fracture or
component malpositioning.
IMPRESSION: Status post right knee total arthroplasty with expected overlying
postoperative change. No evidence of perihardware fracture or
component malpositioning.

## 2022-08-10 DIAGNOSIS — R7303 Prediabetes: Secondary | ICD-10-CM | POA: Diagnosis not present

## 2022-08-10 DIAGNOSIS — Z79899 Other long term (current) drug therapy: Secondary | ICD-10-CM | POA: Diagnosis not present

## 2022-08-10 DIAGNOSIS — R030 Elevated blood-pressure reading, without diagnosis of hypertension: Secondary | ICD-10-CM | POA: Diagnosis not present

## 2022-08-10 DIAGNOSIS — G894 Chronic pain syndrome: Secondary | ICD-10-CM | POA: Diagnosis not present

## 2022-08-10 DIAGNOSIS — M545 Low back pain, unspecified: Secondary | ICD-10-CM | POA: Diagnosis not present

## 2022-08-10 DIAGNOSIS — Z6832 Body mass index (BMI) 32.0-32.9, adult: Secondary | ICD-10-CM | POA: Diagnosis not present

## 2022-08-10 DIAGNOSIS — G8929 Other chronic pain: Secondary | ICD-10-CM | POA: Diagnosis not present

## 2022-08-10 DIAGNOSIS — M1990 Unspecified osteoarthritis, unspecified site: Secondary | ICD-10-CM | POA: Diagnosis not present

## 2022-08-25 ENCOUNTER — Encounter (INDEPENDENT_AMBULATORY_CARE_PROVIDER_SITE_OTHER): Payer: Self-pay | Admitting: *Deleted

## 2022-09-07 DIAGNOSIS — Z79899 Other long term (current) drug therapy: Secondary | ICD-10-CM | POA: Diagnosis not present

## 2022-09-07 DIAGNOSIS — R030 Elevated blood-pressure reading, without diagnosis of hypertension: Secondary | ICD-10-CM | POA: Diagnosis not present

## 2022-09-07 DIAGNOSIS — G894 Chronic pain syndrome: Secondary | ICD-10-CM | POA: Diagnosis not present

## 2022-09-07 DIAGNOSIS — Z6832 Body mass index (BMI) 32.0-32.9, adult: Secondary | ICD-10-CM | POA: Diagnosis not present

## 2022-10-07 DIAGNOSIS — Z6831 Body mass index (BMI) 31.0-31.9, adult: Secondary | ICD-10-CM | POA: Diagnosis not present

## 2022-10-07 DIAGNOSIS — M1990 Unspecified osteoarthritis, unspecified site: Secondary | ICD-10-CM | POA: Diagnosis not present

## 2022-10-07 DIAGNOSIS — M545 Low back pain, unspecified: Secondary | ICD-10-CM | POA: Diagnosis not present

## 2022-10-07 DIAGNOSIS — Z79899 Other long term (current) drug therapy: Secondary | ICD-10-CM | POA: Diagnosis not present

## 2022-10-07 DIAGNOSIS — R030 Elevated blood-pressure reading, without diagnosis of hypertension: Secondary | ICD-10-CM | POA: Diagnosis not present

## 2022-10-22 ENCOUNTER — Encounter (INDEPENDENT_AMBULATORY_CARE_PROVIDER_SITE_OTHER): Payer: Self-pay | Admitting: Gastroenterology

## 2022-10-22 ENCOUNTER — Ambulatory Visit (INDEPENDENT_AMBULATORY_CARE_PROVIDER_SITE_OTHER): Payer: Medicare PPO | Admitting: Gastroenterology

## 2022-10-22 VITALS — BP 135/62 | HR 72 | Temp 99.5°F | Ht 61.0 in | Wt 183.4 lb

## 2022-10-22 DIAGNOSIS — R194 Change in bowel habit: Secondary | ICD-10-CM

## 2022-10-22 DIAGNOSIS — D500 Iron deficiency anemia secondary to blood loss (chronic): Secondary | ICD-10-CM

## 2022-10-22 DIAGNOSIS — D509 Iron deficiency anemia, unspecified: Secondary | ICD-10-CM | POA: Insufficient documentation

## 2022-10-22 DIAGNOSIS — K56699 Other intestinal obstruction unspecified as to partial versus complete obstruction: Secondary | ICD-10-CM | POA: Diagnosis not present

## 2022-10-22 NOTE — Patient Instructions (Addendum)
Schedule colonoscopy Continue eating fiber in diet Continue avoiding high dose aspirin or NSAIDs Continue oral iron daily

## 2022-10-22 NOTE — Progress Notes (Signed)
Maylon Peppers, M.D. Gastroenterology & Hepatology [facility] [address] [place], [place] [address]  Primary Care Physician: Celene Squibb, MD [address] [place] [address]  I will communicate my assessment and recommendations to the referring MD via EMR.  Problems: Iron deficiency anemia NSAID induced chronic strictures x 2  History of Present Illness: Shawna Lewis is a 72 y.o. female with past medical history of fibromyalgia, hypertension, degenerative disc disease, arthritis, migraine, who presents for evaluation of iron deficiency anemia and colonic strictures.  The patient was last seen on 11/11/2021. At that time, the patient was advised to continue with oral iron and omeprazole 20 mg daily, as well as avoiding NSAIDs.  Patient reports that for 2 months she had a change in her bowel movement frequency as she was having bowel movement every other day instead of her daily bowel movement. States symptoms got better after she started eating more fiber in her diet, this resolved a month ago.  The patient denies having any nausea, vomiting, fever, chills, hematochezia, melena, hematemesis, abdominal distention, abdominal pain, diarrhea, jaundice, pruritus or weight loss.  Patient had knee replacement in May and September 2023.   Patient is currently taking for sulfate 325 mg daily.  Most recent labs from 07/21/2022 showed iron of 63, iron saturation 21%, TIBC 305, CMP with BUN 14, creatinine 0.61, sodium 142, potassium 4.8, bilirubin 0.3, alkaline phosphatase 73, AST 19, ALT 14, albumin 4.5, normal electrolytes, CBC with hemoglobin 11.5, WBC 5.3, platelets 182.  She took Celebrex for 2 and 3 week short intervals when she had her knee replacements, but is not taking any NSAIDs or high dose aspirin.  Last Colonoscopy:05/23/21 Diverticulosis in the ascending colon. - Stricture in the proximal ascending colon and in the mid  ascending colon. Dilated. - The distal rectum and anal verge are normal on retroflexion view. - No specimens collected. Last Endoscopy:-05/23/21 1 cm hiatal hernia. - Normal stomach. - Normal examined duodenum. Biopsied-normal  Past Medical History: Past Medical History:  Diagnosis Date   Anemia    Arthritis    DDD (degenerative disc disease), lumbar    Family history of adverse reaction to anesthesia    father had pancreatic cancer received anesthesia with artery replacement and soon as received anesthesia had stroke never knew quite what happened per pt   Fibromyalgia    GERD (gastroesophageal reflux disease)    Heart murmur    Hypertension    Migraine    OCD (obsessive compulsive disorder)    Osteomyelitis    Pre-diabetes     Past Surgical History: Past Surgical History:  Procedure Laterality Date   ANKLE FRACTURE SURGERY  2018   BALLOON DILATION  05/23/2021   Procedure: BALLOON DILATION;  Surgeon: Harvel Quale, MD;  [facility];  Service: Gastroenterology;;  ascending colon strictures   BIOPSY  05/23/2021   Procedure: BIOPSY;  Surgeon: Harvel Quale, MD;  [facility];  Service: Gastroenterology;;   BREAST BIOPSY  1995   CATARACT EXTRACTION     [place]   COLONOSCOPY WITH PROPOFOL N/A 05/23/2021   Procedure: COLONOSCOPY WITH PROPOFOL;  Surgeon: Harvel Quale, MD;  [facility];  Service: Gastroenterology;  Laterality: N/A;  10:10   ESOPHAGOGASTRODUODENOSCOPY (EGD) WITH PROPOFOL N/A 05/23/2021   Procedure: ESOPHAGOGASTRODUODENOSCOPY (EGD) WITH PROPOFOL;  Surgeon: Harvel Quale, MD;  [facility];  Service: Gastroenterology;  Laterality: N/A;  IR LUMBAR DISC ASPIRATION W/IMG GUIDE  06/22/2019   KNEE ARTHROSCOPY W/ MENISCAL REPAIR     RADIOFREQUENCY ABLATION NERVES     TONSILLECTOMY  1974   TOTAL KNEE ARTHROPLASTY Right 12/09/2021   Procedure: TOTAL KNEE  ARTHROPLASTY;  Surgeon: Renette Butters, MD;  Location: [facility];  Service: Orthopedics;  Laterality: Right;   TOTAL KNEE ARTHROPLASTY Left 04/07/2022   Procedure: LEFT TOTAL KNEE ARTHROPLASTY;  Surgeon: Renette Butters, MD;  Location: [facility];  Service: Orthopedics;  Laterality: Left;    Family History: Family History  Problem Relation Age of Onset   COPD Mother    Heart disease Mother    Lymphoma Mother    Migraines Mother    Pancreatic cancer Father    Neuropathy Neg Hx     Social History: Social History   Tobacco Use  Smoking Status Former   Passive exposure: Past  Smokeless Tobacco Never   Social History   Substance and Sexual Activity  Alcohol Use Never   Social History   Substance and Sexual Activity  Drug Use No    Allergies: Allergies  Allergen Reactions   Tetracycline Nausea Only   Vancomycin Other (See Comments)    Neutropenia/anemia (after 4 weeks of treatment)    Medications: Current Outpatient Medications  Medication Sig Dispense Refill   acetaminophen (TYLENOL) 500 MG tablet Take 2 tablets (1,000 mg total) by mouth every 6 (six) hours as needed for mild pain or moderate pain. 60 tablet 0   acyclovir (ZOVIRAX) 800 MG tablet Take 800 mg by mouth daily as needed (fever blisters).     Bacillus Coagulans-Inulin (PROBIOTIC-PREBIOTIC PO) Take 2 capsules by mouth daily with supper.     baclofen (LIORESAL) 20 MG tablet Take 20 mg by mouth 3 (three) times daily as needed for muscle spasms.     felodipine (PLENDIL) 5 MG 24 hr tablet Take 5 mg by mouth every evening.     ferrous sulfate 325 (65 FE) MG tablet Take 325 mg by mouth daily with supper.     fluvoxaMINE (LUVOX) 100 MG tablet Take 100-250 mg by mouth See admin instructions. Take 1 tablet (100 mg) by mouth scheduled every evening, may increase to 2.5 tablets (250 mg) in the evening if OCD is heightened.     furosemide (LASIX) 20 MG tablet Take 20-40 mg by mouth daily as needed for fluid or edema.      hydrALAZINE (APRESOLINE) 25 MG tablet One and a half daily     losartan (COZAAR) 50 MG tablet Take 50-100 mg by mouth See admin instructions. Take 1 tablet (50 mg) by mouth scheduled every morning, may increase to 1.5-2 tablets (75 mg-100 mg) in the morning if blood pressure is trending high.     meclizine (ANTIVERT) 12.5 MG tablet Take 12.5 mg by mouth 2 (two) times daily as needed for dizziness.     Multiple Vitamins-Minerals (MULTIVITAMIN WITH MINERALS) tablet Take 1 tablet by mouth daily with supper.     Omega-3 Fatty Acids (OMEGA 3 500 PO) Take 1,000 mg by mouth daily with supper.     omeprazole (PRILOSEC) 20 MG capsule TAKE ONE CAPSULE (20MG  TOTAL) BY MOUTH DAILY 90 capsule 1   Oxycodone HCl 10 MG TABS Take 10 mg by mouth 6 (six) times daily.     oxymetazoline (AFRIN) 0.05 % nasal spray Place 1 spray into both nostrils 2 (two) times daily.     PHENobarbital (LUMINAL) 64.8 MG tablet Take 64.8 mg by mouth  daily as needed (at onset of migraine aura).     phenylephrine (SUDAFED PE) 10 MG TABS tablet Take 10 mg by mouth daily as needed (allergies/sinus issues.).     pramipexole (MIRAPEX) 0.25 MG tablet Take 0.25 mg by mouth at bedtime.     simethicone (MYLICON) 0000000 MG chewable tablet Chew 250 mg by mouth every 6 (six) hours as needed for flatulence.     Turmeric (QC TUMERIC COMPLEX PO) Take 2 capsules by mouth daily in the afternoon.     No current facility-administered medications for this visit.    Review of Systems: GENERAL: negative for malaise, night sweats HEENT: No changes in hearing or vision, no nose bleeds or other nasal problems. NECK: Negative for lumps, goiter, pain and significant neck swelling RESPIRATORY: Negative for cough, wheezing CARDIOVASCULAR: Negative for chest pain, leg swelling, palpitations, orthopnea GI: SEE HPI MUSCULOSKELETAL: Negative for joint pain or swelling, back pain, and muscle pain. SKIN: Negative for lesions, rash PSYCH: Negative for sleep disturbance,  mood disorder and recent psychosocial stressors. HEMATOLOGY Negative for prolonged bleeding, bruising easily, and swollen nodes. ENDOCRINE: Negative for cold or heat intolerance, polyuria, polydipsia and goiter. NEURO: negative for tremor, gait imbalance, syncope and seizures. The remainder of the review of systems is noncontributory.   Physical Exam: BP 135/62 (BP Location: Left Arm, Patient Position: Sitting, Cuff Size: Large)   Pulse 72   Temp 99.5 F (37.5 C) (Oral)   Ht 5\' 1"  (1.549 m)   Wt 183 lb 6.4 oz (83.2 kg)   BMI 34.65 kg/m  GENERAL: The patient is AO x3, in no acute distress. HEENT: Head is normocephalic and atraumatic. EOMI are intact. Mouth is well hydrated and without lesions. NECK: Supple. No masses LUNGS: Clear to auscultation. No presence of rhonchi/wheezing/rales. Adequate chest expansion HEART: RRR, normal s1 and s2. ABDOMEN: Soft, nontender, no guarding, no peritoneal signs, and nondistended. BS +. No masses. EXTREMITIES: Without any cyanosis, clubbing, rash, lesions or edema. NEUROLOGIC: AOx3, no focal motor deficit. SKIN: no jaundice, no rashes  Imaging/Labs: as above  I personally reviewed and interpreted the available labs, imaging and endoscopic files.  Impression and Plan: Shawna Lewis is a 72 y.o. female with past medical history of fibromyalgia, hypertension, degenerative disc disease, arthritis, migraine, who presents for evaluation of iron deficiency anemia and colonic strictures.  Patient has presented improvement in her iron deficiency anemia given her most recent blood workup.  She has responded to daily oral iron supplementation which she will continue for now and she should avoid any NSAIDs or high-dose aspirin as this potentially lead to her initial presentation of anemia.  She is due for colonoscopy which will be scheduled today as she had strictures which will need to be reevaluated and potentially biopsied.  The patient had transient  change in her bowel movement consistency which has resolved after she implemented the use of fiber in her diet.  She should continue with this for now.  - Schedule colonoscopy -Continue eating fiber in diet -Continue avoiding high dose aspirin or NSAIDs -Continue oral iron daily  All questions were answered.      Maylon Peppers, MD Gastroenterology and Hepatology [facility]

## 2022-10-26 ENCOUNTER — Telehealth (INDEPENDENT_AMBULATORY_CARE_PROVIDER_SITE_OTHER): Payer: Self-pay | Admitting: Gastroenterology

## 2022-10-26 DIAGNOSIS — K56699 Other intestinal obstruction unspecified as to partial versus complete obstruction: Secondary | ICD-10-CM

## 2022-10-26 MED ORDER — PEG 3350-KCL-NA BICARB-NACL 420 G PO SOLR: 4000.0000 mL | Freq: Once | ORAL | 0 refills | Status: AC

## 2022-10-26 NOTE — Telephone Encounter (Signed)
Pt called in to office to schedule colonoscopy. Colonoscopy scheduled for 11/25/22. Prep sent to pharmacy. Instructions sent via my chart. Lab orders placed.

## 2022-10-27 ENCOUNTER — Telehealth (INDEPENDENT_AMBULATORY_CARE_PROVIDER_SITE_OTHER): Payer: Self-pay | Admitting: Gastroenterology

## 2022-10-27 ENCOUNTER — Encounter (INDEPENDENT_AMBULATORY_CARE_PROVIDER_SITE_OTHER): Payer: Self-pay | Admitting: Gastroenterology

## 2022-10-27 NOTE — Telephone Encounter (Signed)
Pt left voicemail in regards to prep where she didn't have to do the enemas.  Returned call to patient. Pt states last time she had TCS she had the prep the did not include enemas. Looked in chart and saw that Clenpiq was given. Advised pt that I would have sample of Clenpiq with instructions up front for pick up. Pt verbalized understanding

## 2022-11-16 ENCOUNTER — Telehealth (INDEPENDENT_AMBULATORY_CARE_PROVIDER_SITE_OTHER): Payer: Self-pay | Admitting: Gastroenterology

## 2022-11-16 NOTE — Telephone Encounter (Signed)
Pt left voicemail stating she picked up prep from pharmacy and sample of Clenpiq from office.  Spoke with pt on 10/27/22 and had decided on Clenpiq Contacted pt back and made here aware of phone call on 10/27/22 and that Clenpiq was decided to used. Pt verbalized understanding

## 2022-11-17 DIAGNOSIS — M1990 Unspecified osteoarthritis, unspecified site: Secondary | ICD-10-CM | POA: Diagnosis not present

## 2022-11-17 DIAGNOSIS — G8929 Other chronic pain: Secondary | ICD-10-CM | POA: Diagnosis not present

## 2022-11-17 DIAGNOSIS — Z6831 Body mass index (BMI) 31.0-31.9, adult: Secondary | ICD-10-CM | POA: Diagnosis not present

## 2022-11-17 DIAGNOSIS — M545 Low back pain, unspecified: Secondary | ICD-10-CM | POA: Diagnosis not present

## 2022-11-17 DIAGNOSIS — R030 Elevated blood-pressure reading, without diagnosis of hypertension: Secondary | ICD-10-CM | POA: Diagnosis not present

## 2022-11-17 DIAGNOSIS — Z79899 Other long term (current) drug therapy: Secondary | ICD-10-CM | POA: Diagnosis not present

## 2022-11-19 ENCOUNTER — Other Ambulatory Visit (HOSPITAL_COMMUNITY)
Admission: RE | Admit: 2022-11-19 | Discharge: 2022-11-19 | Disposition: A | Discharge status: Home / Self Care | Payer: Medicare PPO | Source: Ambulatory Visit | Attending: Gastroenterology | Admitting: Gastroenterology

## 2022-11-19 DIAGNOSIS — K56699 Other intestinal obstruction unspecified as to partial versus complete obstruction: Secondary | ICD-10-CM | POA: Diagnosis not present

## 2022-11-19 LAB — BASIC METABOLIC PANEL
Anion gap: 9 (ref 5–15)
BUN: 13 mg/dL (ref 8–23)
CO2: 26 mmol/L (ref 22–32)
Calcium: 9.1 mg/dL (ref 8.9–10.3)
Chloride: 103 mmol/L (ref 98–111)
Creatinine, Ser: 0.56 mg/dL (ref 0.44–1.00)
GFR, Estimated: >60 mL/min (ref >60)
Glucose, Bld: 117 mg/dL — ABNORMAL HIGH (ref 70–99)
Potassium: 4.3 mmol/L (ref 3.5–5.1)
Sodium: 138 mmol/L (ref 135–145)

## 2022-11-25 ENCOUNTER — Other Ambulatory Visit: Payer: Self-pay

## 2022-11-25 ENCOUNTER — Encounter (HOSPITAL_COMMUNITY): Payer: Self-pay | Admitting: Gastroenterology

## 2022-11-25 ENCOUNTER — Ambulatory Visit (HOSPITAL_COMMUNITY): Payer: Medicare PPO | Admitting: Certified Registered"

## 2022-11-25 ENCOUNTER — Ambulatory Visit (HOSPITAL_COMMUNITY)
Admission: RE | Admit: 2022-11-25 | Discharge: 2022-11-25 | Disposition: A | Discharge status: Home / Self Care | Payer: Medicare PPO | Attending: Gastroenterology | Admitting: Gastroenterology

## 2022-11-25 ENCOUNTER — Encounter (HOSPITAL_COMMUNITY)
Admission: RE | Disposition: A | Discharge status: Home / Self Care | Payer: Self-pay | Source: Home / Self Care | Attending: Gastroenterology

## 2022-11-25 ENCOUNTER — Ambulatory Visit (HOSPITAL_BASED_OUTPATIENT_CLINIC_OR_DEPARTMENT_OTHER): Payer: Medicare PPO | Admitting: Certified Registered"

## 2022-11-25 DIAGNOSIS — K56699 Other intestinal obstruction unspecified as to partial versus complete obstruction: Secondary | ICD-10-CM | POA: Diagnosis present

## 2022-11-25 DIAGNOSIS — K56609 Unspecified intestinal obstruction, unspecified as to partial versus complete obstruction: Secondary | ICD-10-CM

## 2022-11-25 DIAGNOSIS — K633 Ulcer of intestine: Secondary | ICD-10-CM | POA: Insufficient documentation

## 2022-11-25 DIAGNOSIS — Z87891 Personal history of nicotine dependence: Secondary | ICD-10-CM

## 2022-11-25 DIAGNOSIS — M797 Fibromyalgia: Secondary | ICD-10-CM | POA: Insufficient documentation

## 2022-11-25 DIAGNOSIS — K219 Gastro-esophageal reflux disease without esophagitis: Secondary | ICD-10-CM | POA: Insufficient documentation

## 2022-11-25 DIAGNOSIS — K5730 Diverticulosis of large intestine without perforation or abscess without bleeding: Secondary | ICD-10-CM | POA: Insufficient documentation

## 2022-11-25 DIAGNOSIS — F429 Obsessive-compulsive disorder, unspecified: Secondary | ICD-10-CM | POA: Insufficient documentation

## 2022-11-25 DIAGNOSIS — D509 Iron deficiency anemia, unspecified: Secondary | ICD-10-CM | POA: Diagnosis not present

## 2022-11-25 DIAGNOSIS — K5790 Diverticulosis of intestine, part unspecified, without perforation or abscess without bleeding: Secondary | ICD-10-CM

## 2022-11-25 DIAGNOSIS — I10 Essential (primary) hypertension: Secondary | ICD-10-CM

## 2022-11-25 DIAGNOSIS — R011 Cardiac murmur, unspecified: Secondary | ICD-10-CM | POA: Diagnosis not present

## 2022-11-25 DIAGNOSIS — K529 Noninfective gastroenteritis and colitis, unspecified: Secondary | ICD-10-CM | POA: Diagnosis not present

## 2022-11-25 HISTORY — PX: BIOPSY: SHX5522

## 2022-11-25 HISTORY — PX: COLONOSCOPY WITH PROPOFOL: SHX5780

## 2022-11-25 LAB — HM COLONOSCOPY

## 2022-11-25 SURGERY — COLONOSCOPY WITH PROPOFOL
Anesthesia: General

## 2022-11-25 MED ORDER — LIDOCAINE HCL (PF) 2 % IJ SOLN
INTRAMUSCULAR | Status: AC
  Filled 2022-11-25: qty 5

## 2022-11-25 MED ORDER — PROPOFOL 10 MG/ML IV BOLUS
INTRAVENOUS | Status: DC | PRN
  Administered 2022-11-25: 50 mg via INTRAVENOUS
  Administered 2022-11-25: 20 mg via INTRAVENOUS

## 2022-11-25 MED ORDER — PROPOFOL 500 MG/50ML IV EMUL
INTRAVENOUS | Status: AC
  Filled 2022-11-25: qty 50

## 2022-11-25 MED ORDER — LACTATED RINGERS IV SOLN: INTRAVENOUS | Status: DC

## 2022-11-25 MED ORDER — PROPOFOL 500 MG/50ML IV EMUL
INTRAVENOUS | Status: DC | PRN
  Administered 2022-11-25: 125 ug/kg/min via INTRAVENOUS

## 2022-11-25 MED ORDER — LACTATED RINGERS IV SOLN
INTRAVENOUS | Status: DC
  Administered 2022-11-25: 1000 mL via INTRAVENOUS

## 2022-11-25 NOTE — Anesthesia Preprocedure Evaluation (Signed)
Anesthesia Evaluation  Patient identified by MRN, date of birth, ID band Patient awake    Reviewed: Allergy & Precautions, H&P , NPO status , Patient's Chart, lab work & pertinent test results, reviewed documented beta blocker date and time   History of Anesthesia Complications (+) Family history of anesthesia reaction  Airway Mallampati: II  TM Distance: >3 FB Neck ROM: full    Dental no notable dental hx.    Pulmonary neg pulmonary ROS, former smoker   Pulmonary exam normal breath sounds clear to auscultation       Cardiovascular Exercise Tolerance: Good hypertension, negative cardio ROS + Valvular Problems/Murmurs  Rhythm:regular Rate:Normal     Neuro/Psych  Headaches PSYCHIATRIC DISORDERS       Neuromuscular disease negative neurological ROS  negative psych ROS   GI/Hepatic negative GI ROS, Neg liver ROS,GERD  ,,  Endo/Other  negative endocrine ROS    Renal/GU negative Renal ROS  negative genitourinary   Musculoskeletal   Abdominal   Peds  Hematology negative hematology ROS (+) Blood dyscrasia, anemia   Anesthesia Other Findings   Reproductive/Obstetrics negative OB ROS                             Anesthesia Physical Anesthesia Plan  ASA: 2  Anesthesia Plan: General   Post-op Pain Management:    Induction:   PONV Risk Score and Plan: Propofol infusion  Airway Management Planned:   Additional Equipment:   Intra-op Plan:   Post-operative Plan:   Informed Consent: I have reviewed the patients History and Physical, chart, labs and discussed the procedure including the risks, benefits and alternatives for the proposed anesthesia with the patient or authorized representative who has indicated his/her understanding and acceptance.     Dental Advisory Given  Plan Discussed with: CRNA  Anesthesia Plan Comments:        Anesthesia Quick Evaluation

## 2022-11-25 NOTE — H&P (Signed)
Shawna Lewis is an 72 y.o. female.   Chief Complaint: colonic stricture HPI: Shawna Lewis is a 72 y.o. female with past medical history of fibromyalgia, hypertension, degenerative disc disease, arthritis, migraine, who presents for follow up of iron deficiency anemia and colonic strictures.   The patient denies having any nausea, vomiting, fever, chills, hematochezia, melena, hematemesis, abdominal distention, abdominal pain, diarrhea, jaundice, pruritus or weight loss.  Past Medical History:  Diagnosis Date   Anemia    Arthritis    DDD (degenerative disc disease), lumbar    Family history of adverse reaction to anesthesia    father had pancreatic cancer received anesthesia with artery replacement and soon as received anesthesia had stroke never knew quite what happened per pt   Fibromyalgia    GERD (gastroesophageal reflux disease)    Heart murmur    Hypertension    Migraine    OCD (obsessive compulsive disorder)    Osteomyelitis (HCC)    Pre-diabetes     Past Surgical History:  Procedure Laterality Date   ANKLE FRACTURE SURGERY  2018   BALLOON DILATION  05/23/2021   Procedure: BALLOON DILATION;  Surgeon: Dolores Frame, MD;  [facility];  Service: Gastroenterology;;  ascending colon strictures   BIOPSY  05/23/2021   Procedure: BIOPSY;  Surgeon: Dolores Frame, MD;  [facility];  Service: Gastroenterology;;   BREAST BIOPSY  1995   CATARACT EXTRACTION     CESAREAN SECTION  1986   COLONOSCOPY WITH PROPOFOL N/A 05/23/2021   Procedure: COLONOSCOPY WITH PROPOFOL;  Surgeon: Dolores Frame, MD;  [facility];  Service: Gastroenterology;  Laterality: N/A;  10:10   ESOPHAGOGASTRODUODENOSCOPY (EGD) WITH PROPOFOL N/A 05/23/2021   Procedure: ESOPHAGOGASTRODUODENOSCOPY (EGD) WITH PROPOFOL;  Surgeon: Dolores Frame, MD;  [facility];  Service: Gastroenterology;  Laterality: N/A;   IR LUMBAR DISC  ASPIRATION W/IMG GUIDE  06/22/2019   KNEE ARTHROSCOPY W/ MENISCAL REPAIR     RADIOFREQUENCY ABLATION NERVES     TONSILLECTOMY  1974   TOTAL KNEE ARTHROPLASTY Right 12/09/2021   Procedure: TOTAL KNEE ARTHROPLASTY;  Surgeon: Sheral Apley, MD;  [facility];  Service: Orthopedics;  Laterality: Right;   TOTAL KNEE ARTHROPLASTY Left 04/07/2022   Procedure: LEFT TOTAL KNEE ARTHROPLASTY;  Surgeon: Sheral Apley, MD;  Location: [facility];  Service: Orthopedics;  Laterality: Left;    Family History  Problem Relation Age of Onset   COPD Mother    Heart disease Mother    Lymphoma Mother    Migraines Mother    Pancreatic cancer Father    Neuropathy Neg Hx    Social History:  reports that she has quit smoking. She has been exposed to tobacco smoke. She has never used smokeless tobacco. She reports that she does not drink alcohol and does not use drugs.  Allergies:  Allergies  Allergen Reactions   Tetracycline Nausea Only   Vancomycin Other (See Comments)    Neutropenia/anemia (after 4 weeks of treatment)    Medications Prior to Admission  Medication Sig Dispense Refill   acetaminophen (TYLENOL) 500 MG tablet Take 2 tablets (1,000 mg total) by mouth every 6 (six) hours as needed for mild pain or moderate pain. 60 tablet 0   acyclovir (ZOVIRAX) 800 MG tablet Take 800 mg by mouth daily as needed (fever blisters).     Bacillus Coagulans-Inulin (PROBIOTIC-PREBIOTIC PO) Take 2 capsules by mouth daily with supper.     baclofen (LIORESAL)  20 MG tablet Take 20 mg by mouth 3 (three) times daily as needed for muscle spasms.     Ca Carbonate-Mag Hydroxide (ROLAIDS) 550-110 MG CHEW Chew 3 tablets by mouth 2 (two) times daily as needed (nausea).     ECHINACEA-GOLDEN SEAL PO Take 1 tablet by mouth daily as needed (immune system support).     felodipine (PLENDIL) 5 MG 24 hr tablet Take 5 mg by mouth every evening.     fluvoxaMINE (LUVOX) 100 MG tablet Take 100 mg by mouth at bedtime.     furosemide  (LASIX) 20 MG tablet Take 20-40 mg by mouth daily as needed for fluid or edema.     hydrALAZINE (APRESOLINE) 25 MG tablet Take 37.5 mg by mouth in the morning.     losartan (COZAAR) 50 MG tablet Take 100 mg by mouth in the morning.     meclizine (ANTIVERT) 12.5 MG tablet Take 12.5 mg by mouth 2 (two) times daily as needed for dizziness.     Omega-3 Fatty Acids (OMEGA 3 500 PO) Take 1,000 mg by mouth daily with supper.     omeprazole (PRILOSEC) 20 MG capsule TAKE ONE CAPSULE (20MG  TOTAL) BY MOUTH DAILY (Patient taking differently: Take 20 mg by mouth daily as needed (indigestion/heartburn.).) 90 capsule 1   ondansetron (ZOFRAN-ODT) 4 MG disintegrating tablet Take 4 mg by mouth daily as needed for nausea or vomiting.     Oxycodone HCl 10 MG TABS Take 10 mg by mouth 6 (six) times daily.     oxymetazoline (AFRIN) 0.05 % nasal spray Place 1 spray into both nostrils 2 (two) times daily.     PHENobarbital (LUMINAL) 64.8 MG tablet Take 64.8 mg by mouth daily as needed (at onset of migraine aura).     phenylephrine (SUDAFED PE) 10 MG TABS tablet Take 10 mg by mouth daily as needed (allergies/sinus issues.).     pramipexole (MIRAPEX) 0.25 MG tablet Take 0.25 mg by mouth at bedtime.     Turmeric (QC TUMERIC COMPLEX PO) Take 2 capsules by mouth daily with supper.     ferrous sulfate 325 (65 FE) MG tablet Take 325 mg by mouth daily with supper.     GAVILYTE-C 240 g solution Take 4,000 mLs by mouth once.      No results found for this or any previous visit (from the past 48 hour(s)). No results found.  Review of Systems  All other systems reviewed and are negative.   Blood pressure (!) 152/57, pulse 78, temperature 98.2 F (36.8 C), temperature source Oral, resp. rate 16, height 5\' 4"  (1.626 m), weight 78 kg, SpO2 95 %. Physical Exam  GENERAL: The patient is AO x3, in no acute distress. HEENT: Head is normocephalic and atraumatic. EOMI are intact. Mouth is well hydrated and without lesions. NECK:  Supple. No masses LUNGS: Clear to auscultation. No presence of rhonchi/wheezing/rales. Adequate chest expansion HEART: RRR, normal s1 and s2. ABDOMEN: Soft, nontender, no guarding, no peritoneal signs, and nondistended. BS +. No masses. EXTREMITIES: Without any cyanosis, clubbing, rash, lesions or edema. NEUROLOGIC: AOx3, no focal motor deficit. SKIN: no jaundice, no rashes  Assessment/Plan Shawna Lewis is a 72 y.o. female with past medical history of fibromyalgia, hypertension, degenerative disc disease, arthritis, migraine, who presents for follow up of iron deficiency anemia and colonic strictures. Will proceed with colonoscopy.  Dolores Frame, MD 11/25/2022, 11:43 AM

## 2022-11-25 NOTE — Transfer of Care (Signed)
Immediate Anesthesia Transfer of Care Note  Patient: Shawna Lewis  Procedure(s) Performed: COLONOSCOPY WITH PROPOFOL BIOPSY  Patient Location: PACU  Anesthesia Type:General  Level of Consciousness: awake and patient cooperative  Airway & Oxygen Therapy: Patient Spontanous Breathing and Patient connected to nasal cannula oxygen  Post-op Assessment: Report given to RN and Post -op Vital signs reviewed and stable  Post vital signs: Reviewed and stable  Last Vitals:  Vitals Value Taken Time  BP    Temp    Pulse    Resp    SpO2      Last Pain:  Vitals:   11/25/22 1144  TempSrc:   PainSc: 0-No pain      Patients Stated Pain Goal: 4 (11/25/22 1053)  Complications: No notable events documented.

## 2022-11-25 NOTE — Discharge Instructions (Addendum)
You are being discharged to home.  Resume your previous diet.  We are waiting for your pathology results.  Your physician has recommended a repeat colonoscopy in five years for surveillance.  

## 2022-11-25 NOTE — Op Note (Signed)
[facility] Patient Name: Shawna Lewis Procedure Date: 11/25/2022 11:32 AM MRN: [id number] Date of Birth: 1950-11-18 Attending MD: Katrinka Blazing , , [id number] CSN: [id number] Age: 72 Admit Type: Outpatient Procedure:                Colonoscopy Indications:              History of colonic strictures Providers:                Katrinka Blazing, Sheran Fava, Lennice Sites                            Technician, Technician Referring MD:              Medicines:                Monitored Anesthesia Care Complications:            No immediate complications. Estimated Blood Loss:     Estimated blood loss: none. Procedure:                Pre-Anesthesia Assessment:                           - Prior to the procedure, a History and Physical                            was performed, and patient medications, allergies                            and sensitivities were reviewed. The patient's                            tolerance of previous anesthesia was reviewed.                           - The risks and benefits of the procedure and the                            sedation options and risks were discussed with the                            patient. All questions were answered and informed                            consent was obtained.                           - ASA Grade Assessment: II - A patient with mild                            systemic disease.                           After obtaining informed consent, the colonoscope                            was passed under direct vision. Throughout the  procedure, the patient's blood pressure, pulse, and                            oxygen saturations were monitored continuously. The                            [id number]-HQ190L ([id number]) scope was introduced through                            the anus and advanced to the the cecum, identified                            by appendiceal orifice and ileocecal valve. The                             colonoscopy was performed without difficulty. The                            patient tolerated the procedure well. The quality                            of the bowel preparation was good. Scope In: 11:49:50 AM Scope Out: 12:07:45 PM Scope Withdrawal Time: 0 hours 12 minutes 53 seconds  Total Procedure Duration: 0 hours 17 minutes 55 seconds  Findings:      The perianal and digital rectal examinations were normal.      Two benign-appearing, intrinsic mild stenoses measuring less than one cm       (in length) x 1.8 cm (inner diameter) was found in the proximal       ascending colon and was traversed. One of the stenosis was       hemicircumferential. No adenomatous features or masses were seen.       Biopsies were taken with a cold forceps for histology.      Scattered large-mouthed and small-mouthed diverticula were found in the       entire colon.      A few five mm ulcers were found in the distal descending colon. No       bleeding was present. Biopsies were taken with a cold forceps for       histology.      The retroflexed view of the distal rectum and anal verge was normal and       showed no anal or rectal abnormalities. 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                            Biopsied.                           - Diverticulosis in the entire examined colon.                           - A few ulcers in the distal descending colon.  Biopsied.                           - The distal rectum and anal verge are normal on                            retroflexion view. Moderate Sedation:      Per Anesthesia Care Recommendation:           - Discharge patient to home (ambulatory).                           - Resume previous diet.                           - Await pathology results.                           - Repeat colonoscopy in 5 years for surveillance. Procedure Code(s):        --- Professional  ---                           [phone], Colonoscopy, flexible; with biopsy, single                            or multiple Diagnosis Code(s):        --- Professional ---                           [phone], Other intestinal obstruction unspecified                            as to partial versus complete obstruction                           K63.3, Ulcer of intestine                           K57.30, Diverticulosis of large intestine without                            perforation or abscess without bleeding CPT copyright 2022 [employer]. All rights reserved. The codes documented in this report are preliminary and upon coder review may  be revised to meet current compliance requirements. Katrinka Blazing, MD Katrinka Blazing,  11/25/2022 12:16:22 PM This report has been signed electronically. Number of Addenda: 0

## 2022-11-26 ENCOUNTER — Encounter (INDEPENDENT_AMBULATORY_CARE_PROVIDER_SITE_OTHER): Payer: Self-pay | Admitting: *Deleted

## 2022-11-27 NOTE — Anesthesia Postprocedure Evaluation (Signed)
Anesthesia Post Note  Patient: Shawna Lewis  Procedure(s) Performed: COLONOSCOPY WITH PROPOFOL BIOPSY  Patient location during evaluation: Phase II Anesthesia Type: General Level of consciousness: awake Pain management: pain level controlled Vital Signs Assessment: post-procedure vital signs reviewed and stable Respiratory status: spontaneous breathing and respiratory function stable Cardiovascular status: blood pressure returned to baseline and stable Postop Assessment: no headache and no apparent nausea or vomiting Anesthetic complications: no Comments: Late entry   No notable events documented.   Last Vitals:  Vitals:   11/25/22 1053 11/25/22 1212  BP: (!) 152/57 121/70  Pulse: 78 77  Resp: 16 10  Temp: 36.8 C 36.7 C  SpO2: 95% 100%    Last Pain:  Vitals:   11/25/22 1212  TempSrc: Oral  PainSc: 0-No pain                 Windell Norfolk

## 2022-11-30 ENCOUNTER — Telehealth (INDEPENDENT_AMBULATORY_CARE_PROVIDER_SITE_OTHER): Payer: Self-pay | Admitting: *Deleted

## 2022-11-30 NOTE — Telephone Encounter (Signed)
Pt called for pathology results. Had tcs on 11/25/22. I let her know it was still pending and we would let her know when its resulted.

## 2022-12-01 LAB — SURGICAL PATHOLOGY

## 2022-12-01 NOTE — Telephone Encounter (Signed)
Resulted today and Dr. Levon Hedger did discuss with patient. See result note.

## 2022-12-02 ENCOUNTER — Encounter (HOSPITAL_COMMUNITY): Payer: Self-pay | Admitting: Gastroenterology

## 2022-12-07 DIAGNOSIS — Z6832 Body mass index (BMI) 32.0-32.9, adult: Secondary | ICD-10-CM | POA: Diagnosis not present

## 2022-12-07 DIAGNOSIS — M1990 Unspecified osteoarthritis, unspecified site: Secondary | ICD-10-CM | POA: Diagnosis not present

## 2022-12-07 DIAGNOSIS — Z79899 Other long term (current) drug therapy: Secondary | ICD-10-CM | POA: Diagnosis not present

## 2022-12-07 DIAGNOSIS — M545 Low back pain, unspecified: Secondary | ICD-10-CM | POA: Diagnosis not present

## 2022-12-07 DIAGNOSIS — R030 Elevated blood-pressure reading, without diagnosis of hypertension: Secondary | ICD-10-CM | POA: Diagnosis not present

## 2022-12-07 DIAGNOSIS — G8929 Other chronic pain: Secondary | ICD-10-CM | POA: Diagnosis not present

## 2022-12-17 ENCOUNTER — Encounter (INDEPENDENT_AMBULATORY_CARE_PROVIDER_SITE_OTHER): Payer: Self-pay

## 2022-12-30 ENCOUNTER — Other Ambulatory Visit (HOSPITAL_COMMUNITY): Payer: Self-pay | Admitting: Nurse Practitioner

## 2022-12-30 ENCOUNTER — Ambulatory Visit (HOSPITAL_COMMUNITY)
Admission: RE | Admit: 2022-12-30 | Discharge: 2022-12-30 | Disposition: A | Discharge status: Home / Self Care | Payer: Medicare PPO | Source: Ambulatory Visit | Attending: Nurse Practitioner | Admitting: Nurse Practitioner

## 2022-12-30 DIAGNOSIS — M545 Low back pain, unspecified: Secondary | ICD-10-CM | POA: Insufficient documentation

## 2022-12-30 DIAGNOSIS — G8929 Other chronic pain: Secondary | ICD-10-CM | POA: Diagnosis not present

## 2023-01-04 DIAGNOSIS — G894 Chronic pain syndrome: Secondary | ICD-10-CM | POA: Diagnosis not present

## 2023-01-04 DIAGNOSIS — R030 Elevated blood-pressure reading, without diagnosis of hypertension: Secondary | ICD-10-CM | POA: Diagnosis not present

## 2023-01-04 DIAGNOSIS — M545 Low back pain, unspecified: Secondary | ICD-10-CM | POA: Diagnosis not present

## 2023-01-04 DIAGNOSIS — M1990 Unspecified osteoarthritis, unspecified site: Secondary | ICD-10-CM | POA: Diagnosis not present

## 2023-01-04 DIAGNOSIS — Z79899 Other long term (current) drug therapy: Secondary | ICD-10-CM | POA: Diagnosis not present

## 2023-01-04 DIAGNOSIS — Z6831 Body mass index (BMI) 31.0-31.9, adult: Secondary | ICD-10-CM | POA: Diagnosis not present

## 2023-01-18 DIAGNOSIS — R7303 Prediabetes: Secondary | ICD-10-CM | POA: Diagnosis not present

## 2023-01-18 DIAGNOSIS — I10 Essential (primary) hypertension: Secondary | ICD-10-CM | POA: Diagnosis not present

## 2023-01-18 DIAGNOSIS — D649 Anemia, unspecified: Secondary | ICD-10-CM | POA: Diagnosis not present

## 2023-01-26 ENCOUNTER — Other Ambulatory Visit (HOSPITAL_COMMUNITY): Payer: Self-pay | Admitting: Family Medicine

## 2023-01-26 DIAGNOSIS — I10 Essential (primary) hypertension: Secondary | ICD-10-CM | POA: Diagnosis not present

## 2023-01-26 DIAGNOSIS — R7303 Prediabetes: Secondary | ICD-10-CM | POA: Diagnosis not present

## 2023-01-26 DIAGNOSIS — Z0000 Encounter for general adult medical examination without abnormal findings: Secondary | ICD-10-CM | POA: Diagnosis not present

## 2023-01-26 DIAGNOSIS — D649 Anemia, unspecified: Secondary | ICD-10-CM | POA: Diagnosis not present

## 2023-01-26 DIAGNOSIS — G43909 Migraine, unspecified, not intractable, without status migrainosus: Secondary | ICD-10-CM | POA: Diagnosis not present

## 2023-01-26 DIAGNOSIS — R600 Localized edema: Secondary | ICD-10-CM | POA: Diagnosis not present

## 2023-01-26 DIAGNOSIS — E782 Mixed hyperlipidemia: Secondary | ICD-10-CM | POA: Diagnosis not present

## 2023-01-26 DIAGNOSIS — G8929 Other chronic pain: Secondary | ICD-10-CM | POA: Diagnosis not present

## 2023-01-26 DIAGNOSIS — Z1231 Encounter for screening mammogram for malignant neoplasm of breast: Secondary | ICD-10-CM

## 2023-01-26 DIAGNOSIS — M549 Dorsalgia, unspecified: Secondary | ICD-10-CM | POA: Diagnosis not present

## 2023-01-29 ENCOUNTER — Ambulatory Visit: Payer: Medicare PPO | Admitting: Internal Medicine

## 2023-01-29 ENCOUNTER — Encounter: Payer: Self-pay | Admitting: Internal Medicine

## 2023-01-29 VITALS — BP 162/84 | HR 72 | Ht 62.0 in | Wt 188.8 lb

## 2023-01-29 DIAGNOSIS — I351 Nonrheumatic aortic (valve) insufficiency: Secondary | ICD-10-CM | POA: Diagnosis not present

## 2023-01-29 DIAGNOSIS — R011 Cardiac murmur, unspecified: Secondary | ICD-10-CM

## 2023-01-29 NOTE — Patient Instructions (Addendum)
Medication Instructions:  Your physician recommends that you continue on your current medications as directed. Please refer to the Current Medication list given to you today.   Labwork: None  Testing/Procedures: Your physician has requested that you have an echocardiogram. Echocardiography is a painless test that uses sound waves to create images of your heart. It provides your doctor with information about the size and shape of your heart and how well your heart's chambers and valves are working. This procedure takes approximately one hour. There are no restrictions for this procedure. Please do NOT wear cologne, perfume, aftershave, or lotions (deodorant is allowed). Please arrive 15 minutes prior to your appointment time.   Follow-Up: Your physician recommends that you schedule a follow-up appointment in: 2 years. You will receive a reminder call in the mail in about 18 months reminding you to call and schedule your appointment. If you don't receive this call, please contact our office.   Any Other Special Instructions Will Be Listed Below (If Applicable).  If you need a refill on your cardiac medications before your next appointment, please call your pharmacy.

## 2023-01-29 NOTE — Progress Notes (Signed)
Cardiology Office Note  Date: 01/29/2023   ID: Shawna, Lewis 01/01/1951, MRN [id number]  PCP:  Shawna Stabile, MD  Cardiologist:  Shawna Bicker, MD Electrophysiologist:  None   Reason for Office Visit: Cardiac murmur evaluation   History of Present Illness: Shawna Lewis is a 72 y.o. female known to have HTN, mild aortic valve regurgitation in 2020 was referred to cardiology clinic for evaluation of cardiac murmur.   Patient underwent echocardiogram in 2020 that showed normal LVEF, mild aortic valve regurgitation.  She has no symptoms of angina, DOE, orthopnea, PND, leg swelling, dizziness, syncope or palpitations.  No prior history of MI/PCI/CABG.  No prior ischemia evaluation.  Past Medical History:  Diagnosis Date   Anemia    Arthritis    DDD (degenerative disc disease), lumbar    Family history of adverse reaction to anesthesia    father had pancreatic cancer received anesthesia with artery replacement and soon as received anesthesia had stroke never knew quite what happened per pt   Fibromyalgia    GERD (gastroesophageal reflux disease)    Heart murmur    Hypertension    Migraine    OCD (obsessive compulsive disorder)    Osteomyelitis (HCC)    Pre-diabetes     Past Surgical History:  Procedure Laterality Date   ANKLE FRACTURE SURGERY  2018   BALLOON DILATION  05/23/2021   Procedure: BALLOON DILATION;  Surgeon: Shawna Frame, MD;  Location: [facility];  Service: Gastroenterology;;  ascending colon strictures   BIOPSY  05/23/2021   Procedure: BIOPSY;  Surgeon: Shawna Frame, MD;  Location: [facility];  Service: Gastroenterology;;   BIOPSY  11/25/2022   Procedure: BIOPSY;  Surgeon: Shawna Frame, MD;  Location: [facility];  Service: Gastroenterology;;   BREAST BIOPSY  1995   CATARACT EXTRACTION     CESAREAN SECTION  1986   COLONOSCOPY WITH PROPOFOL N/A 05/23/2021   Procedure: COLONOSCOPY WITH PROPOFOL;   Surgeon: Shawna Frame, MD;  Location: [facility];  Service: Gastroenterology;  Laterality: N/A;  10:10   COLONOSCOPY WITH PROPOFOL N/A 11/25/2022   Procedure: COLONOSCOPY WITH PROPOFOL;  Surgeon: Shawna Frame, MD;  Location: [facility];  Service: Gastroenterology;  Laterality: N/A;  11:45am;asa 1   ESOPHAGOGASTRODUODENOSCOPY (EGD) WITH PROPOFOL N/A 05/23/2021   Procedure: ESOPHAGOGASTRODUODENOSCOPY (EGD) WITH PROPOFOL;  Surgeon: Shawna Frame, MD;  Location: [facility];  Service: Gastroenterology;  Laterality: N/A;   IR LUMBAR DISC ASPIRATION W/IMG GUIDE  06/22/2019   KNEE ARTHROSCOPY W/ MENISCAL REPAIR     RADIOFREQUENCY ABLATION NERVES     TONSILLECTOMY  1974   TOTAL KNEE ARTHROPLASTY Right 12/09/2021   Procedure: TOTAL KNEE ARTHROPLASTY;  Surgeon: Shawna Apley, MD;  Location: [facility];  Service: Orthopedics;  Laterality: Right;   TOTAL KNEE ARTHROPLASTY Left 04/07/2022   Procedure: LEFT TOTAL KNEE ARTHROPLASTY;  Surgeon: Shawna Apley, MD;  Location: [facility];  Service: Orthopedics;  Laterality: Left;    Current Outpatient Medications  Medication Sig Dispense Refill   acetaminophen (TYLENOL) 500 MG tablet Take 2 tablets (1,000 mg total) by mouth every 6 (six) hours as needed for mild pain or moderate pain. 60 tablet 0   acyclovir (ZOVIRAX) 800 MG tablet Take 800 mg by mouth daily as needed (fever blisters).     Bacillus Coagulans-Inulin (PROBIOTIC-PREBIOTIC PO) Take 2 capsules by mouth daily with supper.     baclofen (LIORESAL) 20 MG tablet Take  20 mg by mouth 3 (three) times daily as needed for muscle spasms.     Ca Carbonate-Mag Hydroxide (ROLAIDS) 550-110 MG CHEW Chew 3 tablets by mouth 2 (two) times daily as needed (nausea).     ECHINACEA-GOLDEN SEAL PO Take 1 tablet by mouth daily as needed (immune system support).     felodipine (PLENDIL) 5 MG 24 hr tablet Take 5 mg by mouth every evening.     ferrous sulfate 325 (65 FE) MG tablet Take  325 mg by mouth daily with supper.     fluvoxaMINE (LUVOX) 100 MG tablet Take 100 mg by mouth at bedtime.     furosemide (LASIX) 20 MG tablet Take 20-40 mg by mouth daily as needed for fluid or edema.     hydrALAZINE (APRESOLINE) 25 MG tablet Take 37.5 mg by mouth in the morning.     losartan (COZAAR) 50 MG tablet Take 100 mg by mouth in the morning.     meclizine (ANTIVERT) 12.5 MG tablet Take 12.5 mg by mouth 2 (two) times daily as needed for dizziness.     Omega-3 Fatty Acids (OMEGA 3 500 PO) Take 1,000 mg by mouth daily with supper.     omeprazole (PRILOSEC) 20 MG capsule TAKE ONE CAPSULE (20MG  TOTAL) BY MOUTH DAILY (Patient taking differently: Take 20 mg by mouth daily as needed (indigestion/heartburn.).) 90 capsule 1   ondansetron (ZOFRAN-ODT) 4 MG disintegrating tablet Take 4 mg by mouth daily as needed for nausea or vomiting.     Oxycodone HCl 10 MG TABS Take 10 mg by mouth 6 (six) times daily.     oxymetazoline (AFRIN) 0.05 % nasal spray Place 1 spray into both nostrils 2 (two) times daily.     PHENobarbital (LUMINAL) 64.8 MG tablet Take 64.8 mg by mouth daily as needed (at onset of migraine aura).     phenylephrine (SUDAFED PE) 10 MG TABS tablet Take 10 mg by mouth daily as needed (allergies/sinus issues.).     pramipexole (MIRAPEX) 0.25 MG tablet Take 0.25 mg by mouth at bedtime.     Turmeric (QC TUMERIC COMPLEX PO) Take 2 capsules by mouth daily with supper.     No current facility-administered medications for this visit.   Allergies:  Tetracycline and Vancomycin   Social History: The patient  reports that she has quit smoking. She has been exposed to tobacco smoke. She has never used smokeless tobacco. She reports that she does not drink alcohol and does not use drugs.   Family History: The patient's family history includes COPD in her mother; Heart disease in her mother; Lymphoma in her mother; Migraines in her mother; Pancreatic cancer in her father.   ROS:  Please see the  history of present illness. Otherwise, complete review of systems is positive for none  All other systems are reviewed and negative.   Physical Exam: VS:  BP (!) 162/84 (BP Location: Right Arm, Patient Position: Sitting, Cuff Size: Normal)   Pulse 72   Ht 5\' 2"  (1.575 m)   Wt 188 lb 12.8 oz (85.6 kg)   SpO2 96%   BMI 34.53 kg/m , BMI Body mass index is 34.53 kg/m.  Wt Readings from Last 3 Encounters:  01/29/23 188 lb 12.8 oz (85.6 kg)  11/25/22 172 lb (78 kg)  10/22/22 183 lb 6.4 oz (83.2 kg)    General: Patient appears comfortable at rest. HEENT: Conjunctiva and lids normal, oropharynx clear with moist mucosa. Neck: Supple, no elevated JVP or carotid bruits, no  thyromegaly. Lungs: Clear to auscultation, nonlabored breathing at rest. Cardiac: Regular rate and rhythm, systolic and diastolic murmurs heard. Abdomen: Soft, nontender, no hepatomegaly, bowel sounds present, no guarding or rebound. Extremities: No pitting edema, distal pulses 2+. Skin: Warm and dry. Musculoskeletal: No kyphosis. Neuropsychiatric: Alert and oriented x3, affect grossly appropriate.  Recent Labwork: 03/25/2022: Hemoglobin 12.5; Platelets 178 11/19/2022: BUN 13; Creatinine, Ser 0.56; Potassium 4.3; Sodium 138  No results found for: "CHOL", "TRIG", "HDL", "CHOLHDL", "VLDL", "LDLCALC", "LDLDIRECT"   Assessment and Plan:  # Mild aortic valve regurgitation in 2020. -Systolic and diastolic murmurs heard.  Repeat 2D echocardiogram.  # HTN, controlled # [place] HTN -Home blood pressures range between 120 and 130 mmHg SBP.  Continue current antihypertensives, hydralazine 37.5 mg once daily, losartan 100 mg once daily, felodipine 5 mg once daily.  Follows with PCP for HTN management.   I have spent a total of 45 minutes with patient reviewing chart, EKGs, labs and examining patient as well as establishing an assessment and plan that was discussed with the patient.  > 50% of time was spent in direct patient  care.    Medication Adjustments/Labs and Tests Ordered: Current medicines are reviewed at length with the patient today.  Concerns regarding medicines are outlined above.   Tests Ordered: Orders Placed This Encounter  Procedures   EKG 12-Lead   ECHOCARDIOGRAM COMPLETE    Medication Changes: No orders of the defined types were placed in this encounter.   Disposition:  Follow up  2 years  Signed Janann Boeve Verne Spurr, MD, 01/29/2023 11:51 AM    [facility] [address] [place], [place], [place] [address]

## 2023-02-01 DIAGNOSIS — E6609 Other obesity due to excess calories: Secondary | ICD-10-CM | POA: Diagnosis not present

## 2023-02-01 DIAGNOSIS — M545 Low back pain, unspecified: Secondary | ICD-10-CM | POA: Diagnosis not present

## 2023-02-01 DIAGNOSIS — Z6832 Body mass index (BMI) 32.0-32.9, adult: Secondary | ICD-10-CM | POA: Diagnosis not present

## 2023-02-01 DIAGNOSIS — Z79899 Other long term (current) drug therapy: Secondary | ICD-10-CM | POA: Diagnosis not present

## 2023-02-01 DIAGNOSIS — M1990 Unspecified osteoarthritis, unspecified site: Secondary | ICD-10-CM | POA: Diagnosis not present

## 2023-02-01 DIAGNOSIS — R030 Elevated blood-pressure reading, without diagnosis of hypertension: Secondary | ICD-10-CM | POA: Diagnosis not present

## 2023-02-03 ENCOUNTER — Ambulatory Visit: Payer: Medicare PPO | Attending: Internal Medicine

## 2023-02-03 DIAGNOSIS — I351 Nonrheumatic aortic (valve) insufficiency: Secondary | ICD-10-CM

## 2023-02-04 ENCOUNTER — Encounter: Payer: Self-pay | Admitting: *Deleted

## 2023-02-04 ENCOUNTER — Telehealth: Payer: Self-pay | Admitting: Internal Medicine

## 2023-02-04 LAB — ECHOCARDIOGRAM COMPLETE
AR max vel: 1.77 cm2
AV Area VTI: 1.85 cm2
AV Area mean vel: 1.8 cm2
AV Mean grad: 12 mmHg
AV Peak grad: 20.1 mmHg
AV Vena cont: 0.4 cm
Ao pk vel: 2.24 m/s
Area-P 1/2: 5.02 cm2
Calc EF: 66 %
MV M vel: 5.43 m/s
MV Peak grad: 117.9 mmHg
P 1/2 time: 415 msec
S' Lateral: 2.6 cm
Single Plane A2C EF: 67 %
Single Plane A4C EF: 61.2 %

## 2023-02-04 NOTE — Telephone Encounter (Signed)
Replied to patient via mychart

## 2023-02-04 NOTE — Telephone Encounter (Signed)
Patient is requesting a call back to discuss echo results. 

## 2023-02-15 ENCOUNTER — Encounter (HOSPITAL_COMMUNITY): Payer: Self-pay

## 2023-02-15 ENCOUNTER — Ambulatory Visit (HOSPITAL_COMMUNITY)
Admission: RE | Admit: 2023-02-15 | Discharge: 2023-02-15 | Disposition: A | Discharge status: Home / Self Care | Payer: Medicare PPO | Source: Ambulatory Visit | Attending: Family Medicine | Admitting: Family Medicine

## 2023-02-15 DIAGNOSIS — Z1231 Encounter for screening mammogram for malignant neoplasm of breast: Secondary | ICD-10-CM | POA: Insufficient documentation

## 2023-02-18 ENCOUNTER — Telehealth: Payer: Self-pay | Admitting: Internal Medicine

## 2023-02-18 NOTE — Telephone Encounter (Signed)
Results have not been viewed will call back when read

## 2023-02-18 NOTE — Telephone Encounter (Signed)
Pt would like a callback regarding Echo results. Please advise

## 2023-03-03 ENCOUNTER — Telehealth: Payer: Self-pay

## 2023-03-03 NOTE — Telephone Encounter (Signed)
Patient informed, stated that she has Lasix on hand already. Verbalized understanding and sent copy to PCP

## 2023-03-03 NOTE — Telephone Encounter (Signed)
-----   Message from Vishnu P Mallipeddi sent at 03/02/2023  5:27 PM EDT ----- Normal pumping function of the heart, mild AR (leakiness across the aortic valve), mild AS (tightness of the aortic valve).  CVP 15 mmHg, volume overload based on echocardiogram.  Can start p.o. Lasix 20 mg as needed for SOB/LE swelling.

## 2023-03-15 DIAGNOSIS — E6609 Other obesity due to excess calories: Secondary | ICD-10-CM | POA: Diagnosis not present

## 2023-03-15 DIAGNOSIS — Z79899 Other long term (current) drug therapy: Secondary | ICD-10-CM | POA: Diagnosis not present

## 2023-03-15 DIAGNOSIS — M1990 Unspecified osteoarthritis, unspecified site: Secondary | ICD-10-CM | POA: Diagnosis not present

## 2023-03-15 DIAGNOSIS — Z6832 Body mass index (BMI) 32.0-32.9, adult: Secondary | ICD-10-CM | POA: Diagnosis not present

## 2023-03-15 DIAGNOSIS — R030 Elevated blood-pressure reading, without diagnosis of hypertension: Secondary | ICD-10-CM | POA: Diagnosis not present

## 2023-03-15 DIAGNOSIS — M545 Low back pain, unspecified: Secondary | ICD-10-CM | POA: Diagnosis not present

## 2023-04-01 DIAGNOSIS — Z23 Encounter for immunization: Secondary | ICD-10-CM | POA: Diagnosis not present

## 2023-08-27 DIAGNOSIS — R7303 Prediabetes: Secondary | ICD-10-CM | POA: Diagnosis not present

## 2023-08-27 DIAGNOSIS — I10 Essential (primary) hypertension: Secondary | ICD-10-CM | POA: Diagnosis not present

## 2023-08-27 DIAGNOSIS — D649 Anemia, unspecified: Secondary | ICD-10-CM | POA: Diagnosis not present

## 2023-08-31 DIAGNOSIS — G8929 Other chronic pain: Secondary | ICD-10-CM | POA: Diagnosis not present

## 2023-08-31 DIAGNOSIS — R600 Localized edema: Secondary | ICD-10-CM | POA: Diagnosis not present

## 2023-08-31 DIAGNOSIS — E782 Mixed hyperlipidemia: Secondary | ICD-10-CM | POA: Diagnosis not present

## 2023-08-31 DIAGNOSIS — I10 Essential (primary) hypertension: Secondary | ICD-10-CM | POA: Diagnosis not present

## 2023-08-31 DIAGNOSIS — Z0000 Encounter for general adult medical examination without abnormal findings: Secondary | ICD-10-CM | POA: Diagnosis not present

## 2023-08-31 DIAGNOSIS — R7303 Prediabetes: Secondary | ICD-10-CM | POA: Diagnosis not present

## 2023-08-31 DIAGNOSIS — D649 Anemia, unspecified: Secondary | ICD-10-CM | POA: Diagnosis not present

## 2023-08-31 DIAGNOSIS — M25562 Pain in left knee: Secondary | ICD-10-CM | POA: Diagnosis not present

## 2023-08-31 DIAGNOSIS — M25561 Pain in right knee: Secondary | ICD-10-CM | POA: Diagnosis not present

## 2024-02-24 DIAGNOSIS — R7303 Prediabetes: Secondary | ICD-10-CM | POA: Diagnosis not present

## 2024-02-24 DIAGNOSIS — I10 Essential (primary) hypertension: Secondary | ICD-10-CM | POA: Diagnosis not present

## 2024-03-01 DIAGNOSIS — I10 Essential (primary) hypertension: Secondary | ICD-10-CM | POA: Diagnosis not present

## 2024-03-01 DIAGNOSIS — G894 Chronic pain syndrome: Secondary | ICD-10-CM | POA: Diagnosis not present

## 2024-03-01 DIAGNOSIS — M25561 Pain in right knee: Secondary | ICD-10-CM | POA: Diagnosis not present

## 2024-03-01 DIAGNOSIS — D649 Anemia, unspecified: Secondary | ICD-10-CM | POA: Diagnosis not present

## 2024-03-01 DIAGNOSIS — R600 Localized edema: Secondary | ICD-10-CM | POA: Diagnosis not present

## 2024-03-01 DIAGNOSIS — R7303 Prediabetes: Secondary | ICD-10-CM | POA: Diagnosis not present

## 2024-03-01 DIAGNOSIS — E782 Mixed hyperlipidemia: Secondary | ICD-10-CM | POA: Diagnosis not present

## 2024-03-01 DIAGNOSIS — G8929 Other chronic pain: Secondary | ICD-10-CM | POA: Diagnosis not present

## 2024-03-01 DIAGNOSIS — K219 Gastro-esophageal reflux disease without esophagitis: Secondary | ICD-10-CM | POA: Diagnosis not present

## 2024-05-03 ENCOUNTER — Encounter (INDEPENDENT_AMBULATORY_CARE_PROVIDER_SITE_OTHER): Payer: Self-pay | Admitting: Gastroenterology
